# Patient Record
Sex: Female | Born: 1955
Health system: Southern US, Community
[De-identification: ages and names within clinical notes are randomized; demographics above are authoritative.]

## PROBLEM LIST (undated history)

## (undated) DIAGNOSIS — E785 Hyperlipidemia, unspecified: Secondary | ICD-10-CM

## (undated) DIAGNOSIS — M1990 Unspecified osteoarthritis, unspecified site: Secondary | ICD-10-CM

## (undated) DIAGNOSIS — J45909 Unspecified asthma, uncomplicated: Secondary | ICD-10-CM

## (undated) DIAGNOSIS — I10 Essential (primary) hypertension: Secondary | ICD-10-CM

## (undated) DIAGNOSIS — J309 Allergic rhinitis, unspecified: Secondary | ICD-10-CM

## (undated) DIAGNOSIS — K219 Gastro-esophageal reflux disease without esophagitis: Secondary | ICD-10-CM

## (undated) HISTORY — PX: MYOMECTOMY: SHX85

## (undated) HISTORY — DX: Allergic rhinitis, unspecified: J30.9

## (undated) HISTORY — DX: Unspecified asthma, uncomplicated: J45.909

## (undated) HISTORY — DX: Hyperlipidemia, unspecified: E78.5

## (undated) HISTORY — PX: TONSILLECTOMY: SUR1361

## (undated) HISTORY — PX: TEMPOROMANDIBULAR JOINT SURGERY: SHX35

## (undated) HISTORY — PX: APPENDECTOMY: SHX54

## (undated) HISTORY — DX: Unspecified osteoarthritis, unspecified site: M19.90

---

## 1998-09-15 ENCOUNTER — Other Ambulatory Visit: Admission: RE | Admit: 1998-09-15 | Discharge: 1998-09-15 | Payer: Self-pay | Admitting: Obstetrics and Gynecology

## 1998-11-30 ENCOUNTER — Encounter (INDEPENDENT_AMBULATORY_CARE_PROVIDER_SITE_OTHER): Payer: Self-pay | Admitting: Specialist

## 1998-11-30 ENCOUNTER — Other Ambulatory Visit: Admission: RE | Admit: 1998-11-30 | Discharge: 1998-11-30 | Payer: Self-pay | Admitting: Obstetrics and Gynecology

## 1999-11-17 ENCOUNTER — Other Ambulatory Visit: Admission: RE | Admit: 1999-11-17 | Discharge: 1999-11-17 | Payer: Self-pay | Admitting: *Deleted

## 2000-05-28 ENCOUNTER — Encounter: Payer: Self-pay | Admitting: *Deleted

## 2000-05-28 ENCOUNTER — Encounter: Admission: RE | Admit: 2000-05-28 | Discharge: 2000-05-28 | Payer: Self-pay | Admitting: *Deleted

## 2000-10-01 ENCOUNTER — Encounter: Admission: RE | Admit: 2000-10-01 | Discharge: 2000-10-01 | Payer: Self-pay | Admitting: *Deleted

## 2000-10-01 ENCOUNTER — Encounter: Payer: Self-pay | Admitting: *Deleted

## 2000-10-07 ENCOUNTER — Encounter: Admission: RE | Admit: 2000-10-07 | Discharge: 2000-10-07 | Payer: Self-pay | Admitting: *Deleted

## 2000-10-07 ENCOUNTER — Encounter: Payer: Self-pay | Admitting: *Deleted

## 2001-01-03 ENCOUNTER — Other Ambulatory Visit: Admission: RE | Admit: 2001-01-03 | Discharge: 2001-01-03 | Payer: Self-pay | Admitting: Obstetrics and Gynecology

## 2001-07-16 ENCOUNTER — Ambulatory Visit (HOSPITAL_COMMUNITY): Admission: RE | Admit: 2001-07-16 | Discharge: 2001-07-16 | Payer: Self-pay | Admitting: Obstetrics and Gynecology

## 2001-07-16 ENCOUNTER — Encounter (INDEPENDENT_AMBULATORY_CARE_PROVIDER_SITE_OTHER): Payer: Self-pay

## 2002-07-31 ENCOUNTER — Encounter: Admission: RE | Admit: 2002-07-31 | Discharge: 2002-07-31 | Payer: Self-pay | Admitting: Family Medicine

## 2002-07-31 ENCOUNTER — Encounter: Payer: Self-pay | Admitting: Family Medicine

## 2003-07-02 ENCOUNTER — Encounter: Admission: RE | Admit: 2003-07-02 | Discharge: 2003-07-02 | Payer: Self-pay | Admitting: Family Medicine

## 2004-02-22 ENCOUNTER — Encounter: Admission: RE | Admit: 2004-02-22 | Discharge: 2004-02-22 | Payer: Self-pay | Admitting: Family Medicine

## 2004-02-24 ENCOUNTER — Encounter: Admission: RE | Admit: 2004-02-24 | Discharge: 2004-02-24 | Payer: Self-pay | Admitting: Family Medicine

## 2004-05-16 ENCOUNTER — Encounter: Admission: RE | Admit: 2004-05-16 | Discharge: 2004-05-16 | Payer: Self-pay | Admitting: Neurology

## 2004-06-02 ENCOUNTER — Encounter: Admission: RE | Admit: 2004-06-02 | Discharge: 2004-06-02 | Payer: Self-pay | Admitting: Allergy and Immunology

## 2005-04-18 ENCOUNTER — Encounter: Admission: RE | Admit: 2005-04-18 | Discharge: 2005-04-18 | Payer: Self-pay | Admitting: Family Medicine

## 2005-05-16 ENCOUNTER — Encounter: Admission: RE | Admit: 2005-05-16 | Discharge: 2005-05-16 | Payer: Self-pay | Admitting: Family Medicine

## 2006-05-24 ENCOUNTER — Encounter: Admission: RE | Admit: 2006-05-24 | Discharge: 2006-05-24 | Payer: Self-pay | Admitting: Family Medicine

## 2007-10-06 ENCOUNTER — Encounter: Admission: RE | Admit: 2007-10-06 | Discharge: 2007-10-06 | Payer: Self-pay | Admitting: Family Medicine

## 2008-05-17 ENCOUNTER — Encounter: Admission: RE | Admit: 2008-05-17 | Discharge: 2008-05-17 | Payer: Self-pay | Admitting: Orthopaedic Surgery

## 2008-10-14 ENCOUNTER — Other Ambulatory Visit: Admission: RE | Admit: 2008-10-14 | Discharge: 2008-10-14 | Payer: Self-pay | Admitting: Family Medicine

## 2008-11-08 ENCOUNTER — Encounter: Admission: RE | Admit: 2008-11-08 | Discharge: 2008-11-08 | Payer: Self-pay | Admitting: Family Medicine

## 2008-11-08 ENCOUNTER — Ambulatory Visit: Payer: Self-pay | Admitting: Family Medicine

## 2009-06-16 ENCOUNTER — Encounter: Admission: RE | Admit: 2009-06-16 | Discharge: 2009-09-14 | Payer: Self-pay | Admitting: Family Medicine

## 2009-06-25 ENCOUNTER — Emergency Department (HOSPITAL_COMMUNITY): Admission: EM | Admit: 2009-06-25 | Discharge: 2009-06-26 | Payer: Self-pay | Admitting: Emergency Medicine

## 2010-06-10 ENCOUNTER — Encounter: Payer: Self-pay | Admitting: Family Medicine

## 2010-10-06 NOTE — Op Note (Signed)
[facility]  Patient:    Brenda Daniel, Brenda Daniel Visit Number: [id number] MRN: [id number]          Service Type: DSU Location: [facility] Attending Physician:  Morene Antu Dictated by:   Sherry A. Rosalio Macadamia, M.D. Proc. Date: 07/16/01 Admit Date:  07/16/2001                             Operative Report  PREOPERATIVE DIAGNOSIS:       Menorrhagia, submucosal fibroid.  POSTOPERATIVE DIAGNOSIS:      Menorrhagia, submucosal fibroid.  OPERATION:                    D&C hysteroscopy with resectoscope.  SURGEON:                      Sherry A. Rosalio Macadamia, M.D.  ASSISTANT:  ANESTHESIA:                   MAC anesthesia.  COMPLICATIONS:                None.  ESTIMATED BLOOD LOSS:         5 cc.  SORBITOL DIFFERENTIAL:        -170.  INDICATIONS:                  This is a 55 year old G2, P2-0-0-2 woman who has been experiencing heavier menstrual periods every month lasting seven to eight days.  The patient has excessive flow changing a super tampon and pad every one hour.  The patient has history of fibroids in the past. The patient has had a previous D&C for submucosal fibroids in July of 2000.  Ultrasound was performed showing enlarged uterus with multiple fibroids with probable 2 to 3 cm submucosal fibroid present. Because of this, the patient was brought into the operating room for [facility] hysteroscopy with resectoscope.  FINDINGS:                     A 10-week size anteflexed uterus, no adnexal mass, 2 to 3 cm posterior submucosal fibroid.  DESCRIPTION OF PROCEDURE:     The patient was brought into the operating room and given adequate IV sedation. She was placed in the dorsal lithotomy position. Her perineum was washed with Hibiclens.  Bladder was in and out catheterized.  Pelvic examination was performed.  Surgeons gown and gloves were changed.  The patient was draped in a sterile fashion.  Speculum was placed within the vagina.  Vagina was washed  with Hibiclens.  Using 1% Nesacaine, a paracervical block was performed, anterior lip of the cervix was grasped with a single-tooth tenaculum.  Cervix was sounded.  Cervix was dilated with Pratt dilators to a #31. Hysteroscope was introduced into the endometrial cavity.  There was not good flow from the pump and the equipment was reconnected. Once the equipment was working well, adequate visualization was present. Pictures were obtained using double loop right angle resector. Sheets of submucosal fibroid were removed. This was done across the entire posterior wall to remove the whole submucosal fibroid.  During the whole process, bleeders needed to be cauterized in multiple areas.  Once the fibroid was removed completely, all bleeders were cauterized.  Picture was obtained. No further abnormalities could be seen. Surgery was difficult because of the size of the fibroid but was accomplished well.  The hysteroscope and tenaculum were  removed from the vagina.  Speculum was replaced.  One of the tenaculum sites on the cervix continued to bleed despite pressure and Monsels solution, therefore, a 0 Vicryl figure-of-eight stitch was taken with adequate hemostasis present.  All instruments were removed from the vagina. The patient was taken out of the dorsal lithotomy position. She was awakened. She was removed from the operating table to a stretcher in stable condition. Dictated by:   Sherry A. Rosalio Macadamia, M.D. Attending Physician:  Morene Antu DD:  07/16/01 TD:  07/16/01 Job: [id number]

## 2011-03-30 ENCOUNTER — Other Ambulatory Visit: Payer: Self-pay | Admitting: Family Medicine

## 2011-03-30 DIAGNOSIS — Z1231 Encounter for screening mammogram for malignant neoplasm of breast: Secondary | ICD-10-CM

## 2011-04-18 ENCOUNTER — Ambulatory Visit
Admission: RE | Admit: 2011-04-18 | Discharge: 2011-04-18 | Disposition: A | Discharge status: Home / Self Care | Payer: PRIVATE HEALTH INSURANCE | Source: Ambulatory Visit | Attending: Family Medicine | Admitting: Family Medicine

## 2011-04-18 DIAGNOSIS — Z1231 Encounter for screening mammogram for malignant neoplasm of breast: Secondary | ICD-10-CM

## 2011-04-26 ENCOUNTER — Other Ambulatory Visit: Payer: Self-pay | Admitting: Dermatology

## 2013-04-14 ENCOUNTER — Other Ambulatory Visit: Payer: Self-pay | Admitting: Family Medicine

## 2013-04-14 ENCOUNTER — Other Ambulatory Visit (HOSPITAL_COMMUNITY)
Admission: RE | Admit: 2013-04-14 | Discharge: 2013-04-14 | Disposition: A | Discharge status: Home / Self Care | Payer: PRIVATE HEALTH INSURANCE | Source: Ambulatory Visit | Attending: Family Medicine | Admitting: Family Medicine

## 2013-04-14 DIAGNOSIS — Z0000 Encounter for general adult medical examination without abnormal findings: Secondary | ICD-10-CM | POA: Insufficient documentation

## 2013-06-01 ENCOUNTER — Other Ambulatory Visit: Payer: Self-pay | Admitting: Dermatology

## 2014-06-14 ENCOUNTER — Other Ambulatory Visit: Payer: Self-pay

## 2014-06-14 DIAGNOSIS — Z1231 Encounter for screening mammogram for malignant neoplasm of breast: Secondary | ICD-10-CM

## 2014-06-17 ENCOUNTER — Ambulatory Visit: Payer: PRIVATE HEALTH INSURANCE

## 2014-06-21 ENCOUNTER — Ambulatory Visit
Admission: RE | Admit: 2014-06-21 | Discharge: 2014-06-21 | Disposition: A | Discharge status: Home / Self Care | Payer: PRIVATE HEALTH INSURANCE | Source: Ambulatory Visit

## 2014-06-21 DIAGNOSIS — Z1231 Encounter for screening mammogram for malignant neoplasm of breast: Secondary | ICD-10-CM

## 2014-08-09 ENCOUNTER — Other Ambulatory Visit (HOSPITAL_COMMUNITY): Payer: Self-pay | Admitting: Orthopaedic Surgery

## 2014-08-09 DIAGNOSIS — M7989 Other specified soft tissue disorders: Secondary | ICD-10-CM

## 2014-08-09 DIAGNOSIS — M79661 Pain in right lower leg: Secondary | ICD-10-CM

## 2014-08-10 ENCOUNTER — Ambulatory Visit (HOSPITAL_COMMUNITY)
Admission: RE | Admit: 2014-08-10 | Discharge: 2014-08-10 | Disposition: A | Discharge status: Home / Self Care | Payer: PRIVATE HEALTH INSURANCE | Source: Ambulatory Visit | Attending: Cardiovascular Disease | Admitting: Cardiovascular Disease

## 2014-08-10 DIAGNOSIS — M7989 Other specified soft tissue disorders: Secondary | ICD-10-CM | POA: Diagnosis not present

## 2014-08-10 DIAGNOSIS — M79661 Pain in right lower leg: Secondary | ICD-10-CM | POA: Diagnosis not present

## 2014-08-10 NOTE — Progress Notes (Signed)
Right Venous Duplex Completed. No evidence for DVT or SVT. Brianna L Mazza,RVT

## 2014-08-11 ENCOUNTER — Telehealth (HOSPITAL_COMMUNITY): Payer: Self-pay | Admitting: *Deleted

## 2014-08-26 ENCOUNTER — Telehealth (HOSPITAL_COMMUNITY): Payer: Self-pay | Admitting: *Deleted

## 2015-02-03 ENCOUNTER — Encounter (HOSPITAL_COMMUNITY): Payer: Self-pay

## 2015-02-03 ENCOUNTER — Ambulatory Visit (HOSPITAL_COMMUNITY): Admit: 2015-02-03 | Payer: Self-pay | Admitting: Gastroenterology

## 2015-02-03 SURGERY — ESOPHAGOGASTRODUODENOSCOPY (EGD) WITH PROPOFOL
Anesthesia: Monitor Anesthesia Care

## 2015-03-15 ENCOUNTER — Other Ambulatory Visit: Payer: Self-pay | Admitting: Gastroenterology

## 2015-03-15 NOTE — Addendum Note (Signed)
Addended by: Cyncere Ruhe on: 03/15/2015 02:40 PM   Modules accepted: Orders  

## 2015-03-16 ENCOUNTER — Encounter (HOSPITAL_COMMUNITY): Payer: Self-pay

## 2015-03-16 ENCOUNTER — Encounter (HOSPITAL_COMMUNITY)
Admission: RE | Disposition: A | Discharge status: Home / Self Care | Payer: Self-pay | Source: Ambulatory Visit | Attending: Gastroenterology

## 2015-03-16 ENCOUNTER — Ambulatory Visit (HOSPITAL_COMMUNITY)
Admission: RE | Admit: 2015-03-16 | Discharge: 2015-03-16 | Disposition: A | Discharge status: Home / Self Care | Payer: PRIVATE HEALTH INSURANCE | Source: Ambulatory Visit | Attending: Gastroenterology | Admitting: Gastroenterology

## 2015-03-16 DIAGNOSIS — K2960 Other gastritis without bleeding: Secondary | ICD-10-CM | POA: Insufficient documentation

## 2015-03-16 DIAGNOSIS — R05 Cough: Secondary | ICD-10-CM | POA: Insufficient documentation

## 2015-03-16 DIAGNOSIS — R058 Other specified cough: Secondary | ICD-10-CM | POA: Diagnosis present

## 2015-03-16 DIAGNOSIS — K219 Gastro-esophageal reflux disease without esophagitis: Secondary | ICD-10-CM | POA: Insufficient documentation

## 2015-03-16 HISTORY — DX: Gastro-esophageal reflux disease without esophagitis: K21.9

## 2015-03-16 HISTORY — PX: BRAVO PH STUDY: SHX5421

## 2015-03-16 HISTORY — PX: ESOPHAGOGASTRODUODENOSCOPY: SHX5428

## 2015-03-16 HISTORY — DX: Essential (primary) hypertension: I10

## 2015-03-16 SURGERY — EGD (ESOPHAGOGASTRODUODENOSCOPY)
Anesthesia: Moderate Sedation

## 2015-03-16 MED ORDER — MIDAZOLAM HCL 5 MG/ML IJ SOLN
INTRAMUSCULAR | Status: AC
  Filled 2015-03-16: qty 2

## 2015-03-16 MED ORDER — BUTAMBEN-TETRACAINE-BENZOCAINE 2-2-14 % EX AERO
INHALATION_SPRAY | CUTANEOUS | Status: DC | PRN
  Administered 2015-03-16: 2 via TOPICAL

## 2015-03-16 MED ORDER — MIDAZOLAM HCL 10 MG/2ML IJ SOLN
INTRAMUSCULAR | Status: DC | PRN
  Administered 2015-03-16 (×3): 2 mg via INTRAVENOUS
  Administered 2015-03-16: 1 mg via INTRAVENOUS

## 2015-03-16 MED ORDER — FENTANYL CITRATE (PF) 100 MCG/2ML IJ SOLN
INTRAMUSCULAR | Status: DC | PRN
  Administered 2015-03-16 (×3): 25 ug via INTRAVENOUS

## 2015-03-16 MED ORDER — SODIUM CHLORIDE 0.9 % IV SOLN
INTRAVENOUS | Status: DC
Start: 2015-03-16 — End: 2015-03-16
  Administered 2015-03-16: 500 mL via INTRAVENOUS

## 2015-03-16 MED ORDER — DIPHENHYDRAMINE HCL 50 MG/ML IJ SOLN
INTRAMUSCULAR | Status: AC
  Filled 2015-03-16: qty 1

## 2015-03-16 MED ORDER — FENTANYL CITRATE (PF) 100 MCG/2ML IJ SOLN
INTRAMUSCULAR | Status: AC
  Filled 2015-03-16: qty 2

## 2015-03-16 NOTE — Discharge Instructions (Signed)

## 2015-03-16 NOTE — Op Note (Signed)
[facility] [address] [place], [id number]   ENDOSCOPY PROCEDURE REPORT  PATIENT: Brenda Daniel, [name]  MR#: [id number] BIRTHDATE: 1955-12-10 , 59  yrs. old GENDER: female ENDOSCOPIST: Charlott RakesVincent Jong Rickman, MD REFERRED BY: PROCEDURE DATE:  03/16/2015 PROCEDURE:  EGD w/ Bravo capsule placement ASA CLASS:     Class II INDICATIONS:  GERD. MEDICATIONS: Fentanyl 75 mcg IV and Versed 7 mg IV TOPICAL ANESTHETIC: Cetacaine Spray  DESCRIPTION OF PROCEDURE: After the risks benefits and alternatives of the procedure were thoroughly explained, informed consent was obtained.  The Pentax Gastroscope [id number] endoscope was introduced through the mouth and advanced to the second portion of the duodenum , Without limitations.  The instrument was slowly withdrawn as the mucosa was fully examined. Estimated blood loss is zero unless otherwise noted in this procedure report.    Esophagus normal. GEJ 42 cm from the incisors. Scattered erythema in distal stomach c/w antral gastritis. Focal nodular area in antrum (benign-appearing). Duodenal bulb and 2nd portion of the duodenum normal.  Endoscope removed and Bravo capsule catheter advanced to 36 cm from the incisors (6 cm above the GEJ). Bravo capsule deployed in the usual fashion and successful placement confirmed with reinsertion of the endoscope.     Retroflexed views revealed no abnormalities.     The scope was then withdrawn from the patient and the procedure completed.  COMPLICATIONS: There were no immediate complications.  ENDOSCOPIC IMPRESSION:     Antral gastritis o/w normal EGD S/P Bravo capsule placement  RECOMMENDATIONS:     F/U on Bravo results (OFF of PPIs)   eSigned:  Charlott RakesVincent Smokey Melott, MD 03/16/2015 9:53 AM    CC:  CPT CODES: ICD CODES:  The ICD and CPT codes recommended by this software are interpretations from the data that the clinical staff has captured with the software.  The verification of the  translation of this report to the ICD and CPT codes and modifiers is the sole responsibility of the health care institution and practicing physician where this report was generated.  [employer] will not be held responsible for the validity of the ICD and CPT codes included on this report.  AMA assumes no liability for data contained or not contained herein. CPT is a Publishing rights managerregistered trademark of the [employer].  PATIENT NAME:  Brenda Daniel, Seira C MR#: [id number]

## 2015-03-16 NOTE — Interval H&P Note (Signed)
History and Physical Interval Note:  03/16/2015 9:02 AM  Brenda Bernheimatherine C Huntley  has presented today for surgery, with the diagnosis of acid reflux/cough  The various methods of treatment have been discussed with the patient and family. After consideration of risks, benefits and other options for treatment, the patient has consented to  Procedure(s): ESOPHAGOGASTRODUODENOSCOPY (EGD) (N/A) BRAVO PH STUDY (N/A) as a surgical intervention .  The patient's history has been reviewed, patient examined, no change in status, stable for surgery.  I have reviewed the patient's chart and labs.  Questions were answered to the patient's satisfaction.     Fancy Dunkley C.

## 2015-03-16 NOTE — H&P (Signed)
Date of Initial H&P: 03/07/15  History reviewed, patient examined, no change in status, stable for surgery. 

## 2015-03-17 ENCOUNTER — Encounter (HOSPITAL_COMMUNITY): Payer: Self-pay | Admitting: Gastroenterology

## 2015-04-01 ENCOUNTER — Ambulatory Visit (INDEPENDENT_AMBULATORY_CARE_PROVIDER_SITE_OTHER): Payer: PRIVATE HEALTH INSURANCE | Admitting: Internal Medicine

## 2015-04-01 ENCOUNTER — Encounter: Payer: Self-pay | Admitting: *Deleted

## 2015-04-01 VITALS — BP 126/72 | HR 87 | Ht 64.0 in | Wt 193.6 lb

## 2015-04-01 DIAGNOSIS — R05 Cough: Secondary | ICD-10-CM

## 2015-04-01 DIAGNOSIS — R058 Other specified cough: Secondary | ICD-10-CM

## 2015-04-01 MED ORDER — BENZONATATE 200 MG PO CAPS: ORAL_CAPSULE | ORAL | Status: DC

## 2015-04-01 NOTE — Progress Notes (Signed)
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     04/01/2015 1st Brenda Daniel/ [name]   Chief Complaint  Patient presents with  . Advice Only    Refer Dr. Cliffton AstersWhite. C/o dry coughing spells/spasms. When this happens iit causes her to cough for several weeks.   recurrent coughing x 26 years/ does not like to take Nexium / takes maint singulair 5 mg daily and can't tell if it really  helps Trigger seem to be colds,  Brenda SchillingSummers are the best  Coughs worse with laughing/ voice use  Never occurs at hs unless in the midst of a fit. In between flares at present which can happen up to 4-6 x a year   No obvious other patterns in  day to day or daytime variability or assoc  Sob/ex intolerance or  cp or chest tightness, subjective wheeze or overt sinus or hb symptoms. No unusual exp hx or h/o childhood pna/ asthma or knowledge of premature birth.  Sleeping ok without nocturnal  or early am exacerbation  of respiratory  c/o's or need for noct saba. Also denies any obvious fluctuation of symptoms with weather or environmental changes or other aggravating or alleviating factors except as outlined above   Current Medications, Allergies, Complete Past Medical History, Past Surgical History, Family History, and Social History were reviewed in [facility] electronic medical  record.  ROS  The following are not active complaints unless bolded sore throat, dysphagia, dental problems, itching, sneezing,  nasal congestion or excess/ purulent secretions, ear ache,  fever, chills, sweats, unintended wt loss, classically pleuritic or exertional cp, hemoptysis,  orthopnea pnd or leg swelling, presyncope, palpitations, abdominal pain, anorexia, nausea, vomiting, diarrhea  or change in bowel or bladder habits, change in stools or urine, dysuria,hematuria,  rash, arthralgias, visual complaints, headache, numbness, weakness or ataxia or problems with walking or coordination,  change in mood/affect or memory.           Review of Systems     Objective:   Physical Exam    amb pleasant wf with occ vigorous throat clearing in nad  Wt Readings from Last 3 Encounters:  04/01/15 193 lb 9.6 oz (87.816 kg)    Vital signs reviewed   HEENT: nl dentition, turbinates, and oropharynx. Nl external ear canals without cough reflex   NECK :  without JVD/Nodes/TM/ nl carotid upstrokes bilaterally   LUNGS: no acc muscle use, clear to A and P bilaterally without cough on insp or exp maneuvers   CV:  RRR  no s3 or murmur or increase in P2, no edema   ABD:  soft and nontender with nl excursion in the supine position. No bruits or organomegaly, bowel sounds nl  MS:  warm without deformities, calf tenderness, cyanosis or clubbing  SKIN: warm and  dry without lesions    NEURO:  alert, approp, no deficits      Assessment:

## 2015-04-01 NOTE — Patient Instructions (Signed)
The key to effective treatment for your cough is eliminating the non-stop cycle of cough you're stuck in long enough to let your airway heal completely and then see if there is anything still making you cough once you stop the cough suppression, but this should take no more than 5 days to figure out  First take delsym two tsp every 12 hours and supplement if needed with tessilon pearls 200mg  every 4 hours  As needed   For drainage / throat tickle try take CHLORPHENIRAMINE  4 mg - take one every 4 hours as needed - available over the counter- may cause drowsiness so start with just a bedtime dose or two and see how you tolerate it before trying in daytime     Nexium 40 mg Take 30-60 min before first meal of the day and Pepcid 20 mg(ranitidine 150 or 300 )  at bedtime plus chlorpheniramine 4 mg x 2 at bedtime (both available over the counter)  until cough is completely gone for at least a week without the need for cough suppression  GERD (REFLUX)  is an extremely common cause of respiratory symptoms, many times with no significant heartburn at all.    It can be treated with medication, but also with lifestyle changes including avoidance of late meals, excessive alcohol, smoking cessation, and avoid fatty foods, chocolate, peppermint, colas, red wine, and acidic juices such as orange juice.  NO MINT OR MENTHOL PRODUCTS SO NO COUGH DROPS  USE HARD CANDY INSTEAD (jolley ranchers or Stover's or Lifesavers (all available in sugarless versions) NO OIL BASED VITAMINS - use powdered substitutes  Wt loss would really help your problem  Pulmonary follow up is as needed

## 2015-04-02 NOTE — Assessment & Plan Note (Signed)
Complicated by hbp/ hyperlipidemia  Body mass index is 33.21 kg/(m^2).  No results found for: TSH   Contributing to gerd tendency especially when starts coughing  >>> reviewed the need and the process to achieve and maintain neg calorie balance > defer f/u primary care including intermittently monitoring thyroid status

## 2015-04-02 NOTE — Assessment & Plan Note (Signed)
The most common causes of chronic cough in immunocompetent adults include the following: upper airway cough syndrome (UACS), previously referred to as postnasal drip syndrome (PNDS), which is caused by variety of rhinosinus conditions; (2) asthma; (3) GERD; (4) chronic bronchitis from cigarette smoking or other inhaled environmental irritants; (5) nonasthmatic eosinophilic bronchitis; and (6) bronchiectasis.   These conditions, singly or in combination, have accounted for up to 94% of the causes of chronic cough in prospective studies.   Other conditions have constituted no >6% of the causes in prospective studies These have included bronchogenic carcinoma, chronic interstitial pneumonia, sarcoidosis, left ventricular failure, ACEI-induced cough, and aspiration from a condition associated with pharyngeal dysfunction.    Chronic cough is often simultaneously caused by more than one condition. A single cause has been found from 38 to 82% of the time, multiple causes from 18 to 62%. Multiply caused cough has been the result of three diseases up to 42% of the time.       Based on hx and exam, this is most likely:  Classic Upper airway cough syndrome, so named because it's frequently impossible to sort out how much is  CR/sinusitis with freq throat clearing (which can be related to primary GERD)   vs  causing  secondary (" extra esophageal")  GERD from wide swings in gastric pressure that occur with throat clearing, often  promoting self use of mint and menthol lozenges that reduce the lower esophageal sphincter tone and exacerbate the problem further in a cyclical fashion.   These are the same pts (now being labeled as having "irritable larynx syndrome" by some cough centers) who not infrequently have a history of having failed to tolerate ace inhibitors,  dry powder inhalers or biphosphonates or report having atypical reflux symptoms that don't respond to standard doses of PPI , and are easily confused as  having aecopd or asthma flares by even experienced allergists/ pulmonologists.      I had an extended discussion with the patient reviewing all relevant studies completed to date and  lasting 35 min  Explained the natural history of uri and why it's necessary in patients at risk to treat GERD aggressively - at least  short term -   to reduce risk of evolving cyclical cough initially  triggered by epithelial injury and a heightened sensitivty to the effects of any upper airway irritants,  most importantly acid - related - then perpetuated by epithelial injury related to the cough itself as the upper airway collapses on itself.  That is, the more sensitive the epithelium becomes once it is damaged by the virus, the more the ensuing irritability> the more the cough, the more the secondary reflux (especially in those prone to reflux) the more the irritation of the sensitive mucosa and so on in a  Classic cyclical pattern.    She should be able to eliminate cyclical coughing without prednisone if she treats it early with appropriate treatment which should include Delsym, Tessalon, maximum acid suppression and first generation antihistamines. If these don't work a very short course of prednisone should suffice to eliminate cycle.  Pulmonary f/u can be prn

## 2015-04-19 ENCOUNTER — Other Ambulatory Visit: Payer: Self-pay

## 2015-04-19 MED ORDER — RANITIDINE HCL 300 MG PO TABS: 300.0000 mg | ORAL_TABLET | Freq: Every day | ORAL | Status: DC

## 2015-05-31 ENCOUNTER — Encounter: Payer: Self-pay | Admitting: Allergy and Immunology

## 2015-05-31 ENCOUNTER — Ambulatory Visit (INDEPENDENT_AMBULATORY_CARE_PROVIDER_SITE_OTHER): Payer: No Typology Code available for payment source | Admitting: Allergy and Immunology

## 2015-05-31 VITALS — BP 112/82 | HR 68 | Resp 16

## 2015-05-31 DIAGNOSIS — J387 Other diseases of larynx: Secondary | ICD-10-CM

## 2015-05-31 DIAGNOSIS — H1010 Acute atopic conjunctivitis, unspecified eye: Secondary | ICD-10-CM

## 2015-05-31 DIAGNOSIS — J309 Allergic rhinitis, unspecified: Secondary | ICD-10-CM

## 2015-05-31 DIAGNOSIS — J4520 Mild intermittent asthma, uncomplicated: Secondary | ICD-10-CM

## 2015-05-31 DIAGNOSIS — K219 Gastro-esophageal reflux disease without esophagitis: Secondary | ICD-10-CM

## 2015-05-31 NOTE — Patient Instructions (Addendum)
  1. Use nasal saline multiple times per day during upper respiratory tract infection  2. Treat reflux with Nexium 40 mg once a day and ranitidine 300 mg in the evening  3. Action plan for respiratory flare:   A. increase Nexium to 40 mg twice a day  B. start Qvar 80 2 inhalations twice a day  C. use ProAir HFA 2 puffs every 4-6 hours if needed  4. Continue Flonase one-2 sprays each nostril one time per day and montelukast 10 mg one tablet one time per day and antihistamine if needed  5. Return to clinic in 6 months or earlier if problem

## 2015-05-31 NOTE — Progress Notes (Signed)
[facility] [place]  Follow-up Note  Referring Provider: Laurann MontanaWhite, Cynthia, MD Primary Provider: [name],[name], MD Date of Office Visit: 05/31/2015  Subjective:   Brenda BernheimCatherine C Daniel is a 60 y.o. female who [facility] in re-evaluation of the following:  HPI Comments:   Brenda EvansCatherine returns to this clinic on 01/10/017 in reevaluation of her asthma, allergic rhinitis, and lpr. She was doing quite well up until around Thanksgiving. At that point in time she developed a cough and she restarted her Qvar until Chlor-Trimeton and some cough syrup and she was improving until she developed an upper respiratory tract infection over the course of the past several weeks. She had nasal congestion and head fullness and no anosmia and no ugly nasal discharge. She was treated with amoxicillin and some prednisone. Fortunately, her cough did resolve but unfortunately, she still continues to have nasal congestion and head fullness. Prior to this episode starting in Thanksgiving she was doing very well with very little problem revolving around her respiratory tract as long she continued to treat reflux on a consistent basis and also treated her allergic rhinitis consistently. She does not use Qvar on a consistent basis and rarely uses a short acting bronchodilator.   Current Outpatient Prescriptions on File Prior to Visit  Medication Sig Dispense Refill  . fluticasone (FLONASE) 50 MCG/ACT nasal spray Place 1 spray into both nostrils daily.    . hydrochlorothiazide (HYDRODIURIL) 25 MG tablet Take 12.5 mg by mouth daily.  1  . losartan-hydrochlorothiazide (HYZAAR) 100-25 MG tablet Take 1 tablet by mouth daily.  1  . montelukast (SINGULAIR) 10 MG tablet 1/2 tablet daily    . ranitidine (ZANTAC) 300 MG tablet Take 1 tablet (300 mg total) by mouth at bedtime. 30 tablet 5  . traZODone (DESYREL) 50 MG tablet Take 25 mg by mouth at bedtime.    .  fexofenadine (ALLEGRA) 180 MG tablet Reported on 05/31/2015     No current facility-administered medications on file prior to visit.    No orders of the defined types were placed in this encounter.    Past Medical History  Diagnosis Date  . Hypertension   . GERD (gastroesophageal reflux disease)   . Asthma     per Dr. Lucie LeatherKozlow  . DJD (degenerative joint disease)   . Hyperlipidemia   . Allergic rhinitis     per Dr. Lucie LeatherKozlow    Past Surgical History  Procedure Laterality Date  . Tonsillectomy    . Appendectomy    . Temporomandibular joint surgery Right   . Myomectomy    . Esophagogastroduodenoscopy N/A 03/16/2015    Procedure: ESOPHAGOGASTRODUODENOSCOPY (EGD);  Surgeon: Charlott RakesVincent Schooler, MD;  [facility];  Service: Endoscopy;  Laterality: N/A;  . Bravo ph study N/A 03/16/2015    Procedure: BRAVO PH STUDY;  Surgeon: Charlott RakesVincent Schooler, MD;  Location: [facility];  Service: Endoscopy;  Laterality: N/A;  . Cesarean section      Allergies  Allergen Reactions  . Advil [Ibuprofen]     dizzy  . Niacin And Related     dizzy  . Red Yeast Rice [Cholestin]     dizzy    Review of systems negative except as noted in HPI / PMHx or noted below:  Review of Systems  Constitutional: Negative.   HENT: Negative.   Eyes: Negative.   Respiratory: Negative.   Cardiovascular: Negative.   Gastrointestinal: Negative.   Genitourinary: Negative.  Musculoskeletal: Negative.   Skin: Negative.   Neurological: Negative.   Endo/Heme/Allergies: Negative.   Psychiatric/Behavioral: Negative.      Objective:   Filed Vitals:   05/31/15 1751  BP: 112/82  Pulse: 68  Resp: 16          Physical Exam  Constitutional: She is well-developed, well-nourished, and in no distress. No distress.  Nasal voice  HENT:  Head: Normocephalic.  Right Ear: Tympanic membrane, external ear and ear canal normal.  Left Ear: Tympanic membrane, external ear and ear canal normal.  Nose: Nose normal.  No mucosal edema or rhinorrhea.  Mouth/Throat: Uvula is midline, oropharynx is clear and moist and mucous membranes are normal. No oropharyngeal exudate.  Eyes: Conjunctivae are normal.  Neck: Trachea normal. No tracheal tenderness present. No tracheal deviation present. No thyromegaly present.  Cardiovascular: Normal rate, regular rhythm, S1 normal, S2 normal and normal heart sounds.   No murmur heard. Pulmonary/Chest: Breath sounds normal. No stridor. No respiratory distress. She has no wheezes. She has no rales.  Musculoskeletal: She exhibits no edema.  Lymphadenopathy:       Head (right side): No tonsillar adenopathy present.       Head (left side): No tonsillar adenopathy present.    She has no cervical adenopathy.    She has no axillary adenopathy.  Neurological: She is alert. Gait normal.  Skin: No rash noted. She is not diaphoretic. No erythema. Nails show no clubbing.  Psychiatric: Mood and affect normal.    Diagnostics:    Spirometry was performed and demonstrated an FEV1 of 2.26 at 91 % of predicted.  The patient had an Asthma Control Test with the following results:  .    Assessment and Plan:   1. Mild intermittent asthma, uncomplicated   2. LPRD (laryngopharyngeal reflux disease)   3. Allergic rhinoconjunctivitis      1. Use nasal saline multiple times per day during upper respiratory tract infection  2. Treat reflux with Nexium 40 mg once a day and ranitidine 300 mg in the evening  3. Action plan for respiratory flare:   A. increase Nexium to 40 mg twice a day  B. start Qvar 80 2 inhalations twice a day  C. use ProAir HFA 2 puffs every 4-6 hours if needed  4. Continue Flonase one-2 sprays each nostril one time per day and montelukast 10 mg one tablet one time per day and antihistamine if needed  5. Return to clinic in 6 months or earlier if problem  Certainly Brenda Daniel appears to have an upper respiratory tract infection most likely of viral etiology and  we'll wait an additional week or so before committing her to any additional antibiotics at this point in time. This infection did appear to set off her lung inflammation and she was pretty good about restarting her action plan which is noted above. We'll assume she'll do well with the plan mentioned above and we'll see her back in this clinic in a possibly 6 months or earlier if there is a problem. She appears have a very good understanding of her disease state and when to use her medications appropriately.  Laurette Schimke, MD [facility]

## 2015-06-01 DIAGNOSIS — J309 Allergic rhinitis, unspecified: Secondary | ICD-10-CM

## 2015-06-01 DIAGNOSIS — H1010 Acute atopic conjunctivitis, unspecified eye: Secondary | ICD-10-CM | POA: Insufficient documentation

## 2015-06-01 DIAGNOSIS — K219 Gastro-esophageal reflux disease without esophagitis: Secondary | ICD-10-CM | POA: Insufficient documentation

## 2015-06-01 DIAGNOSIS — J4521 Mild intermittent asthma with (acute) exacerbation: Secondary | ICD-10-CM | POA: Insufficient documentation

## 2015-07-04 ENCOUNTER — Other Ambulatory Visit: Payer: Self-pay | Admitting: Neurology

## 2015-07-04 MED ORDER — MONTELUKAST SODIUM 10 MG PO TABS: 10.0000 mg | ORAL_TABLET | Freq: Every day | ORAL | Status: DC

## 2015-08-23 ENCOUNTER — Telehealth: Payer: Self-pay

## 2015-08-23 ENCOUNTER — Other Ambulatory Visit: Payer: Self-pay

## 2015-08-23 MED ORDER — BUDESONIDE 180 MCG/ACT IN AEPB: 2.0000 | INHALATION_SPRAY | Freq: Two times a day (BID) | RESPIRATORY_TRACT | Status: DC

## 2015-08-23 NOTE — Telephone Encounter (Signed)
Please advise 

## 2015-08-23 NOTE — Telephone Encounter (Signed)
Sent in pulmicort

## 2015-08-23 NOTE — Telephone Encounter (Signed)
Patient is on qvar and would like to switch back to Pulmicort. Qvar is to expensive and she found a Pulmicort she use to use and seems to work great.   [facility]  05/31/2015 Kozlow

## 2015-08-23 NOTE — Telephone Encounter (Signed)
Please have patient obtain Pulmicort two inhalations two times per day.

## 2016-04-23 ENCOUNTER — Ambulatory Visit (INDEPENDENT_AMBULATORY_CARE_PROVIDER_SITE_OTHER): Payer: No Typology Code available for payment source

## 2016-04-23 ENCOUNTER — Ambulatory Visit (INDEPENDENT_AMBULATORY_CARE_PROVIDER_SITE_OTHER): Payer: No Typology Code available for payment source | Admitting: Orthopaedic Surgery

## 2016-04-23 ENCOUNTER — Encounter (INDEPENDENT_AMBULATORY_CARE_PROVIDER_SITE_OTHER): Payer: Self-pay | Admitting: Orthopaedic Surgery

## 2016-04-23 VITALS — BP 125/89 | HR 86 | Resp 14 | Ht 64.0 in | Wt 190.0 lb

## 2016-04-23 DIAGNOSIS — M79602 Pain in left arm: Secondary | ICD-10-CM

## 2016-04-23 DIAGNOSIS — M25552 Pain in left hip: Secondary | ICD-10-CM | POA: Diagnosis not present

## 2016-04-23 DIAGNOSIS — M79601 Pain in right arm: Secondary | ICD-10-CM

## 2016-04-23 MED ORDER — DICLOFENAC SODIUM 1 % TD GEL: 2.0000 g | Freq: Four times a day (QID) | TRANSDERMAL | 1 refills | Status: DC

## 2016-04-23 MED ORDER — CYCLOBENZAPRINE HCL 5 MG PO TABS: ORAL_TABLET | ORAL | 1 refills | Status: DC

## 2016-04-23 NOTE — Progress Notes (Signed)
Office Visit Note   Patient: Brenda BernheimCatherine C Daniel           Date of Birth: December 21, 1955           MRN: [id number] Visit Date: 04/23/2016              Requested by: [name], MD [phone] [address] , [place] [id number] PCP: [name],[name], MD   Assessment & Plan: Visit Diagnoses: No diagnosis found. Left hip pain appears to be musculoligamentous with negative x-rays. She may continue with exercises as tolerated. We could always consider an MRI scan of her pelvis.  Symptoms are both upper extremities are consistent with carpal tunnel syndrome. She did have prior EMGs and nerve conduction studies in 2005 consistent with moderate left carpal tunnel syndrome. These will be repeated for both upper extremities  Plan: We'll plan to see back after EMGs and nerve conduction studies.  Follow-Up Instructions: No Follow-up on file.   Orders:  No orders of the defined types were placed in this encounter.  No orders of the defined types were placed in this encounter.     Procedures: No procedures performed   Clinical Data: No additional findings.   Subjective: No chief complaint on file.   BIL wrist/hand/finger  Pain, chronic pain for years.Marland Kitchen.Marland Kitchen.The patient left the office before the visit was finished. Greater than Right Tingling, gripping and weight bearing hurt the most (6 months)    Chronic Left Hip pain started years ago, left hip pain has been severe 3 weeks      Review of Systems   Objective: Vital Signs: There were no vitals taken for this visit.  Physical Exam  Ortho Exam exam of the left hip reveals no pain with internal or external rotation. There was no pain with hyperflexion. There is no discomfort about the lateral aspect of her hip. She denied groin pain. Pain is presently asymptomatic but based on her description might be a chronic strain of one of the hip flexor muscles.  There is a positive Phalen's test particularly on the right side with  minimal Tinel's at both wrists. There is good grip and release. Minimal tenderness along the first dorsal extensor compartment of both wrists. There is a prior diagnosis of de Quervain's. There was also mild tenderness at the base of both thumbs. No hand or finger swelling. Neurovascular exam was intact. Good pulses without evidence of vascular compromise.  Pain is range of motion of cervical spine with no reproduction of discomfort with any motion. No pain about either elbow and specifically over the ulnar nerves. Testing for thoracic outlet was minimally positive  Specialty Comments:  No specialty comments available.  Imaging: No results found.   PMFS History: Patient Active Problem List   Diagnosis Date Noted  . Mild intermittent asthma 06/01/2015  . LPRD (laryngopharyngeal reflux disease) 06/01/2015  . Allergic rhinoconjunctivitis 06/01/2015  . Morbid obesity (HCC) 04/02/2015  . GERD (gastroesophageal reflux disease) 03/16/2015  . Upper airway cough syndrome 03/16/2015   Past Medical History:  Diagnosis Date  . Allergic rhinitis    per Dr. Lucie LeatherKozlow  . Asthma    per Dr. Lucie LeatherKozlow  . DJD (degenerative joint disease)   . GERD (gastroesophageal reflux disease)   . Hyperlipidemia   . Hypertension     Family History  Problem Relation Age of Onset  . Heart disease Father   . Allergies Sister   . Eczema Sister   . Asthma Sister   .  Hypertension      x 4 siblings    Past Surgical History:  Procedure Laterality Date  . APPENDECTOMY    . BRAVO PH STUDY N/A 03/16/2015   Procedure: BRAVO PH STUDY;  Surgeon: Charlott RakesVincent Schooler, MD;  Location: [facility];  Service: Endoscopy;  Laterality: N/A;  . CESAREAN SECTION    . ESOPHAGOGASTRODUODENOSCOPY N/A 03/16/2015   Procedure: ESOPHAGOGASTRODUODENOSCOPY (EGD);  Surgeon: Charlott RakesVincent Schooler, MD;  [facility];  Service: Endoscopy;  Laterality: N/A;  . MYOMECTOMY    . TEMPOROMANDIBULAR JOINT SURGERY Right   . TONSILLECTOMY      Social History   Occupational History  . judge    Social History Main Topics  . Smoking status: Never Smoker  . Smokeless tobacco: Not on file  . Alcohol use 0.0 oz/week     Comment: glass a wine a day  . Drug use: No  . Sexual activity: Yes

## 2016-04-25 ENCOUNTER — Other Ambulatory Visit: Payer: Self-pay | Admitting: Allergy and Immunology

## 2016-05-10 ENCOUNTER — Ambulatory Visit (INDEPENDENT_AMBULATORY_CARE_PROVIDER_SITE_OTHER): Payer: No Typology Code available for payment source | Admitting: Physical Medicine and Rehabilitation

## 2016-05-10 ENCOUNTER — Encounter (INDEPENDENT_AMBULATORY_CARE_PROVIDER_SITE_OTHER): Payer: Self-pay | Admitting: Physical Medicine and Rehabilitation

## 2016-05-10 DIAGNOSIS — R202 Paresthesia of skin: Secondary | ICD-10-CM

## 2016-05-10 NOTE — Procedures (Signed)
EMG & NCV Findings: Evaluation of the left median motor nerve showed prolonged distal onset latency (5.2 ms), reduced amplitude (3.5 mV), and decreased conduction velocity (Elbow-Wrist, 49 m/s).  The right median motor nerve showed prolonged distal onset latency (5.6 ms) and reduced amplitude (4.9 mV).  The left median (across palm) sensory nerve showed prolonged distal peak latency (Wrist, 4.3 ms), reduced amplitude (8.9 V), and prolonged distal peak latency (Palm, 2.2 ms).  The right median (across palm) sensory nerve showed prolonged distal peak latency (Wrist, 5.5 ms) and prolonged distal peak latency (Palm, 4.4 ms).  All remaining nerves (as indicated in the following tables) were within normal limits.  Left vs. Right side comparison data for the median motor nerve indicates abnormal L-R velocity difference (Elbow-Wrist, 49 m/s).  All remaining left vs. right side differences were within normal limits.    All examined muscles (as indicated in the following table) showed no evidence of electrical instability.    Impression: The above electrodiagnostic study is ABNORMAL and reveals evidence of:  1.  A moderate to severe right median nerve entrapment at the wrist (carpal tunnel syndrome) affecting sensory and motor components.   2.  A moderate to severe left median nerve entrapment at the wrist (carpal tunnel syndrome) affecting sensory and motor components.  There is no significant electrodiagnostic evidence of any other focal nerve entrapment, brachial plexopathy or cervical radiculopathy.   Recommendations: 1.  Follow-up with referring physician. 2.  Continue current management of symptoms. 3.  Suggest surgical evaluation. Careful clinical correlation with symptoms is paramount. She seems to have some symptoms in her not exactly consistent with just carpal tunnel syndrome. She does have osteoarthritis as well.   Nerve Conduction Studies Anti Sensory Summary Table   Stim Site NR Peak (ms)  Norm Peak (ms) P-T Amp (V) Norm P-T Amp Site1 Site2 Delta-P (ms) Dist (cm) Vel (m/s) Norm Vel (m/s)  Left Median Acr Palm Anti Sensory (2nd Digit)  32.8C  Wrist    *4.3 <3.6 *8.9 >10 Wrist Palm 2.1 0.0    Palm    *2.2 <2.0 19.3         Right Median Acr Palm Anti Sensory (2nd Digit)  32.3C  Wrist    *5.5 <3.6 13.9 >10 Wrist Palm 1.1 0.0    Palm    *4.4 <2.0 19.5         Left Radial Anti Sensory (Base 1st Digit)  32.7C  Wrist    2.0 <3.1 33.5  Wrist Base 1st Digit 2.0 0.0    Right Radial Anti Sensory (Base 1st Digit)  31.7C  Wrist    1.9 <3.1 24.5  Wrist Base 1st Digit 1.9 0.0    Left Ulnar Anti Sensory (5th Digit)  33C  Wrist    2.9 <3.7 31.7 >15.0 Wrist 5th Digit 2.9 14.0 48 >38  Right Ulnar Anti Sensory (5th Digit)  32C  Wrist    3.1 <3.7 20.5 >15.0 Wrist 5th Digit 3.1 14.0 45 >38   Motor Summary Table   Stim Site NR Onset (ms) Norm Onset (ms) O-P Amp (mV) Norm O-P Amp Site1 Site2 Delta-0 (ms) Dist (cm) Vel (m/s) Norm Vel (m/s)  Left Median Motor (Abd Poll Brev)  33C  Wrist    *5.2 <4.2 *3.5 >5 Elbow Wrist 3.9 19.3 *49 >50  Elbow    9.1  4.1         Right Median Motor (Abd Poll Brev)  31.6C  Wrist    *5.6 <4.2 *  4.9 >5 Elbow Wrist 2.1 20.5 98 >50  Elbow    7.7  5.5         Left Ulnar Motor (Abd Dig Min)  33.1C  Wrist    2.9 <4.2 8.0 >3 B Elbow Wrist 2.8 19.0 68 >53  B Elbow    5.7  6.5  A Elbow B Elbow 1.4 10.0 71 >53  A Elbow    7.1  6.1         Right Ulnar Motor (Abd Dig Min)  31.9C  Wrist    2.7 <4.2 7.9 >3 B Elbow Wrist 3.0 18.5 62 >53  B Elbow    5.7  7.4  A Elbow B Elbow 1.6 10.0 63 >53  A Elbow    7.3  6.9          EMG   Side Muscle Nerve Root Ins Act Fibs Psw Amp Dur Poly Recrt Int Dennie BiblePat Comment  Right Abd Poll Brev Median C8-T1 Nml Nml Nml Nml Nml 0 Nml Nml   Right 1stDorInt Ulnar C8-T1 Nml Nml Nml Nml Nml 0 Nml Nml   Right PronatorTeres Median C6-7 Nml Nml Nml Nml Nml 0 Nml Nml   Right Biceps Musculocut C5-6 Nml Nml Nml Nml Nml 0 Nml Nml   Right Deltoid  Axillary C5-6 Nml Nml Nml Nml Nml 0 Nml Nml     Nerve Conduction Studies Anti Sensory Left/Right Comparison   Stim Site L Lat (ms) R Lat (ms) L-R Lat (ms) L Amp (V) R Amp (V) L-R Amp (%) Site1 Site2 L Vel (m/s) R Vel (m/s) L-R Vel (m/s)  Median Acr Palm Anti Sensory (2nd Digit)  32.8C  Wrist *4.3 *5.5 1.2 *8.9 13.9 36.0 Wrist Palm     Palm *2.2 *4.4 2.2 19.3 19.5 1.0       Radial Anti Sensory (Base 1st Digit)  32.7C  Wrist 2.0 1.9 0.1 33.5 24.5 26.9 Wrist Base 1st Digit     Ulnar Anti Sensory (5th Digit)  33C  Wrist 2.9 3.1 0.2 31.7 20.5 35.3 Wrist 5th Digit 48 45 3   Motor Left/Right Comparison   Stim Site L Lat (ms) R Lat (ms) L-R Lat (ms) L Amp (mV) R Amp (mV) L-R Amp (%) Site1 Site2 L Vel (m/s) R Vel (m/s) L-R Vel (m/s)  Median Motor (Abd Poll Brev)  33C  Wrist *5.2 *5.6 0.4 *3.5 *4.9 28.6 Elbow Wrist *49 98 *49  Elbow 9.1 7.7 1.4 4.1 5.5 25.5       Ulnar Motor (Abd Dig Min)  33.1C  Wrist 2.9 2.7 0.2 8.0 7.9 1.2 B Elbow Wrist 68 62 6  B Elbow 5.7 5.7 0.0 6.5 7.4 12.2 A Elbow B Elbow 71 63 8  A Elbow 7.1 7.3 0.2 6.1 6.9 11.6

## 2016-05-10 NOTE — Progress Notes (Signed)
Brenda Daniel - 60 y.o. female MRN [id number]  Date of birth: 1955/06/27  Office Visit Note: Visit Date: 05/10/2016 PCP: Brenda BradfordWHITE,[name], MD Referred by: Laurann MontanaWhite, Cynthia, MD  Subjective: Chief Complaint  Patient presents with  . Right Hand - Pain  . Left Hand - Pain   HPI: [name] is a 60 year old female with chronic worsening pain, itching, tingling in both hands. Comes and goes. Symptoms are in different areas at different times. Itching bilateral palms. Off and on for decades. Getting worse over last 4-5 months. He has a prior electrodiagnostic study done several years ago reported by Dr. Cleophas DunkerWhitfield is moderate median neuropathy bilaterally. She has not had carpal tunnel release. She does get worsening with nighttime symptoms and driving and holding her phone. She also gets symptoms with opening jars and turning. She does a lot of yoga and a lot of positions where she has extension of her hands seems to worsen the symptoms as well. She does not note paresthesias in particular fingers however.    Bilateral Upper Extremity Right hand dominant  ROS Otherwise per HPI.  Assessment & Plan: Visit Diagnoses:  1. Paresthesia of skin     Plan: Findings:  Impression: The above electrodiagnostic study is ABNORMAL and reveals evidence of:  1.  A moderate to severe right median nerve entrapment at the wrist (carpal tunnel syndrome) affecting sensory and motor components.   2.  A moderate to severe left median nerve entrapment at the wrist (carpal tunnel syndrome) affecting sensory and motor components.  There is no significant electrodiagnostic evidence of any other focal nerve entrapment, brachial plexopathy or cervical radiculopathy.   Recommendations: 1.  Follow-up with referring physician. 2.  Continue current management of symptoms. 3.  Suggest surgical evaluation. Careful clinical correlation with symptoms is paramount. She seems to have some symptoms in her not exactly  consistent with just carpal tunnel syndrome. She does have osteoarthritis as well.     Meds & Orders: No orders of the defined types were placed in this encounter.   Orders Placed This Encounter  Procedures  . NCV with EMG (electromyography)    Follow-up: Return for scheduled follow up with Dr. Cleophas DunkerWhitfield.   Procedures: No procedures performed  EMG & NCV Findings: Evaluation of the left median motor nerve showed prolonged distal onset latency (5.2 ms), reduced amplitude (3.5 mV), and decreased conduction velocity (Elbow-Wrist, 49 m/Daniel).  The right median motor nerve showed prolonged distal onset latency (5.6 ms) and reduced amplitude (4.9 mV).  The left median (across palm) sensory nerve showed prolonged distal peak latency (Wrist, 4.3 ms), reduced amplitude (8.9 V), and prolonged distal peak latency (Palm, 2.2 ms).  The right median (across palm) sensory nerve showed prolonged distal peak latency (Wrist, 5.5 ms) and prolonged distal peak latency (Palm, 4.4 ms).  All remaining nerves (as indicated in the following tables) were within normal limits.  Left vs. Right side comparison data for the median motor nerve indicates abnormal L-R velocity difference (Elbow-Wrist, 49 m/Daniel).  All remaining left vs. right side differences were within normal limits.    All examined muscles (as indicated in the following table) showed no evidence of electrical instability.    Impression: The above electrodiagnostic study is ABNORMAL and reveals evidence of:  1.  A moderate to severe right median nerve entrapment at the wrist (carpal tunnel syndrome) affecting sensory and motor components.   2.  A moderate to severe left median nerve entrapment at the wrist (carpal tunnel  syndrome) affecting sensory and motor components.  There is no significant electrodiagnostic evidence of any other focal nerve entrapment, brachial plexopathy or cervical radiculopathy.   Recommendations: 1.  Follow-up with referring  physician. 2.  Continue current management of symptoms. 3.  Suggest surgical evaluation. Careful clinical correlation with symptoms is paramount. She seems to have some symptoms in her not exactly consistent with just carpal tunnel syndrome. She does have osteoarthritis as well.   Nerve Conduction Studies Anti Sensory Summary Table   Stim Site NR Peak (ms) Norm Peak (ms) P-T Amp (V) Norm P-T Amp Site1 Site2 Delta-P (ms) Dist (cm) Vel (m/Daniel) Norm Vel (m/Daniel)  Left Median Acr Palm Anti Sensory (2nd Digit)  32.8C  Wrist    *4.3 <3.6 *8.9 >10 Wrist Palm 2.1 0.0    Palm    *2.2 <2.0 19.3         Right Median Acr Palm Anti Sensory (2nd Digit)  32.3C  Wrist    *5.5 <3.6 13.9 >10 Wrist Palm 1.1 0.0    Palm    *4.4 <2.0 19.5         Left Radial Anti Sensory (Base 1st Digit)  32.7C  Wrist    2.0 <3.1 33.5  Wrist Base 1st Digit 2.0 0.0    Right Radial Anti Sensory (Base 1st Digit)  31.7C  Wrist    1.9 <3.1 24.5  Wrist Base 1st Digit 1.9 0.0    Left Ulnar Anti Sensory (5th Digit)  33C  Wrist    2.9 <3.7 31.7 >15.0 Wrist 5th Digit 2.9 14.0 48 >38  Right Ulnar Anti Sensory (5th Digit)  32C  Wrist    3.1 <3.7 20.5 >15.0 Wrist 5th Digit 3.1 14.0 45 >38   Motor Summary Table   Stim Site NR Onset (ms) Norm Onset (ms) O-P Amp (mV) Norm O-P Amp Site1 Site2 Delta-0 (ms) Dist (cm) Vel (m/Daniel) Norm Vel (m/Daniel)  Left Median Motor (Abd Poll Brev)  33C  Wrist    *5.2 <4.2 *3.5 >5 Elbow Wrist 3.9 19.3 *49 >50  Elbow    9.1  4.1         Right Median Motor (Abd Poll Brev)  31.6C  Wrist    *5.6 <4.2 *4.9 >5 Elbow Wrist 2.1 20.5 98 >50  Elbow    7.7  5.5         Left Ulnar Motor (Abd Dig Min)  33.1C  Wrist    2.9 <4.2 8.0 >3 B Elbow Wrist 2.8 19.0 68 >53  B Elbow    5.7  6.5  A Elbow B Elbow 1.4 10.0 71 >53  A Elbow    7.1  6.1         Right Ulnar Motor (Abd Dig Min)  31.9C  Wrist    2.7 <4.2 7.9 >3 B Elbow Wrist 3.0 18.5 62 >53  B Elbow    5.7  7.4  A Elbow B Elbow 1.6 10.0 63 >53  A Elbow    7.3  6.9           EMG   Side Muscle Nerve Root Ins Act Fibs Psw Amp Dur Poly Recrt Int Dennie BiblePat Comment  Right Abd Poll Brev Median C8-T1 Nml Nml Nml Nml Nml 0 Nml Nml   Right 1stDorInt Ulnar C8-T1 Nml Nml Nml Nml Nml 0 Nml Nml   Right PronatorTeres Median C6-7 Nml Nml Nml Nml Nml 0 Nml Nml   Right Biceps Musculocut C5-6 Nml Nml Nml Nml Nml  0 Nml Nml   Right Deltoid Axillary C5-6 Nml Nml Nml Nml Nml 0 Nml Nml     Nerve Conduction Studies Anti Sensory Left/Right Comparison   Stim Site L Lat (ms) R Lat (ms) L-R Lat (ms) L Amp (V) R Amp (V) L-R Amp (%) Site1 Site2 L Vel (m/Daniel) R Vel (m/Daniel) L-R Vel (m/Daniel)  Median Acr Palm Anti Sensory (2nd Digit)  32.8C  Wrist *4.3 *5.5 1.2 *8.9 13.9 36.0 Wrist Palm     Palm *2.2 *4.4 2.2 19.3 19.5 1.0       Radial Anti Sensory (Base 1st Digit)  32.7C  Wrist 2.0 1.9 0.1 33.5 24.5 26.9 Wrist Base 1st Digit     Ulnar Anti Sensory (5th Digit)  33C  Wrist 2.9 3.1 0.2 31.7 20.5 35.3 Wrist 5th Digit 48 45 3   Motor Left/Right Comparison   Stim Site L Lat (ms) R Lat (ms) L-R Lat (ms) L Amp (mV) R Amp (mV) L-R Amp (%) Site1 Site2 L Vel (m/Daniel) R Vel (m/Daniel) L-R Vel (m/Daniel)  Median Motor (Abd Poll Brev)  33C  Wrist *5.2 *5.6 0.4 *3.5 *4.9 28.6 Elbow Wrist *49 98 *49  Elbow 9.1 7.7 1.4 4.1 5.5 25.5       Ulnar Motor (Abd Dig Min)  33.1C  Wrist 2.9 2.7 0.2 8.0 7.9 1.2 B Elbow Wrist 68 62 6  B Elbow 5.7 5.7 0.0 6.5 7.4 12.2 A Elbow B Elbow 71 63 8  A Elbow 7.1 7.3 0.2 6.1 6.9 11.6             Clinical History: No specialty comments available.  She reports that she has never smoked. She has never used smokeless tobacco. No results for input(Daniel): HGBA1C, LABURIC in the last 8760 hours.  Objective:  VS:  HT:    WT:   BMI:     BP:   HR: bpm  TEMP: ( )  RESP:  Physical Exam  Ortho Exam Imaging: No results found.  Past Medical/Family/Surgical/Social History: Medications & Allergies reviewed per EMR Patient Active Problem List   Diagnosis Date Noted  . Pain in left  hip 04/23/2016  . Pain in both upper extremities 04/23/2016  . Mild intermittent asthma 06/01/2015  . LPRD (laryngopharyngeal reflux disease) 06/01/2015  . Allergic rhinoconjunctivitis 06/01/2015  . Morbid obesity (HCC) 04/02/2015  . GERD (gastroesophageal reflux disease) 03/16/2015  . Upper airway cough syndrome 03/16/2015   Past Medical History:  Diagnosis Date  . Allergic rhinitis    per Dr. Lucie Leather  . Asthma    per Dr. Lucie Leather  . DJD (degenerative joint disease)   . GERD (gastroesophageal reflux disease)   . Hyperlipidemia   . Hypertension    Family History  Problem Relation Age of Onset  . Heart disease Father   . Allergies Sister   . Eczema Sister   . Asthma Sister   . Hypertension      x 4 siblings   Past Surgical History:  Procedure Laterality Date  . APPENDECTOMY    . BRAVO PH STUDY N/A 03/16/2015   Procedure: BRAVO PH STUDY;  Surgeon: Charlott Rakes, MD;  Location: [facility];  Service: Endoscopy;  Laterality: N/A;  . CESAREAN SECTION    . ESOPHAGOGASTRODUODENOSCOPY N/A 03/16/2015   Procedure: ESOPHAGOGASTRODUODENOSCOPY (EGD);  Surgeon: Charlott Rakes, MD;  Location: [facility];  Service: Endoscopy;  Laterality: N/A;  . MYOMECTOMY    . TEMPOROMANDIBULAR JOINT SURGERY Right   . TONSILLECTOMY     Social  History   Occupational History  . judge    Social History Main Topics  . Smoking status: Never Smoker  . Smokeless tobacco: Never Used  . Alcohol use 0.0 oz/week     Comment: glass a wine a day  . Drug use: No  . Sexual activity: Yes

## 2016-05-28 ENCOUNTER — Ambulatory Visit (INDEPENDENT_AMBULATORY_CARE_PROVIDER_SITE_OTHER): Payer: No Typology Code available for payment source | Admitting: Orthopaedic Surgery

## 2016-05-28 ENCOUNTER — Encounter (INDEPENDENT_AMBULATORY_CARE_PROVIDER_SITE_OTHER): Payer: Self-pay | Admitting: Orthopaedic Surgery

## 2016-05-28 VITALS — BP 127/79 | HR 83 | Ht 64.0 in | Wt 190.0 lb

## 2016-05-28 DIAGNOSIS — G5603 Carpal tunnel syndrome, bilateral upper limbs: Secondary | ICD-10-CM | POA: Diagnosis not present

## 2016-05-28 NOTE — Progress Notes (Signed)
Office Visit Note   Patient: Brenda Brenda           Date of Birth: 1955/09/09           MRN: [id number] Visit Date: 05/28/2016              Requested by: Brenda Montana, MD [phone] Brenda Brenda [address] [place], [place] [address] PCP: Brenda Bradford, MD   Assessment & Plan: Visit Diagnoses: bilateral carpal tunnel   Plan: surgical consideration-long discussion re carpal tunnel release, rehab , limitations  Follow-Up Instructions: No Follow-up on file.   Orders:  No orders of the defined types were placed in this encounter.  No orders of the defined types were placed in this encounter.     Procedures: No procedures performed   Clinical Data: No additional findings.   Subjective: No chief complaint on file. Brenda Brenda has had progressive symptoms of carpal tunnel syndrome over many years. She has reached the point where she is frustrated with the pain, numbness and tingling in the radial 3 digits of both hands. She is able to sleep at night. However, other symptoms have reached a point where she is ready to consider surgery  Pt had ENG studies from Dr. Alvester Daniel. Here today for the results.   EMG and nerve conduction studies demonstrate moderate to severe bilateral carpal tunnel syndrome without evidence of radiculopathy or other neuropathies. Review of Systems   Objective: Vital Signs: There were no vitals taken for this visit.  Physical Exam  Ortho Exam painless range of motion of cervical spine. No pain with range of motion of either shoulder. Referred pain with any motion of cervical spine. Positive Phalen's and positive Tinel's both wrists  Good grip and release. Normal sensibility. Good motor strength. Normal opposition of thumb to little finger bilaterally. No swelling of hand or wrist.   Imaging: No results found.   PMFS History: Patient Active Problem List   Diagnosis Date Noted  . Pain in left hip 04/23/2016  . Pain in both upper extremities  04/23/2016  . Mild intermittent asthma 06/01/2015  . LPRD (laryngopharyngeal reflux disease) 06/01/2015  . Allergic rhinoconjunctivitis 06/01/2015  . Morbid obesity (HCC) 04/02/2015  . GERD (gastroesophageal reflux disease) 03/16/2015  . Upper airway cough syndrome 03/16/2015   Past Medical History:  Diagnosis Date  . Allergic rhinitis    per Dr. Lucie Brenda  . Asthma    per Dr. Lucie Brenda  . DJD (degenerative joint disease)   . GERD (gastroesophageal reflux disease)   . Hyperlipidemia   . Hypertension     Family History  Problem Relation Age of Onset  . Heart disease Father   . Allergies Sister   . Eczema Sister   . Asthma Sister   . Hypertension      x 4 siblings    Past Surgical History:  Procedure Laterality Date  . APPENDECTOMY    . BRAVO PH STUDY N/A 03/16/2015   Procedure: BRAVO PH STUDY;  Surgeon: Brenda Rakes, MD;  Location: [facility];  Service: Endoscopy;  Laterality: N/A;  . CESAREAN SECTION    . ESOPHAGOGASTRODUODENOSCOPY N/A 03/16/2015   Procedure: ESOPHAGOGASTRODUODENOSCOPY (EGD);  Surgeon: Brenda Rakes, MD;  Location: [facility];  Service: Endoscopy;  Laterality: N/A;  . MYOMECTOMY    . TEMPOROMANDIBULAR JOINT SURGERY Right   . TONSILLECTOMY     Social History   Occupational History  . judge    Social History Main Topics  . Smoking status: Never Smoker  .  Smokeless tobacco: Never Used  . Alcohol use 0.0 oz/week     Comment: glass a wine a day  . Drug use: No  . Sexual activity: Yes

## 2016-05-29 ENCOUNTER — Telehealth (INDEPENDENT_AMBULATORY_CARE_PROVIDER_SITE_OTHER): Payer: Self-pay | Admitting: Orthopaedic Surgery

## 2016-05-29 NOTE — Telephone Encounter (Signed)
LVM to schedule surgery on 05/28/16 and 05/29/16. Will try to call pt again at a later time

## 2016-06-13 ENCOUNTER — Other Ambulatory Visit: Payer: Self-pay | Admitting: Allergy and Immunology

## 2016-07-05 ENCOUNTER — Encounter: Payer: Self-pay | Admitting: Orthopaedic Surgery

## 2016-07-05 DIAGNOSIS — G5601 Carpal tunnel syndrome, right upper limb: Secondary | ICD-10-CM

## 2016-07-09 ENCOUNTER — Encounter (INDEPENDENT_AMBULATORY_CARE_PROVIDER_SITE_OTHER): Payer: Self-pay | Admitting: Orthopaedic Surgery

## 2016-07-09 ENCOUNTER — Ambulatory Visit (INDEPENDENT_AMBULATORY_CARE_PROVIDER_SITE_OTHER): Payer: No Typology Code available for payment source | Admitting: Orthopaedic Surgery

## 2016-07-09 VITALS — BP 134/91 | HR 89 | Ht 64.0 in | Wt 190.0 lb

## 2016-07-09 DIAGNOSIS — G5601 Carpal tunnel syndrome, right upper limb: Secondary | ICD-10-CM

## 2016-07-09 NOTE — Progress Notes (Signed)
   Office Visit Note   Patient: Brenda Daniel           Date of Birth: 04/28/1956           MRN: [id number] Visit Date: 07/09/2016              Requested by: [name], MD [phone] [address] [place], [place] [address] PCP: [name],[name], MD   Assessment & Plan: Visit Diagnoses: 4 days status post right carpal tunnel release without problem  Plan: Return in 1 week first stitch removal, where full wrist splint, may shower with waterproof Band-Aid  Follow-Up Instructions: No Follow-up on file.   Orders:  No orders of the defined types were placed in this encounter.  No orders of the defined types were placed in this encounter.     Procedures: No procedures performed   Clinical Data: No additional findings.   Subjective: Chief Complaint  Patient presents with  . Right Hand - Carpal Tunnel, Routine Post Op    4 days status post Right Carpal tunnel release. Pt relates "it really hasn't hurt much. Tylenol for pain    Review of Systems   Objective: Vital Signs: There were no vitals taken for this visit.  Physical Exam  Ortho Exam right hand exam notes the carpal tunnel incision to be healing without problem. It was clean without call and a Band-Aid applied. Essentially full range of motion of her fingers. No swelling. Normal capillary refill and sensibility to hand.  Specialty Comments:  No specialty comments available.  Imaging: No results found.   PMFS History: Patient Active Problem List   Diagnosis Date Noted  . Pain in left hip 04/23/2016  . Pain in both upper extremities 04/23/2016  . Mild intermittent asthma 06/01/2015  . LPRD (laryngopharyngeal reflux disease) 06/01/2015  . Allergic rhinoconjunctivitis 06/01/2015  . Morbid obesity (HCC) 04/02/2015  . GERD (gastroesophageal reflux disease) 03/16/2015  . Upper airway cough syndrome 03/16/2015   Past Medical History:  Diagnosis Date  . Allergic rhinitis    per Dr. Lucie LeatherKozlow  .  Asthma    per Dr. Lucie LeatherKozlow  . DJD (degenerative joint disease)   . GERD (gastroesophageal reflux disease)   . Hyperlipidemia   . Hypertension     Family History  Problem Relation Age of Onset  . Heart disease Father   . Allergies Sister   . Eczema Sister   . Asthma Sister   . Hypertension      x 4 siblings    Past Surgical History:  Procedure Laterality Date  . APPENDECTOMY    . BRAVO PH STUDY N/A 03/16/2015   Procedure: BRAVO PH STUDY;  Surgeon: Charlott RakesVincent Schooler, MD;  Location: [facility];  Service: Endoscopy;  Laterality: N/A;  . CESAREAN SECTION    . ESOPHAGOGASTRODUODENOSCOPY N/A 03/16/2015   Procedure: ESOPHAGOGASTRODUODENOSCOPY (EGD);  Surgeon: Charlott RakesVincent Schooler, MD;  [facility];  Service: Endoscopy;  Laterality: N/A;  . MYOMECTOMY    . TEMPOROMANDIBULAR JOINT SURGERY Right   . TONSILLECTOMY     Social History   Occupational History  . judge    Social History Main Topics  . Smoking status: Never Smoker  . Smokeless tobacco: Never Used  . Alcohol use 0.0 oz/week     Comment: glass a wine a day  . Drug use: No  . Sexual activity: Yes

## 2016-07-12 ENCOUNTER — Telehealth (INDEPENDENT_AMBULATORY_CARE_PROVIDER_SITE_OTHER): Payer: Self-pay | Admitting: Orthopaedic Surgery

## 2016-07-12 NOTE — Telephone Encounter (Signed)
Please advise 

## 2016-07-12 NOTE — Telephone Encounter (Signed)
Patient called and stated that she is running a low grade fever and is wanting to know if that is normal or is that a sign of infection.  She states that the incision does not look infected but wanted to make sure she did not need to come in.

## 2016-07-12 NOTE — Telephone Encounter (Signed)
Not having pain in wrist.  She stated the incision looks fine. Able to make fist with minimal pain.  Symptoms are that of queeziness and maybe more of a clamminess on her forehead.  She will go purchase a thermometer and chack temp and call if elevated.  Told her she may be having something else systemic and may need to see medical doctor

## 2016-07-17 ENCOUNTER — Ambulatory Visit (INDEPENDENT_AMBULATORY_CARE_PROVIDER_SITE_OTHER): Payer: No Typology Code available for payment source | Admitting: Orthopaedic Surgery

## 2016-07-17 ENCOUNTER — Encounter: Payer: Self-pay | Admitting: Allergy and Immunology

## 2016-07-17 ENCOUNTER — Ambulatory Visit (INDEPENDENT_AMBULATORY_CARE_PROVIDER_SITE_OTHER): Payer: No Typology Code available for payment source | Admitting: Allergy and Immunology

## 2016-07-17 VITALS — BP 140/92 | HR 72 | Resp 18

## 2016-07-17 DIAGNOSIS — J4520 Mild intermittent asthma, uncomplicated: Secondary | ICD-10-CM

## 2016-07-17 DIAGNOSIS — K219 Gastro-esophageal reflux disease without esophagitis: Secondary | ICD-10-CM | POA: Diagnosis not present

## 2016-07-17 DIAGNOSIS — J309 Allergic rhinitis, unspecified: Secondary | ICD-10-CM

## 2016-07-17 DIAGNOSIS — H1010 Acute atopic conjunctivitis, unspecified eye: Secondary | ICD-10-CM | POA: Diagnosis not present

## 2016-07-17 DIAGNOSIS — G5601 Carpal tunnel syndrome, right upper limb: Secondary | ICD-10-CM

## 2016-07-17 MED ORDER — FLUTICASONE PROPIONATE HFA 110 MCG/ACT IN AERO: INHALATION_SPRAY | RESPIRATORY_TRACT | 5 refills | Status: DC

## 2016-07-17 MED ORDER — RANITIDINE HCL 300 MG PO TABS: ORAL_TABLET | ORAL | 5 refills | Status: DC

## 2016-07-17 MED ORDER — MONTELUKAST SODIUM 10 MG PO TABS: ORAL_TABLET | ORAL | 5 refills | Status: DC

## 2016-07-17 NOTE — Patient Instructions (Addendum)
  1. Continue to Treat reflux with Nexium 40 mg once a day    2. Action plan for respiratory flare:   A. Nexium 40 mg twice a day and add Ranitidine 300 in evening  B. start Flovent 110 (Qvar substitute) 2 inhalations twice a day  C. use ProAir HFA 2 puffs every 4-6 hours if needed  3. Continue Flonase one-2 sprays each nostril one time per day   4. Continue Montelukast 10mg  one time per day  5. If needed:   A. antihistamine   B. Mucinex DM  6. Return to clinic in 12 months or earlier if problem

## 2016-07-17 NOTE — Progress Notes (Signed)
   Office Visit Note   Patient: Brenda Daniel           Date of Birth: 05/11/1956           MRN: [id number] Visit Date: 07/17/2016              Requested by: [name], MD [phone] [address] [place], [place] [address] PCP: [name],[name], MD   Assessment & Plan: Visit Diagnoses: 11 days status post right carpal tunnel release and doing well  Plan: Instructions on care of incision, remove the stitches and apply Steri-Strips, wear a volar wrist splint for another 10-11 days and then gradually increased activities after 3-4 weeks  Follow-Up Instructions: No Follow-up on file.   Orders:  No orders of the defined types were placed in this encounter.  No orders of the defined types were placed in this encounter.     Procedures: No procedures performed   Clinical Data: No additional findings.   Subjective: No chief complaint on file.   [name] her for status post 2 weeks Right Carpal Tunnel Repair. Pt relates no pain or edema.  Sutures removed, steri strips applied, incision is clean, dry and intact.  No related problems.  Review of Systems   Objective: Vital Signs: There were no vitals taken for this visit.  Physical Exam  Ortho Exam right hand incisions healing very nicely without evidence of infection. No induration. No hand swelling. Neurovascular exam intact. Full range of motion of fingers.  Specialty Comments:  No specialty comments available.  Imaging: No results found.   PMFS History: Patient Active Problem List   Diagnosis Date Noted  . Pain in left hip 04/23/2016  . Pain in both upper extremities 04/23/2016  . Mild intermittent asthma 06/01/2015  . LPRD (laryngopharyngeal reflux disease) 06/01/2015  . Allergic rhinoconjunctivitis 06/01/2015  . Morbid obesity (HCC) 04/02/2015  . GERD (gastroesophageal reflux disease) 03/16/2015  . Upper airway cough syndrome 03/16/2015   Past Medical History:  Diagnosis Date  . Allergic  rhinitis    per Dr. Lucie LeatherKozlow  . Asthma    per Dr. Lucie LeatherKozlow  . DJD (degenerative joint disease)   . GERD (gastroesophageal reflux disease)   . Hyperlipidemia   . Hypertension     Family History  Problem Relation Age of Onset  . Heart disease Father   . Allergies Sister   . Eczema Sister   . Asthma Sister   . Hypertension      x 4 siblings    Past Surgical History:  Procedure Laterality Date  . APPENDECTOMY    . BRAVO PH STUDY N/A 03/16/2015   Procedure: BRAVO PH STUDY;  Surgeon: Charlott RakesVincent Schooler, MD;  Location: [facility];  Service: Endoscopy;  Laterality: N/A;  . CESAREAN SECTION    . ESOPHAGOGASTRODUODENOSCOPY N/A 03/16/2015   Procedure: ESOPHAGOGASTRODUODENOSCOPY (EGD);  Surgeon: Charlott RakesVincent Schooler, MD;  [facility];  Service: Endoscopy;  Laterality: N/A;  . MYOMECTOMY    . TEMPOROMANDIBULAR JOINT SURGERY Right   . TONSILLECTOMY     Social History   Occupational History  . judge    Social History Main Topics  . Smoking status: Never Smoker  . Smokeless tobacco: Never Used  . Alcohol use 0.0 oz/week     Comment: glass a wine a day  . Drug use: No  . Sexual activity: Yes

## 2016-07-17 NOTE — Progress Notes (Signed)
Follow-up Note  Referring Provider: Laurann Montana, MD Primary Provider: Cala Bradford, MD Date of Office Visit: 07/17/2016  Subjective:   Brenda Daniel (DOB: 1955/06/07) is a 61 y.o. female [facility] in re-evaluation of the following:  HPI: Gregg returns to this clinic in reevaluation of her intermittent asthma and LPR and allergic rhinoconjunctivitis. I have not seen her in this clinic in over one year.  She has really done well with her respiratory tract issues having to activate her action plan approximately 3 times throughout the year with good response within several days. Rarely does she require a short acting bronchodilator. She has not required a systemic steroid or an antibiotic to treat her respiratory tract issue.  However, on January 10th 2018 she developed a cough and her action plan did not resolve this cough. She had no associated upper respiratory tract symptoms and no issues with reflux at that point. There was no fever associated with this event. She did require the administration of prednisone which resulted in 90% improvement but she still has a little bit of intermittent cough and maybe some chest heaviness. Most of this appears to occur when she talks. She has a spell of cough about 6 times per day that is relatively mild and is not one of her previous unrelenting coughing spells that she experienced before activating her plan. Presently she has no fever or ugly sputum production or ugly nasal discharge or chest pain or other respiratory tract for systemic or constitutional symptoms.  She did obtain the flu vaccine this year.  Allergies as of 07/17/2016      Reactions   Advil [ibuprofen]    dizzy   Niacin And Related    dizzy   Red Yeast Rice [cholestin]    dizzy      Medication List      cyclobenzaprine 5 MG tablet Commonly known as:  FLEXERIL 1 PO TID prn   diclofenac sodium 1 % Gel Commonly  known as:  VOLTAREN Apply 2 g topically 4 (four) times daily.   esomeprazole 40 MG capsule Commonly known as:  NEXIUM ONE CAPSULE DAILY FOR REFLUX   fexofenadine 180 MG tablet Commonly known as:  ALLEGRA Reported on 05/31/2015   fluticasone 50 MCG/ACT nasal spray Commonly known as:  FLONASE Place 1 spray into both nostrils daily.   hydrochlorothiazide 25 MG tablet Commonly known as:  HYDRODIURIL Take 12.5 mg by mouth daily.   losartan-hydrochlorothiazide 100-25 MG tablet Commonly known as:  HYZAAR Take 1 tablet by mouth daily.   montelukast 10 MG tablet Commonly known as:  SINGULAIR Take 1 tablet (10 mg total) by mouth at bedtime.   QVAR 80 MCG/ACT inhaler Generic drug:  beclomethasone INHALE TWO PUFFS ONCE DAILY WITH SPACER TO PREVENT COUGH OR WHEEZE...RINSE, GARGLE, SPIT AFTER USE.   ranitidine 300 MG tablet Commonly known as:  ZANTAC TAKE 1 TABLET (300 MG TOTAL) BY MOUTH AT BEDTIME.   traZODone 50 MG tablet Commonly known as:  DESYREL Take 25 mg by mouth at bedtime.   valACYclovir 500 MG tablet Commonly known as:  VALTREX 1 TABLET ONCE A DAY ORALLY 90 DAYS       Past Medical History:  Diagnosis Date  . Allergic rhinitis    per Dr. Lucie Leather  . Asthma    per Dr. Lucie Leather  . DJD (degenerative joint disease)   . GERD (gastroesophageal reflux disease)   . Hyperlipidemia   .  Hypertension     Past Surgical History:  Procedure Laterality Date  . APPENDECTOMY    . BRAVO PH STUDY N/A 03/16/2015   Procedure: BRAVO PH STUDY;  Surgeon: Charlott Rakes, MD;  Location: [facility];  Service: Endoscopy;  Laterality: N/A;  . CESAREAN SECTION    . ESOPHAGOGASTRODUODENOSCOPY N/A 03/16/2015   Procedure: ESOPHAGOGASTRODUODENOSCOPY (EGD);  Surgeon: Charlott Rakes, MD;  [facility];  Service: Endoscopy;  Laterality: N/A;  . MYOMECTOMY    . TEMPOROMANDIBULAR JOINT SURGERY Right   . TONSILLECTOMY      Review of systems negative except as noted in HPI / PMHx  or noted below:  Review of Systems  Constitutional: Negative.   HENT: Negative.   Eyes: Negative.   Respiratory: Negative.   Cardiovascular: Negative.   Gastrointestinal: Negative.   Genitourinary: Negative.   Musculoskeletal: Negative.   Skin: Negative.   Neurological: Negative.   Endo/Heme/Allergies: Negative.   Psychiatric/Behavioral: Negative.      Objective:   Vitals:   07/17/16 0938  BP: (!) 140/92  Pulse: 72  Resp: 18          Physical Exam  Constitutional: She is well-developed, well-nourished, and in no distress.  HENT:  Head: Normocephalic.  Right Ear: Tympanic membrane, external ear and ear canal normal.  Left Ear: Tympanic membrane, external ear and ear canal normal.  Nose: Nose normal. No mucosal edema or rhinorrhea.  Mouth/Throat: Uvula is midline, oropharynx is clear and moist and mucous membranes are normal. No oropharyngeal exudate.  Eyes: Conjunctivae are normal.  Neck: Trachea normal. No tracheal tenderness present. No tracheal deviation present. No thyromegaly present.  Cardiovascular: Normal rate, regular rhythm, S1 normal, S2 normal and normal heart sounds.   No murmur heard. Pulmonary/Chest: Breath sounds normal. No stridor. No respiratory distress. She has no wheezes. She has no rales.  Musculoskeletal: She exhibits no edema.  Lymphadenopathy:       Head (right side): No tonsillar adenopathy present.       Head (left side): No tonsillar adenopathy present.    She has no cervical adenopathy.  Neurological: She is alert. Gait normal.  Skin: No rash noted. She is not diaphoretic. No erythema. Nails show no clubbing.  Psychiatric: Mood and affect normal.    Diagnostics:    Spirometry was performed and demonstrated an FEV1 of 2.25 at 92 % of predicted.  Assessment and Plan:   1. Mild intermittent asthma, uncomplicated   2. LPRD (laryngopharyngeal reflux disease)   3. Allergic rhinoconjunctivitis     1. Continue to Treat reflux with  Nexium 40 mg once a day    2. Action plan for respiratory flare:   A. Nexium 40 mg twice a day and add Ranitidine 300 in evening  B. start Flovent 110 (Qvar substitute) 2 inhalations twice a day  C. use ProAir HFA 2 puffs every 4-6 hours if needed  3. Continue Flonase one-2 sprays each nostril one time per day   4. Continue Montelukast 10mg  one time per day  5. If needed:   A. antihistamine   B. Mucinex DM  6. Return to clinic in 12 months or earlier if problem  Dejae appears to have developed some type of respiratory tract inflammatory event that was most likely precipitated by a viral infection. She is much improved on her current medical therapy which also includes a short course of systemic steroids but still has some lingering cough and she may have rolled over into a secondary form of respiratory  tract irritation from all for coughing. I will assume she will do well within the next 1-2 weeks with therapy and if there is a problem as she moves forward she will contact me for further evaluation and treatment. Otherwise, I will just see her back in this clinic in 1 year.  Laurette SchimkeEric Allenmichael Mcpartlin, MD Allergy / Immunology [facility]

## 2016-07-30 ENCOUNTER — Ambulatory Visit (INDEPENDENT_AMBULATORY_CARE_PROVIDER_SITE_OTHER): Payer: No Typology Code available for payment source | Admitting: Orthopaedic Surgery

## 2016-07-30 DIAGNOSIS — G5601 Carpal tunnel syndrome, right upper limb: Secondary | ICD-10-CM

## 2016-07-30 NOTE — Progress Notes (Signed)
   Office Visit Note   Patient: Brenda BernheimCatherine C Bondar           Date of Birth: 23-Oct-1955           MRN: [id number] Visit Date: 07/30/2016              Requested by: [name], MD [phone] [address] [place], [place] [address] PCP: [name],[name], MD   Assessment & Plan: Visit Diagnoses: 1 month status post right carpal tunnel release and doing well  Plan: Range of motion exercises, return to activity as tolerated, follow-up as needed.  Follow-Up Instructions: No Follow-up on file.   Orders:  No orders of the defined types were placed in this encounter.  No orders of the defined types were placed in this encounter.     Procedures: No procedures performed   Clinical Data: No additional findings.   Subjective: No chief complaint on file.   3 1/2 weeks status post right carpal tunnel release and doing well. Discontinued brace, uncomfortable  No longer symptomatic in right upper extremity. Some weakness as expected. No longer having numbness and tingling  Review of Systems   Objective: Vital Signs: There were no vitals taken for this visit.  Physical Exam  Ortho Exam right carpal tunnel incision is healed nicely. There is some superficial healing of the skin. Some mild induration around the incision as expected. No swelling distally. Neurovascular exam intact. Slight loss of dorsiflexion right wrist compared to left which certainly should resolve over time.  Specialty Comments:  No specialty comments available.  Imaging: No results found.   PMFS History: Patient Active Problem List   Diagnosis Date Noted  . Pain in left hip 04/23/2016  . Pain in both upper extremities 04/23/2016  . Mild intermittent asthma 06/01/2015  . LPRD (laryngopharyngeal reflux disease) 06/01/2015  . Allergic rhinoconjunctivitis 06/01/2015  . Morbid obesity (HCC) 04/02/2015  . GERD (gastroesophageal reflux disease) 03/16/2015  . Upper airway cough syndrome 03/16/2015    Past Medical History:  Diagnosis Date  . Allergic rhinitis    per Dr. Lucie LeatherKozlow  . Asthma    per Dr. Lucie LeatherKozlow  . DJD (degenerative joint disease)   . GERD (gastroesophageal reflux disease)   . Hyperlipidemia   . Hypertension     Family History  Problem Relation Age of Onset  . Heart disease Father   . Allergies Sister   . Eczema Sister   . Asthma Sister   . Hypertension      x 4 siblings    Past Surgical History:  Procedure Laterality Date  . APPENDECTOMY    . BRAVO PH STUDY N/A 03/16/2015   Procedure: BRAVO PH STUDY;  Surgeon: Charlott RakesVincent Schooler, MD;  Location: [facility];  Service: Endoscopy;  Laterality: N/A;  . CESAREAN SECTION    . ESOPHAGOGASTRODUODENOSCOPY N/A 03/16/2015   Procedure: ESOPHAGOGASTRODUODENOSCOPY (EGD);  Surgeon: Charlott RakesVincent Schooler, MD;  [facility];  Service: Endoscopy;  Laterality: N/A;  . MYOMECTOMY    . TEMPOROMANDIBULAR JOINT SURGERY Right   . TONSILLECTOMY     Social History   Occupational History  . judge    Social History Main Topics  . Smoking status: Never Smoker  . Smokeless tobacco: Never Used  . Alcohol use 0.0 oz/week     Comment: glass a wine a day  . Drug use: No  . Sexual activity: Yes

## 2016-08-09 ENCOUNTER — Other Ambulatory Visit: Payer: Self-pay | Admitting: Family Medicine

## 2016-08-09 ENCOUNTER — Other Ambulatory Visit (HOSPITAL_COMMUNITY)
Admission: RE | Admit: 2016-08-09 | Discharge: 2016-08-09 | Disposition: A | Discharge status: Home / Self Care | Payer: No Typology Code available for payment source | Source: Ambulatory Visit | Attending: Family Medicine | Admitting: Family Medicine

## 2016-08-09 DIAGNOSIS — Z1151 Encounter for screening for human papillomavirus (HPV): Secondary | ICD-10-CM | POA: Diagnosis present

## 2016-08-09 DIAGNOSIS — Z124 Encounter for screening for malignant neoplasm of cervix: Secondary | ICD-10-CM | POA: Insufficient documentation

## 2016-08-10 LAB — CYTOLOGY - PAP
DIAGNOSIS: NEGATIVE
HPV (WINDOPATH): NOT DETECTED

## 2016-08-31 ENCOUNTER — Encounter: Payer: Self-pay | Admitting: Allergy

## 2016-08-31 ENCOUNTER — Ambulatory Visit (INDEPENDENT_AMBULATORY_CARE_PROVIDER_SITE_OTHER): Payer: No Typology Code available for payment source | Admitting: Allergy

## 2016-08-31 VITALS — BP 110/80 | HR 112 | Temp 98.7°F | Resp 17

## 2016-08-31 DIAGNOSIS — J309 Allergic rhinitis, unspecified: Secondary | ICD-10-CM

## 2016-08-31 DIAGNOSIS — J4521 Mild intermittent asthma with (acute) exacerbation: Secondary | ICD-10-CM | POA: Diagnosis not present

## 2016-08-31 DIAGNOSIS — H1010 Acute atopic conjunctivitis, unspecified eye: Secondary | ICD-10-CM

## 2016-08-31 DIAGNOSIS — K219 Gastro-esophageal reflux disease without esophagitis: Secondary | ICD-10-CM | POA: Diagnosis not present

## 2016-08-31 MED ORDER — IPRATROPIUM-ALBUTEROL 0.5-2.5 (3) MG/3ML IN SOLN: 3.0000 mL | RESPIRATORY_TRACT | 1 refills | Status: DC | PRN

## 2016-08-31 MED ORDER — ALBUTEROL SULFATE HFA 108 (90 BASE) MCG/ACT IN AERS: INHALATION_SPRAY | RESPIRATORY_TRACT | 1 refills | Status: DC

## 2016-08-31 MED ORDER — BENZONATATE 100 MG PO CAPS: 100.0000 mg | ORAL_CAPSULE | Freq: Three times a day (TID) | ORAL | 0 refills | Status: DC | PRN

## 2016-08-31 NOTE — Progress Notes (Signed)
Follow-up Note  RE: Brenda Daniel MRN: [id number] DOB: [birth year] Date of Office Visit: 08/31/2016   History of present illness: Brenda Daniel is a 61 y.o. female presenting today for SICK VISIT. She was last seen in the office by Dr. Lucie Leather on February 27th 2018 for asthma, reflux and allergic rhinoconjunctivitis.  She reports she was sick around the time of her last visit and she never completely got well from that. However symptoms seem to worsen this week on Wednesday night after she got home from work. She states she developed a more "spasmy" cough.  She reports when she coughs the middle of her chest hurts.    she was on Qvar which was changed over to Flovent however she also states she has Pulmicort inhaler which she has been using 2 puffs twice a day.  She has Flovent but has not started this she is waiting for her Pulmicort to run out.  She is not using albuterol She reports it never seems to help. Denies voice changes and does not feel he needs a lot of throat.  she denies having any fever or any known sick contacts.  She is taking her routine medicines of Nexium, allegra, singulair and flonase.  She is not using Mucinex in the past 2 days with worsening of her symptoms are any other over-the-counter treatments.        Review of systems: Review of Systems  Constitutional: Negative for chills, fever and malaise/fatigue.  HENT: Positive for congestion. Negative for ear discharge, ear pain, nosebleeds, sinus pain, sore throat and tinnitus.   Eyes: Negative for discharge and redness.  Respiratory: Positive for cough, shortness of breath and wheezing. Negative for sputum production.   Cardiovascular: Positive for chest pain.  Gastrointestinal: Negative for abdominal pain, heartburn, nausea and vomiting.  Musculoskeletal: Negative for joint pain and myalgias.  Skin: Negative for itching and rash.  Neurological: Negative for headaches.    All other systems negative unless  noted above in HPI  Past medical/social/surgical/family history have been reviewed and are unchanged unless specifically indicated below.  No changes  Medication List: Allergies as of 08/31/2016      Reactions   Advil [ibuprofen]    dizzy   Niacin And Related    dizzy   Red Yeast Rice [cholestin]    dizzy      Medication List       Accurate as of 08/31/16  6:06 PM. Always use your most recent med list.          cyclobenzaprine 5 MG tablet Commonly known as:  FLEXERIL 1 PO TID prn   diclofenac sodium 1 % Gel Commonly known as:  VOLTAREN Apply 2 g topically 4 (four) times daily.   esomeprazole 40 MG capsule Commonly known as:  NEXIUM ONE CAPSULE DAILY FOR REFLUX   fexofenadine 180 MG tablet Commonly known as:  ALLEGRA Reported on 05/31/2015   fluticasone 110 MCG/ACT inhaler Commonly known as:  FLOVENT HFA Inhale two puffs twice daily during flare   fluticasone 50 MCG/ACT nasal spray Commonly known as:  FLONASE Place 1 spray into both nostrils daily.   hydrochlorothiazide 25 MG tablet Commonly known as:  HYDRODIURIL Take 12.5 mg by mouth daily.   losartan-hydrochlorothiazide 100-25 MG tablet Commonly known as:  HYZAAR Take 1 tablet by mouth daily.   montelukast 10 MG tablet Commonly known as:  SINGULAIR Take one tablet once daily   QVAR 80 MCG/ACT inhaler Generic drug:  beclomethasone INHALE  TWO PUFFS ONCE DAILY WITH SPACER TO PREVENT COUGH OR WHEEZE...RINSE, GARGLE, SPIT AFTER USE.   ranitidine 300 MG tablet Commonly known as:  ZANTAC Take one tablet each evening during flare as directed   traZODone 50 MG tablet Commonly known as:  DESYREL Take 25 mg by mouth at bedtime.   valACYclovir 500 MG tablet Commonly known as:  VALTREX 1 TABLET ONCE A DAY ORALLY 90 DAYS       Known medication allergies: Allergies  Allergen Reactions  . Advil [Ibuprofen]     dizzy  . Niacin And Related     dizzy  . Red Yeast Rice [Cholestin]     dizzy      Physical examination: Blood pressure 110/80, pulse (!) 112, temperature 98.7 F (37.1 C), temperature source Oral, resp. rate 17, SpO2 96 %.  General: Alert, interactive, in no acute distress. HEENT: TMs pearly gray, turbinates mildly edematous with clear discharge, post-pharynx non erythematous. Neck: Supple without lymphadenopathy. Lungs: Mildly decreased breath sounds bilaterally without wheezing, rhonchi or rales. {no increased work of breathing.  Following DuoNeb she did have increased aeration throughout. She however does not feel any significant difference..   CV: Normal S1, S2 without murmurs. Abdomen: Nondistended, nontender. Skin: Warm and dry, without lesions or rashes. Extremities:  No clubbing, cyanosis or edema. Neuro:   Grossly intact.  Diagnositics/Labs:  Spirometry: FEV1: 1.62L  66%, FVC: 2.55L  84%, following DuoNeb she had a 13% increase in her FEV1 to 1.82 L which is significant.   Assessment and plan: Mild intermittent asthma with acute exacerbation LPRD Allergic rhinoconjunctivitis  1. Continue to Treat reflux with Nexium 40 mg once a day    2. Action plan for respiratory flare:   A. Nexium 40 mg twice a day and add Ranitidine 300 in evening  B. Use Flovent 110 (Qvar substitute) 2 inhalations twice a day  C. use ProAir HFA 2 puffs or DuoNeb 1 vial every 4 hours (while awake) for next 2 days then resume as needed every 4-6 hours.   Given she had a significant bronchodilator response with use of DuoNeb will provide with home use of DuoNeb well as a nebulizer for as needed use           D.  Take prednisone daily pack -  twice a day x 3 days then  x 1 day then  x 1 day then stop.       3. Continue Flonase one-2 sprays each nostril one time per day   4. Continue Montelukast  one time per day  5. If needed:   A. antihistamine   B. Mucinex DM  drink with plenty of water            C. Tessalon perles  3 times a day as needed for  cough  6. Return to clinic in 12 months or earlier if problem  I appreciate the opportunity to take part in Essa's care. Please do not hesitate to contact me with questions.  Sincerely,   Margo Aye, MD Allergy/Immunology [facility]

## 2016-08-31 NOTE — Patient Instructions (Addendum)
   1. Continue to Treat reflux with Nexium 40 mg once a day    2. Action plan for respiratory flare:   A. Nexium 40 mg twice a day and add Ranitidine 300 in evening  B. Use Flovent 110 (Qvar substitute) 2 inhalations twice a day  C. use ProAir HFA 2 puffs or DuoNeb 1 vial every 4 hours (while awake) for next 2 days then resume as needed every 4-6 hours           D.  Take prednisone daily pack -  twice a day x 3 days then  x 1 day then  x 1 day then stop.     Will provide with nebulizer.    3. Continue Flonase one-2 sprays each nostril one time per day   4. Continue Montelukast  one time per day  5. If needed:   A. antihistamine   B. Mucinex DM  drink with plenty of water           C. Tessalon perles  3 times a day as needed for cough  6. Return to clinic in 12 months or earlier if problem

## 2016-09-06 ENCOUNTER — Telehealth: Payer: Self-pay | Admitting: *Deleted

## 2016-09-06 MED ORDER — PREDNISONE 10 MG PO TABS: 20.0000 mg | ORAL_TABLET | Freq: Every day | ORAL | 0 refills | Status: DC

## 2016-09-06 MED ORDER — AMOXICILLIN 875 MG PO TABS: 875.0000 mg | ORAL_TABLET | Freq: Two times a day (BID) | ORAL | 0 refills | Status: DC

## 2016-09-06 NOTE — Telephone Encounter (Signed)
Spoke to patient advised as written per Dr Delorse Lek sent in rx for patient. She verbalized understanding

## 2016-09-06 NOTE — Telephone Encounter (Signed)
Patient called states she is not doing better from last visit on 08/31/16 states she got better Saturday and got worse Sunday. States she has cough denies chest tightness and trouble breathing. States she completed prednisone and is taking Mucinex which thinks it has tried to lossen it up. Dr Delorse Lek please advise

## 2016-09-06 NOTE — Telephone Encounter (Signed)
I would at this time treat for respiratory tract infection with amoxicillin 875 mg twice a day 10 days. Also extend her steroid course out to 20 mg 5 days Continue Mucinex 1200 mg twice a day with plenty of water to continue to loosen mucus She is to continue her regular regimen as recommended at last visit

## 2017-01-14 ENCOUNTER — Ambulatory Visit (INDEPENDENT_AMBULATORY_CARE_PROVIDER_SITE_OTHER): Payer: No Typology Code available for payment source | Admitting: Orthopaedic Surgery

## 2017-01-14 ENCOUNTER — Encounter (INDEPENDENT_AMBULATORY_CARE_PROVIDER_SITE_OTHER): Payer: Self-pay | Admitting: Orthopaedic Surgery

## 2017-01-14 VITALS — BP 152/97 | HR 78 | Resp 14 | Ht 64.0 in | Wt 190.0 lb

## 2017-01-14 DIAGNOSIS — G5602 Carpal tunnel syndrome, left upper limb: Secondary | ICD-10-CM

## 2017-01-14 NOTE — Progress Notes (Signed)
Office Visit Note   Patient: Brenda Daniel           Date of Birth: [birth year]           MRN: [id number] Visit Date: 01/14/2017              Requested by: Laurann Montana, MD [phone] [name] [place], [place] [address] PCP: Laurann Montana, MD   Assessment & Plan: Visit Diagnoses:  1. Carpal tunnel syndrome, left upper limb   About 6-7 months status post right carpal tunnel release with an excellent result. Prior nerve conduction studies and EMGs demonstrated moderate to severe bilateral carpal tunnel syndrome. Presently symptomatic on the left and wishes to discuss surgery  Plan: Will plan left carpal tunnel release  Follow-Up Instructions: Return will schedule left carpal tunnel surgery.   Orders:  No orders of the defined types were placed in this encounter.  No orders of the defined types were placed in this encounter.     Procedures: No procedures performed   Clinical Data: No additional findings.   Subjective: Chief Complaint  Patient presents with  . Right Hand - Pain    Ms. [name] is a 61 y o that presents with Right hand pain, palm has a "knot" at the Long finger x 1 month.  . Left Wrist - Pain, Numbness    Pt wants to discuss possible Left carpal tunnel release surgery. NUmbess, constant tingling, weakness   [name] is doing quite well in terms of her right carpal tunnel release. She no longer is having any symptoms of tingling or numbness. She's become more symptomatic on the left and feels that she is compromised in her activities.  HPI  Review of Systems  Constitutional: Negative for chills, fatigue and fever.  HENT: Positive for ear pain.   Eyes: Negative for itching.  Respiratory: Negative for chest tightness and shortness of breath.   Cardiovascular: Negative for chest pain, palpitations and leg swelling.  Gastrointestinal: Negative for blood in stool, constipation and diarrhea.  Musculoskeletal: Negative for back pain, joint  swelling, neck pain and neck stiffness.  Neurological: Positive for dizziness and numbness. Negative for weakness and headaches.  Hematological: Does not bruise/bleed easily.  Psychiatric/Behavioral: Negative for sleep disturbance. The patient is not nervous/anxious.      Objective: Vital Signs: BP (!) 152/97   Pulse 78   Resp 14   Ht 5\' 4"  (1.626 m)   Wt 190 lb (86.2 kg)   BMI 32.61 kg/m   Physical Exam  Ortho Exam right wrist incision is healed very nicely without any problems. The incision is not painful. It wasn't firm. Full range of motion of fingers. Has developed some early Dupuytren's along the ring flexor tendon but without any contracture and no pain. Negative Tinel's. Right wrist with positive Phalen's and Tinel's. Full range of motion of fingers. No Dupuytren's. Painless range of motion cervical spine. Neurovascular exam intact  Specialty Comments:  No specialty comments available.  Imaging: No results found.   PMFS History: Patient Active Problem List   Diagnosis Date Noted  . Pain in left hip 04/23/2016  . Pain in both upper extremities 04/23/2016  . Mild intermittent asthma 06/01/2015  . LPRD (laryngopharyngeal reflux disease) 06/01/2015  . Allergic rhinoconjunctivitis 06/01/2015  . Morbid obesity (HCC) 04/02/2015  . GERD (gastroesophageal reflux disease) 03/16/2015  . Upper airway cough syndrome 03/16/2015   Past Medical History:  Diagnosis Date  . Allergic rhinitis    per Dr.  Kozlow  . Asthma    per Dr. Lucie Leather  . DJD (degenerative joint disease)   . GERD (gastroesophageal reflux disease)   . Hyperlipidemia   . Hypertension     Family History  Problem Relation Age of Onset  . Heart disease Father   . Allergies Sister   . Eczema Sister   . Asthma Sister   . Hypertension Unknown        x 4 siblings    Past Surgical History:  Procedure Laterality Date  . APPENDECTOMY    . BRAVO PH STUDY N/A 03/16/2015   Procedure: BRAVO PH STUDY;  Surgeon:  Charlott Rakes, MD;  Location: [facility];  Service: Endoscopy;  Laterality: N/A;  . CESAREAN SECTION    . ESOPHAGOGASTRODUODENOSCOPY N/A 03/16/2015   Procedure: ESOPHAGOGASTRODUODENOSCOPY (EGD);  Surgeon: Charlott Rakes, MD;  Location: [facility];  Service: Endoscopy;  Laterality: N/A;  . MYOMECTOMY    . TEMPOROMANDIBULAR JOINT SURGERY Right   . TONSILLECTOMY     Social History   Occupational History  . judge    Social History Main Topics  . Smoking status: Never Smoker  . Smokeless tobacco: Never Used  . Alcohol use 0.0 oz/week     Comment: glass a wine a day  . Drug use: No  . Sexual activity: Yes

## 2017-01-15 ENCOUNTER — Telehealth (INDEPENDENT_AMBULATORY_CARE_PROVIDER_SITE_OTHER): Payer: Self-pay | Admitting: Orthopaedic Surgery

## 2017-01-15 NOTE — Telephone Encounter (Signed)
LVM for pt to please call to schedule surgery. Will call pt again at a later time.

## 2017-01-22 ENCOUNTER — Encounter (INDEPENDENT_AMBULATORY_CARE_PROVIDER_SITE_OTHER): Payer: Self-pay | Admitting: Orthopaedic Surgery

## 2017-02-14 DIAGNOSIS — G5602 Carpal tunnel syndrome, left upper limb: Secondary | ICD-10-CM

## 2017-02-18 ENCOUNTER — Ambulatory Visit (INDEPENDENT_AMBULATORY_CARE_PROVIDER_SITE_OTHER): Payer: No Typology Code available for payment source | Admitting: Orthopaedic Surgery

## 2017-02-18 DIAGNOSIS — G5602 Carpal tunnel syndrome, left upper limb: Secondary | ICD-10-CM

## 2017-02-18 NOTE — Progress Notes (Signed)
Office Visit Note   Patient: Brenda Daniel           Date of Birth: September 10, 1955           MRN: [id number] Visit Date: 02/18/2017              Requested by: Laurann Montana, MD [phone] [name] [place], [place] [address] PCP: Laurann Montana, MD   Assessment & Plan: Visit Diagnoses:  1. Carpal tunnel syndrome, left upper limb     Plan: 4 Days status post left carpal tunnel release and doing well. Dressing changed. waterproof Band-Aid applied.nstructions on activity. Office 1 week for stitch removal  Follow-Up Instructions: Return in about 1 week (around 02/25/2017).   Orders:  No orders of the defined types were placed in this encounter.  No orders of the defined types were placed in this encounter.     Procedures: No procedures performed   Clinical Data: No additional findings.   Subjective: No chief complaint on file. no fever or chills over the weekend. Very minimal discomfort since Friday. Not experiencing any pain or tingling HPI  Review of Systems   Objective: Vital Signs: There were no vitals taken for this visit.  Physical Exam  Ortho Examleft carpal tunnel incision healing without problems. Neurovascular exam intact. Full range of motion of digits.  Specialty Comments:  No specialty comments available.  Imaging: No results found.   PMFS History: Patient Active Problem List   Diagnosis Date Noted  . Carpal tunnel syndrome, left upper limb 02/18/2017  . Pain in left hip 04/23/2016  . Pain in both upper extremities 04/23/2016  . Mild intermittent asthma 06/01/2015  . LPRD (laryngopharyngeal reflux disease) 06/01/2015  . Allergic rhinoconjunctivitis 06/01/2015  . Morbid obesity (HCC) 04/02/2015  . GERD (gastroesophageal reflux disease) 03/16/2015  . Upper airway cough syndrome 03/16/2015   Past Medical History:  Diagnosis Date  . Allergic rhinitis    per Dr. Lucie Leather  . Asthma    per Dr. Lucie Leather  . DJD (degenerative joint  disease)   . GERD (gastroesophageal reflux disease)   . Hyperlipidemia   . Hypertension     Family History  Problem Relation Age of Onset  . Heart disease Father   . Allergies Sister   . Eczema Sister   . Asthma Sister   . Hypertension Unknown        x 4 siblings    Past Surgical History:  Procedure Laterality Date  . APPENDECTOMY    . BRAVO PH STUDY N/A 03/16/2015   Procedure: BRAVO PH STUDY;  Surgeon: Charlott Rakes, MD;  Location: [facility];  Service: Endoscopy;  Laterality: N/A;  . CESAREAN SECTION    . ESOPHAGOGASTRODUODENOSCOPY N/A 03/16/2015   Procedure: ESOPHAGOGASTRODUODENOSCOPY (EGD);  Surgeon: Charlott Rakes, MD;  Location: [facility];  Service: Endoscopy;  Laterality: N/A;  . MYOMECTOMY    . TEMPOROMANDIBULAR JOINT SURGERY Right   . TONSILLECTOMY     Social History   Occupational History  . judge    Social History Main Topics  . Smoking status: Never Smoker  . Smokeless tobacco: Never Used  . Alcohol use 0.0 oz/week     Comment: glass a wine a day  . Drug use: No  . Sexual activity: Yes     Valeria Batman, MD   Note - This record has been created using [employer].  Chart creation errors have been sought, but may not always  have been located. Such creation errors  do not reflect on  the standard of medical care.

## 2017-02-25 ENCOUNTER — Other Ambulatory Visit (INDEPENDENT_AMBULATORY_CARE_PROVIDER_SITE_OTHER): Payer: Self-pay

## 2017-02-25 ENCOUNTER — Encounter (INDEPENDENT_AMBULATORY_CARE_PROVIDER_SITE_OTHER): Payer: Self-pay | Admitting: Orthopaedic Surgery

## 2017-02-25 ENCOUNTER — Ambulatory Visit (INDEPENDENT_AMBULATORY_CARE_PROVIDER_SITE_OTHER): Payer: No Typology Code available for payment source | Admitting: Orthopaedic Surgery

## 2017-02-25 VITALS — BP 132/80 | HR 86 | Resp 14 | Ht 63.0 in | Wt 190.0 lb

## 2017-02-25 DIAGNOSIS — M25551 Pain in right hip: Secondary | ICD-10-CM

## 2017-02-25 DIAGNOSIS — G5602 Carpal tunnel syndrome, left upper limb: Secondary | ICD-10-CM

## 2017-02-25 NOTE — Progress Notes (Signed)
Office Visit Note   Patient: Brenda Brenda           Date of Birth: 1956/05/06           MRN: [id number] Visit Date: 02/25/2017              Requested by: Laurann Montana, MD [phone] Brenda Brenda [address] [place], [place] [address] PCP: Laurann Montana, MD   Assessment & Plan: Visit Diagnoses:  1. Carpal tunnel syndrome, left upper limb   11 days status post left carpal tunnel release doing well  Plan: Since stitches removed and Steri-Strips applied plan to see back in apparent basis doing well without much pain neurovascular exam intact  Follow-Up Instructions: Return if symptoms worsen or fail to improve.   Orders:  No orders of the defined types were placed in this encounter.  No orders of the defined types were placed in this encounter.     Procedures: No procedures performed   Clinical Data: No additional findings.   Subjective: Chief Complaint  Patient presents with  . Left Wrist - Carpal Tunnel, Routine Post Op    Brenda Brenda is a 61 y o S/P 11 days Left carpal tunnel release. Pt relates she incision is a little itchy but she hasnt taken pain meds, only ibuprofen    HPI  Review of Systems  Constitutional: Negative for chills, fatigue and fever.  Eyes: Negative for itching.  Respiratory: Negative for chest tightness and shortness of breath.   Cardiovascular: Negative for chest pain, palpitations and leg swelling.  Gastrointestinal: Negative for blood in stool, constipation and diarrhea.  Endocrine: Negative for polyuria.  Genitourinary: Negative for dysuria.  Musculoskeletal: Negative for back pain, joint swelling, neck pain and neck stiffness.  Allergic/Immunologic: Negative for immunocompromised state.  Neurological: Negative for dizziness and numbness.  Hematological: Does not bruise/bleed easily.  Psychiatric/Behavioral: The patient is not nervous/anxious.      Objective: Vital Signs: BP 132/80   Pulse 86   Resp 14   Ht  (1.6 m)   Wt  190 lb (86.2 kg)   BMI 33.66 kg/m   Physical Exam  Ortho Exam awake alert and oriented 3 comfortable sitting. Stitches removed from left carpal tunnel incision. There is some overlap on the distal aspect of the wound. These were Steri-Stripped and with benzoin. Neurovascular exam intact. Full range of motion of fingers  Specialty Comments:  No specialty comments available.  Imaging: No results found.   PMFS History: Patient Active Problem List   Diagnosis Date Noted  . Carpal tunnel syndrome, left upper limb 02/18/2017  . Pain in left hip 04/23/2016  . Pain in both upper extremities 04/23/2016  . Mild intermittent asthma 06/01/2015  . LPRD (laryngopharyngeal reflux disease) 06/01/2015  . Allergic rhinoconjunctivitis 06/01/2015  . Morbid obesity (HCC) 04/02/2015  . GERD (gastroesophageal reflux disease) 03/16/2015  . Upper airway cough syndrome 03/16/2015   Past Medical History:  Diagnosis Date  . Allergic rhinitis    per Dr. Lucie Leather  . Asthma    per Dr. Lucie Leather  . DJD (degenerative joint disease)   . GERD (gastroesophageal reflux disease)   . Hyperlipidemia   . Hypertension     Family History  Problem Relation Age of Onset  . Heart disease Father   . Allergies Sister   . Eczema Sister   . Asthma Sister   . Hypertension Unknown        x 4 siblings    Past Surgical History:  Procedure Laterality Date  . APPENDECTOMY    . BRAVO PH STUDY N/A 03/16/2015   Procedure: BRAVO PH STUDY;  Surgeon: Charlott Rakes, MD;  Location: [facility];  Service: Endoscopy;  Laterality: N/A;  . CESAREAN SECTION    . ESOPHAGOGASTRODUODENOSCOPY N/A 03/16/2015   Procedure: ESOPHAGOGASTRODUODENOSCOPY (EGD);  Surgeon: Charlott Rakes, MD;  Location: [facility];  Service: Endoscopy;  Laterality: N/A;  . MYOMECTOMY    . TEMPOROMANDIBULAR JOINT SURGERY Right   . TONSILLECTOMY     Social History   Occupational History  . judge    Social History Main Topics  . Smoking status:  Never Smoker  . Smokeless tobacco: Never Used  . Alcohol use 0.0 oz/week     Comment: glass a wine a day  . Drug use: No  . Sexual activity: Yes

## 2017-06-03 ENCOUNTER — Encounter: Payer: Self-pay | Admitting: Family Medicine

## 2017-06-03 ENCOUNTER — Ambulatory Visit (INDEPENDENT_AMBULATORY_CARE_PROVIDER_SITE_OTHER): Payer: No Typology Code available for payment source | Admitting: Family Medicine

## 2017-06-03 VITALS — BP 115/80 | HR 82 | Temp 98.0°F | Resp 16

## 2017-06-03 DIAGNOSIS — J4521 Mild intermittent asthma with (acute) exacerbation: Secondary | ICD-10-CM

## 2017-06-03 DIAGNOSIS — K219 Gastro-esophageal reflux disease without esophagitis: Secondary | ICD-10-CM

## 2017-06-03 DIAGNOSIS — J309 Allergic rhinitis, unspecified: Secondary | ICD-10-CM | POA: Diagnosis not present

## 2017-06-03 DIAGNOSIS — H1010 Acute atopic conjunctivitis, unspecified eye: Secondary | ICD-10-CM | POA: Diagnosis not present

## 2017-06-03 MED ORDER — FLUTICASONE PROPIONATE HFA 110 MCG/ACT IN AERO: INHALATION_SPRAY | RESPIRATORY_TRACT | 5 refills | Status: DC

## 2017-06-03 MED ORDER — ALBUTEROL SULFATE (2.5 MG/3ML) 0.083% IN NEBU: 2.5000 mg | INHALATION_SOLUTION | Freq: Four times a day (QID) | RESPIRATORY_TRACT | 3 refills | Status: DC | PRN

## 2017-06-03 NOTE — Progress Notes (Signed)
[address] [place] [address] Dept: [phone]  FAMILY NURSE PRACTITIONER FOLLOW UP NOTE  Patient ID: Brenda Daniel, female    DOB: 1955-11-20  Age: 62 y.o. MRN: [id number] Date of Office Visit: 06/03/2017  Assessment  Chief Complaint: Cough (patient stated that eating,talking and cold air makes it worse. been going on since october)  HPI Brenda Daniel is a 62 year old female who presents to the clinic for a sick visit.  She was last seen in this clinic on 4/13//2018 by Dr. Delorse Lek for evaluation of asthma exacerbation, allergic rhinoconjunctivitis, and LPR.  At that time, she reported a cough that hurt in the middle of her chest that felt like a spasm and required prednisone, montelukast, esomeprazole, zantac,  and Flovent for resolution.  Today's visit, she reports a dry hacking cough that began in October and worsened over the winter holidays.  She reports the cough as worse in the daytime, during cold weather, and while talking.  She reports occasional wheezing and shortness of breath with activity as well as the dry cough.  In October, she began taking Allegra 180, montelukast 10 mg, Delsym, Nexium, ranitidine, and Tessalon Perles with no significant relief.  She has not used any asthma controller inhalers, however, she has used the albuterol inhaler about once a week with no improvement noted. She reports that she gets this cough about 1-2 times a year and the only thing that has worked previously is a prednisone taper.   Drug Allergies:  Allergies  Allergen Reactions  . Advil [Ibuprofen]     dizzy  . Niacin And Related     dizzy  . Red Yeast Rice [Cholestin]     dizzy    Physical Exam: BP 115/80 (BP Location: Left Arm, Cuff Size: Normal)   Pulse 82   Temp 98 F (36.7 C) (Oral)   Resp 16   SpO2 97%    Physical Exam  Constitutional: She is oriented to person, place, and time. She appears well-developed and well-nourished.  HENT:  Right Ear:  External ear normal.  Left Ear: External ear normal.  Ears normal.  Eyes normal.  Pharynx slightly erythematous with no exudate noted.  Nares normal.  Eyes: Conjunctivae are normal.  Neck: Normal range of motion. Neck supple.  Cardiovascular: Normal rate, regular rhythm and normal heart sounds.  S1-S2 normal.  Regular heart rate and rhythm.  No murmur noted.  Pulmonary/Chest: Effort normal and breath sounds normal.  Lungs clear to auscultation  Musculoskeletal: Normal range of motion.  Neurological: She is alert and oriented to person, place, and time.  Skin: Skin is warm and dry.  Psychiatric: She has a normal mood and affect. Her behavior is normal.    Diagnostics: FVC 2.69, FEV1 2.17.  Predicted FVC 3.33.  Predicted FEV1 2.57.  Spirometry is within the normal range.  Assessment and Plan: 1. Mild intermittent asthma with acute exacerbation   2. LPRD (laryngopharyngeal reflux disease)   3. Allergic rhinoconjunctivitis     Meds ordered this encounter  Medications  . fluticasone (FLOVENT HFA) 110 MCG/ACT inhaler    Sig: Inhale two puffs twice daily during flare    Dispense:  1 Inhaler    Refill:  5  . albuterol (PROVENTIL) (2.5 MG/3ML) 0.083% nebulizer solution    Sig: Take 3 mLs (2.5 mg total) by nebulization every 6 (six) hours as needed for wheezing or shortness of breath.    Dispense:  90 mL    Refill:  3  Patient Instructions    1. Continue to Treat reflux with Nexium 40 mg once a day    2. Action plan for respiratory flare:   A. Nexium 40 mg twice a day and add Ranitidine 300 in evening  B. Begin Flovent 110 -2 inhalations twice a day with a spacer  C. use ProAir HFA 2 puffs or albuterol 1 vial every 4 hours as needed            D.  If no improvement with the use of Flovent: Take prednisone daily pack - 20mg  twice a day x 3 days then 20mg  x 1 day then 10mg  x 1 day then stop.      3. Continue Flonase one-2 sprays each nostril one time per day   4. Continue  Montelukast 10mg  one time per day  5. If needed:   A. Antihistamine   B. Drink with plenty of water           C. Tessalon perles 100mg  3 times a day as needed for cough  6. Return to clinic in 6 months or earlier if her symptoms persist, progress, or if she becomes febrile.    Return in about 6 months (around 12/01/2017), or if symptoms worsen or fail to improve.   Santina EvansCatherine agrees to start and use an asthma controller medicine, Flovent, for the next 2 weeks. If the Flovent, in combination with her other medications for airway inflammation reduction, is not providing relief then she may take the prednisone taper that was provided at today's visit. We have discussed, and she has verbalized understanding, the risks, benefits, and side effects pf oral prednisone.   Thank you for the opportunity to care for this patient.  Please do not hesitate to contact me with questions.  Thermon LeylandAnne Jawanna Dykman, FNP [facility]   I have provided oversight concerning [facility]'s evaluation and treatment of this patient's health issues addressed during today's encounter.  I agree with the assessment and therapeutic plan as outlined in the note.   Signed,   R Jorene Guestarter Bobbitt, MD

## 2017-06-03 NOTE — Patient Instructions (Addendum)
   1. Continue to Treat reflux with Nexium 40 mg once a day    2. Action plan for respiratory flare:   A. Nexium 40 mg twice a day and add Ranitidine 300 in evening  B. Begin Flovent 110 -2 inhalations twice a day with a spacer  C. use ProAir HFA 2 puffs or albuterol 1 vial every 4 hours as needed            D.  If no improvement with the use of Flovent: Take prednisone daily pack - 20mg  twice a day x 3 days then 20mg  x 1 day then 10mg  x 1 day then stop.      3. Continue Flonase one-2 sprays each nostril one time per day   4. Continue Montelukast 10mg  one time per day  5. If needed:   A. Antihistamine   B. Drink with plenty of water           C. Tessalon perles 100mg  3 times a day as needed for cough  6. Return to clinic in 6 months or earlier if problem

## 2017-06-20 ENCOUNTER — Other Ambulatory Visit: Payer: Self-pay | Admitting: Allergy and Immunology

## 2017-06-24 ENCOUNTER — Encounter (INDEPENDENT_AMBULATORY_CARE_PROVIDER_SITE_OTHER): Payer: Self-pay | Admitting: Orthopaedic Surgery

## 2017-06-24 ENCOUNTER — Ambulatory Visit (INDEPENDENT_AMBULATORY_CARE_PROVIDER_SITE_OTHER): Payer: No Typology Code available for payment source | Admitting: Orthopaedic Surgery

## 2017-06-24 VITALS — BP 136/88 | HR 86 | Resp 16 | Ht 63.0 in | Wt 200.0 lb

## 2017-06-24 DIAGNOSIS — M25551 Pain in right hip: Secondary | ICD-10-CM

## 2017-06-24 DIAGNOSIS — M25552 Pain in left hip: Secondary | ICD-10-CM

## 2017-06-24 MED ORDER — CYCLOBENZAPRINE HCL 5 MG PO TABS: ORAL_TABLET | ORAL | 0 refills | Status: DC

## 2017-06-24 NOTE — Progress Notes (Signed)
Office Visit Note   Patient: Brenda Daniel           Date of Birth: [birth year]           MRN: [id number] Visit Date: 06/24/2017              Requested by: Laurann MontanaWhite, Cynthia, MD [phone] [place], [place] [id number] PCP: Laurann MontanaWhite, Cynthia, MD   Assessment & Plan: Visit Diagnoses:  1. Pain of right hip joint   2. Pain in left hip     Plan: Right hamstring strain and continue with physical therapy, Flexeril and Voltaren gel. No evidence of etiology from lumbar spine or hip joint.  Follow-Up Instructions: Return if symptoms worsen or fail to improve.   Orders:  No orders of the defined types were placed in this encounter.  Meds ordered this encounter  Medications  . cyclobenzaprine (FLEXERIL) 5 MG tablet    Sig: 1 PO TID prn    Dispense:  30 tablet    Refill:  0      Procedures: No procedures performed   Clinical Data: No additional findings.   Subjective: Chief Complaint  Patient presents with  . Right Hip - Pain    [name] is a 62 y  o here today for recurrent Right hamstring pain. Started in Sept. Became better and 3 weeks the pain is back. She cannot walk up/down stairs.  No specific history of injury or trauma. No numbness or tingling or back pain. No pain on the left. No groin discomfort. Working with physical therapy  HPI  Review of Systems  Constitutional: Negative for chills, fatigue and fever.  Eyes: Negative for itching.  Respiratory: Positive for cough. Negative for chest tightness and shortness of breath.   Cardiovascular: Negative for chest pain, palpitations and leg swelling.  Gastrointestinal: Negative for blood in stool, constipation and diarrhea.  Endocrine: Negative for polyuria.  Genitourinary: Negative for dysuria.  Musculoskeletal: Positive for back pain and neck stiffness. Negative for joint swelling and neck pain.  Allergic/Immunologic: Negative for immunocompromised state.  Neurological: Negative for dizziness and  numbness.  Hematological: Does not bruise/bleed easily.  Psychiatric/Behavioral: Positive for sleep disturbance. The patient is not nervous/anxious.      Objective: Vital Signs: BP 136/88   Pulse 86   Resp 16   Ht 5\' 3"  (1.6 m)   Wt 200 lb (90.7 kg)   BMI 35.43 kg/m   Physical Exam  Ortho Exam awake alert and oriented 3 comfortable sitting. No pain with range of motion of either hip i.e. groin pain. Straight leg raise negative. Minimal discomfort over the ischial tuberosity on the right. Hamstring tightness proximally. No masses.. Skin intact. Neurovascular exam intact. Does have some pain in the right proximal hamstring with lunges and leg extensions  Specialty Comments:  No specialty comments available.  Imaging: No results found.   PMFS History: Patient Active Problem List   Diagnosis Date Noted  . Carpal tunnel syndrome, left upper limb 02/18/2017  . Pain in left hip 04/23/2016  . Pain in both upper extremities 04/23/2016  . Mild intermittent asthma with acute exacerbation 06/01/2015  . LPRD (laryngopharyngeal reflux disease) 06/01/2015  . Allergic rhinoconjunctivitis 06/01/2015  . Morbid obesity (HCC) 04/02/2015  . GERD (gastroesophageal reflux disease) 03/16/2015  . Upper airway cough syndrome 03/16/2015   Past Medical History:  Diagnosis Date  . Allergic rhinitis    per Dr. Lucie LeatherKozlow  . Asthma    per Dr. Lucie LeatherKozlow  .  DJD (degenerative joint disease)   . GERD (gastroesophageal reflux disease)   . Hyperlipidemia   . Hypertension     Family History  Problem Relation Age of Onset  . Heart disease Father   . Allergies Sister   . Eczema Sister   . Asthma Sister   . Hypertension Unknown        x 4 siblings    Past Surgical History:  Procedure Laterality Date  . APPENDECTOMY    . BRAVO PH STUDY N/A 03/16/2015   Procedure: BRAVO PH STUDY;  Surgeon: Charlott Rakes, MD;  Location: [facility];  Service: Endoscopy;  Laterality: N/A;  . CESAREAN SECTION    .  ESOPHAGOGASTRODUODENOSCOPY N/A 03/16/2015   Procedure: ESOPHAGOGASTRODUODENOSCOPY (EGD);  Surgeon: Charlott Rakes, MD;  Location: [facility];  Service: Endoscopy;  Laterality: N/A;  . MYOMECTOMY    . TEMPOROMANDIBULAR JOINT SURGERY Right   . TONSILLECTOMY     Social History   Occupational History  . Occupation: judge  Tobacco Use  . Smoking status: Never Smoker  . Smokeless tobacco: Never Used  Substance and Sexual Activity  . Alcohol use: Yes    Alcohol/week: 0.0 oz    Comment: glass a wine a day  . Drug use: No  . Sexual activity: Yes

## 2017-08-14 ENCOUNTER — Other Ambulatory Visit: Payer: Self-pay | Admitting: Allergy and Immunology

## 2017-11-30 ENCOUNTER — Emergency Department (HOSPITAL_COMMUNITY): Payer: No Typology Code available for payment source

## 2017-11-30 ENCOUNTER — Encounter (HOSPITAL_COMMUNITY): Payer: Self-pay

## 2017-11-30 ENCOUNTER — Other Ambulatory Visit: Payer: Self-pay

## 2017-11-30 ENCOUNTER — Emergency Department (HOSPITAL_COMMUNITY)
Admission: EM | Admit: 2017-11-30 | Discharge: 2017-11-30 | Disposition: A | Discharge status: Home / Self Care | Payer: No Typology Code available for payment source | Attending: Emergency Medicine | Admitting: Emergency Medicine

## 2017-11-30 DIAGNOSIS — R079 Chest pain, unspecified: Secondary | ICD-10-CM | POA: Diagnosis present

## 2017-11-30 DIAGNOSIS — Z79899 Other long term (current) drug therapy: Secondary | ICD-10-CM | POA: Insufficient documentation

## 2017-11-30 DIAGNOSIS — R0789 Other chest pain: Secondary | ICD-10-CM | POA: Diagnosis not present

## 2017-11-30 DIAGNOSIS — J45909 Unspecified asthma, uncomplicated: Secondary | ICD-10-CM | POA: Diagnosis not present

## 2017-11-30 DIAGNOSIS — R05 Cough: Secondary | ICD-10-CM | POA: Diagnosis not present

## 2017-11-30 DIAGNOSIS — R070 Pain in throat: Secondary | ICD-10-CM | POA: Diagnosis not present

## 2017-11-30 DIAGNOSIS — J3489 Other specified disorders of nose and nasal sinuses: Secondary | ICD-10-CM | POA: Diagnosis not present

## 2017-11-30 DIAGNOSIS — R0981 Nasal congestion: Secondary | ICD-10-CM | POA: Insufficient documentation

## 2017-11-30 DIAGNOSIS — I10 Essential (primary) hypertension: Secondary | ICD-10-CM | POA: Diagnosis not present

## 2017-11-30 DIAGNOSIS — R053 Chronic cough: Secondary | ICD-10-CM

## 2017-11-30 LAB — CBC
HEMATOCRIT: 43 % (ref 36.0–46.0)
Hemoglobin: 14 g/dL (ref 12.0–15.0)
MCH: 30.2 pg (ref 26.0–34.0)
MCHC: 32.6 g/dL (ref 30.0–36.0)
MCV: 92.7 fL (ref 78.0–100.0)
Platelets: 265 10*3/uL (ref 150–400)
RBC: 4.64 MIL/uL (ref 3.87–5.11)
RDW: 13.1 % (ref 11.5–15.5)
WBC: 5.6 10*3/uL (ref 4.0–10.5)

## 2017-11-30 LAB — I-STAT TROPONIN, ED
Troponin i, poc: 0 ng/mL (ref 0.00–0.08)
Troponin i, poc: 0 ng/mL (ref 0.00–0.08)

## 2017-11-30 LAB — BASIC METABOLIC PANEL
ANION GAP: 10 (ref 5–15)
BUN: 13 mg/dL (ref 6–20)
CHLORIDE: 102 mmol/L (ref 98–111)
CO2: 28 mmol/L (ref 22–32)
Calcium: 9.5 mg/dL (ref 8.9–10.3)
Creatinine, Ser: 0.87 mg/dL (ref 0.44–1.00)
GFR calc non Af Amer: >60 mL/min (ref >60)
Glucose, Bld: 121 mg/dL — ABNORMAL HIGH (ref 70–99)
Potassium: 3.5 mmol/L (ref 3.5–5.1)
SODIUM: 140 mmol/L (ref 135–145)

## 2017-11-30 LAB — D-DIMER, QUANTITATIVE (NOT AT ARMC): D DIMER QUANT: 0.51 ug{FEU}/mL — AB (ref 0.00–0.50)

## 2017-11-30 MED ORDER — PREDNISONE 10 MG PO TABS: 40.0000 mg | ORAL_TABLET | Freq: Every day | ORAL | 0 refills | Status: AC

## 2017-11-30 MED ORDER — IPRATROPIUM-ALBUTEROL 0.5-2.5 (3) MG/3ML IN SOLN
3.0000 mL | Freq: Once | RESPIRATORY_TRACT | Status: AC
Start: 2017-11-30 — End: 2017-11-30
  Administered 2017-11-30: 3 mL via RESPIRATORY_TRACT
  Filled 2017-11-30: qty 3

## 2017-11-30 MED ORDER — PREDNISONE 20 MG PO TABS
60.0000 mg | ORAL_TABLET | Freq: Once | ORAL | Status: AC
  Administered 2017-11-30: 60 mg via ORAL
  Filled 2017-11-30: qty 3

## 2017-11-30 MED ORDER — BENZONATATE 100 MG PO CAPS: 100.0000 mg | ORAL_CAPSULE | Freq: Three times a day (TID) | ORAL | 0 refills | Status: DC | PRN

## 2017-11-30 MED ORDER — BENZONATATE 100 MG PO CAPS
200.0000 mg | ORAL_CAPSULE | Freq: Once | ORAL | Status: AC
  Administered 2017-11-30: 200 mg via ORAL
  Filled 2017-11-30: qty 2

## 2017-11-30 NOTE — Discharge Instructions (Signed)
You were seen in the ER for chest tightness.    Your work-up, lab work, heart enzymes, chest x-ray were normal.  There are some nonspecific changes to your EKG.  Your test to rule out a blood clot was negative.    Your symptoms improved after nebulizing treatment, cough medicine and prednisone.  I suspect the tightness may be from an asthma flare or bronchospasms.  Take prednisone and Tessalon Perles over the next 5 days.  Continue inhalers and nebulizing treatments for asthma.    Follow-up with your allergist in the next 1 week if symptoms are not improving or resolved.  Return to the ER for worsening symptoms, pain in your chest with exertion or activity, shortness of breath, fevers, chills, lightheadedness, leg swelling or calf pain.

## 2017-11-30 NOTE — ED Triage Notes (Signed)
Pt presents for evaluation of chest pain x 5 days. Reports sore throat and dry cough x 5 days as well. States was checked for strep by PCP and was negative.

## 2017-11-30 NOTE — ED Notes (Signed)
The pt has nopain she is wanting to go home  nsr on monitor

## 2017-11-30 NOTE — ED Notes (Signed)
Pt sleeping. 

## 2017-11-30 NOTE — ED Provider Notes (Signed)
[facility] EMERGENCY DEPARTMENT Provider Note   CSN: [id number] Arrival date & time: 11/30/17  1151     History   Chief Complaint Chief Complaint  Patient presents with  . Chest Pain    HPI Brenda Daniel is a 62 y.o. female with history of GERD, hypertension, hyperlipidemia, asthma, allergic rhinitis is here for evaluation of chest tightness, central, intermittent, lasting a few seconds, nonradiating, nonpleuritic.  Chest tightness began 7 days ago.  Associated with sore throat, nasal congestion, rhinorrhea, dry and persistent cough x 5 days.  She has been without her asthma medications until 5 days ago when her cough began.  Has been using Singulair, Flovent since with minimal relief of the chest discomfort and cough.  States her typical asthma symptoms include persistent cough and minimal wheezing.  She does states in the past she has had chest discomfort with asthma flares and cough, previous chart notes from allergist confirms this. She was around her son last week at the beach who also had a cough.  She went to see PCP today who did a strep test that was negative.  States that her PCP got an EKG and due to her risk factors was recommended to come to the ER for evaluation of chest discomfort.  No history of tobacco use.  Known cardiac history and family, states that her father died at age 19 from a heart attack, her brothers have had heart attacks below the age of 72.  Recent 6-hour car trip to the [employer] last week.  Chart review shows that she is seen by allergist for recurrent chronic cough versus GERD.  No h/o DVT/PE, estrogen use, leg swelling or calf pain, malignancy.   HPI  Past Medical History:  Diagnosis Date  . Allergic rhinitis    per Dr. Lucie Leather  . Asthma    per Dr. Lucie Leather  . DJD (degenerative joint disease)   . GERD (gastroesophageal reflux disease)   . Hyperlipidemia   . Hypertension     Patient Active Problem List   Diagnosis Date Noted    . Carpal tunnel syndrome, left upper limb 02/18/2017  . Pain in left hip 04/23/2016  . Pain in both upper extremities 04/23/2016  . Mild intermittent asthma with acute exacerbation 06/01/2015  . LPRD (laryngopharyngeal reflux disease) 06/01/2015  . Allergic rhinoconjunctivitis 06/01/2015  . Morbid obesity (HCC) 04/02/2015  . GERD (gastroesophageal reflux disease) 03/16/2015  . Upper airway cough syndrome 03/16/2015    Past Surgical History:  Procedure Laterality Date  . APPENDECTOMY    . BRAVO PH STUDY N/A 03/16/2015   Procedure: BRAVO PH STUDY;  Surgeon: Charlott Rakes, MD;  Location: [facility] ENDOSCOPY;  Service: Endoscopy;  Laterality: N/A;  . CESAREAN SECTION    . ESOPHAGOGASTRODUODENOSCOPY N/A 03/16/2015   Procedure: ESOPHAGOGASTRODUODENOSCOPY (EGD);  Surgeon: Charlott Rakes, MD;  Location: [facility];  Service: Endoscopy;  Laterality: N/A;  . MYOMECTOMY    . TEMPOROMANDIBULAR JOINT SURGERY Right   . TONSILLECTOMY       OB History   None      Home Medications    Prior to Admission medications   Medication Sig Start Date End Date Taking? Authorizing Provider  Cholecalciferol (VITAMIN D PO) Take 1 tablet by mouth See admin instructions. Take one tablet by mouth daily on Monday thru Friday (skip Saturday and Sunday)   Yes [provider]  clobetasol cream (TEMOVATE) 0.05 % Apply 1 application topically at bedtime as needed (itching on left nipple).  Yes [provider]  diclofenac sodium (VOLTAREN) 1 % GEL Apply 2 g topically 4 (four) times daily. Patient taking differently: Apply 1 application topically 2 (two) times daily as needed (arthritis pain).  04/23/16  Yes Valeria Batman, MD  esomeprazole (NEXIUM) 40 MG capsule Take 40 mg by mouth daily as needed (acid reflux/cough).   Yes [provider]  fluticasone (FLONASE) 50 MCG/ACT nasal spray Place 1 spray into both nostrils daily.   Yes [provider]  fluticasone (FLOVENT HFA)  110 MCG/ACT inhaler Inhale two puffs twice daily during flare Patient taking differently: Inhale 2 puffs into the lungs 2 (two) times daily as needed (cough).  06/03/17  Yes Ambs, Norvel Richards, FNP  ipratropium-albuterol (DUONEB) 0.5-2.5 (3) MG/3ML SOLN Take 3 mLs by nebulization every 4 (four) hours as needed. Patient taking differently: Take 3 mLs by nebulization every 4 (four) hours as needed (shortness of breath/ wheezing).  08/31/16  Yes Padgett, Pilar Grammes, MD  loratadine (CLARITIN) 10 MG tablet Take 10 mg by mouth at bedtime as needed (seasonal allergies).   Yes [provider]  losartan (COZAAR) 100 MG tablet Take 100 mg by mouth daily. 05/21/17  Yes [provider]  montelukast (SINGULAIR) 10 MG tablet Take one tablet once daily Patient taking differently: Take 10 mg by mouth at bedtime as needed (cough).  07/17/16  Yes Kozlow, Alvira Philips, MD  POTASSIUM PO Take 1 tablet by mouth at bedtime.   Yes [provider]  ranitidine (ZANTAC) 300 MG tablet TAKE 1 TABLET EVERY EVENING DURING FLARE AS DIRECTED Patient taking differently: TAKE 1 TABLET EVERY EVENING DURING FLARE AS DIRECTED (AS NEEDED FOR COUGH) 08/14/17  Yes Kozlow, Alvira Philips, MD  traZODone (DESYREL) 50 MG tablet Take 25 mg by mouth at bedtime.   Yes [provider]  triamterene-hydrochlorothiazide (MAXZIDE) 75-50 MG tablet Take 1 tablet by mouth daily.   Yes [provider]  valACYclovir (VALTREX) 500 MG tablet Take 500 mg by mouth See admin instructions. Take one tablet (500 mg) by mouth daily at bedtime, take four tablets (2000 mg) once as one dose as needed at onset of fever blister   Yes [provider]  albuterol (PROAIR HFA) 108 (90 Base) MCG/ACT inhaler 2 puffs every 4-6 hours as needed for cough or wheezing. Patient not taking: Reported on 11/30/2017 08/31/16   Marcelyn Bruins, MD  albuterol (PROVENTIL) (2.5 MG/3ML) 0.083% nebulizer solution Take 3 mLs (2.5 mg total) by  nebulization every 6 (six) hours as needed for wheezing or shortness of breath. Patient not taking: Reported on 11/30/2017 06/03/17   Hetty Blend, FNP  benzonatate (TESSALON PERLES) 100 MG capsule Take 1 capsule (100 mg total) by mouth 3 (three) times daily as needed for cough. FOR DISRUPTIVE DAY TIME COUGH 11/30/17   Liberty Handy, PA-C  cyclobenzaprine (FLEXERIL) 5 MG tablet 1 PO TID prn Patient not taking: Reported on 11/30/2017 06/24/17   Valeria Batman, MD  predniSONE (DELTASONE) 10 MG tablet Take 4 tablets (40 mg total) by mouth daily for 5 days. 11/30/17 12/05/17  Liberty Handy, PA-C    Family History Family History  Problem Relation Age of Onset  . Heart disease Father   . Allergies Sister   . Eczema Sister   . Asthma Sister   . Hypertension Unknown        x 4 siblings    Social History Social History   Tobacco Use  . Smoking status: Never Smoker  .  Smokeless tobacco: Never Used  Substance Use Topics  . Alcohol use: Yes    Alcohol/week: 0.0 oz    Comment: glass a wine a day  . Drug use: No     Allergies   Advil [ibuprofen]; Niacin and related; and Red yeast rice [cholestin]   Review of Systems Review of Systems  HENT: Positive for postnasal drip, rhinorrhea and sore throat.   Respiratory: Positive for cough and chest tightness.   All other systems reviewed and are negative.    Physical Exam Updated Vital Signs BP 115/74   Pulse 90   Temp 97.8 F (36.6 C) (Oral)   Resp 17   SpO2 98%   Physical Exam  Constitutional: She appears well-developed and well-nourished.  NAD. Non toxic.   HENT:  Head: Normocephalic and atraumatic.  Nose: Nose normal.  Mouth/Throat: Posterior oropharyngeal erythema present.  Moist mucous membranes. No tonsillary hypertrophy or exudates. Uvula midline. No trismus. No SL edema or tenderness. No mucosal edema.   Eyes: Conjunctivae, EOM and lids are normal.  Neck: Trachea normal and normal range of motion.  Trachea  midline. No cervical adenopathy  Cardiovascular: Normal rate, regular rhythm, S1 normal, S2 normal and normal heart sounds.  Pulses:      Radial pulses are 2+ on the right side, and 2+ on the left side.       Dorsalis pedis pulses are 2+ on the right side, and 2+ on the left side.  "Soreness" to sternum and sternal borders. No pain with deep inspiration. No pain with active ROM of upper extremities or sitting up on bed. RRR. No LE edema or calf tenderness.   Pulmonary/Chest: Effort normal and breath sounds normal. No respiratory distress.  No wheezing  Abdominal: Soft. Bowel sounds are normal. There is no tenderness.  No epigastric tenderness. NTND  Neurological: She is alert. GCS eye subscore is 4. GCS verbal subscore is 5. GCS motor subscore is 6.  Skin: Skin is warm and dry. Capillary refill takes less than 2 seconds.  No rash to chest wall  Psychiatric: She has a normal mood and affect. Her speech is normal and behavior is normal. Judgment and thought content normal. Cognition and memory are normal.     ED Treatments / Results  Labs (all labs ordered are listed, but only abnormal results are displayed) Labs Reviewed  BASIC METABOLIC PANEL - Abnormal; Notable for the following components:      Result Value   Glucose, Bld 121 (*)    All other components within normal limits  CBC  D-DIMER, QUANTITATIVE (NOT AT [facility])  I-STAT TROPONIN, ED  I-STAT TROPONIN, ED    EKG EKG Interpretation  Date/Time:  Saturday November 30 2017 11:55:31 EDT Ventricular Rate:  80 PR Interval:  158 QRS Duration: 84 QT Interval:  504 QTC Calculation: 581 R Axis:   127 Text Interpretation:  Normal sinus rhythm Low voltage QRS Left posterior fascicular block Nonspecific ST and T wave abnormality Prolonged QT Abnormal ECG No old tracing to compare Confirmed by Shaune Pollack [phone]) on 11/30/2017 2:13:49 PM   Radiology Dg Chest 2 View  Result Date: 11/30/2017 CLINICAL DATA:  Chest pain, sore throat and  cough. EXAM: CHEST - 2 VIEW COMPARISON:  06/02/2004 FINDINGS: Stable tortuosity of the thoracic aorta. The heart size is normal. There is no evidence of pulmonary edema, consolidation, pneumothorax, nodule or pleural fluid. The bony thorax is unremarkable. IMPRESSION: No active cardiopulmonary disease. Electronically Signed   By: Irish Lack  M.D.   On: 11/30/2017 12:19    Procedures Procedures (including critical care time)  Medications Ordered in ED Medications  ipratropium-albuterol (DUONEB) 0.5-2.5 (3) MG/3ML nebulizer solution 3 mL (3 mLs Nebulization Given 11/30/17 1427)  predniSONE (DELTASONE) tablet 60 mg (60 mg Oral Given 11/30/17 1427)  benzonatate (TESSALON) capsule 200 mg (200 mg Oral Given 11/30/17 1427)     Initial Impression / Assessment and Plan / ED Course  I have reviewed the triage vital signs and the nursing notes.  Pertinent labs & imaging results that were available during my care of the patient were reviewed by me and considered in my medical decision making (see chart for details).  Clinical Course as of Dec 01 1598  Sat Nov 30, 2017  1527 Normal sinus rhythm Low voltage QRS Left posterior fascicular block Nonspecific ST and T wave abnormality Prolonged QT Abnormal ECG No old tracing to compare Confirmed by Shaune PollackIsaacs, Cameron [phone]([id number]) on 11/30/2017 2:13:49 PM  ED EKG within 10 minutes [CG]    Clinical Course User Index [CG] Liberty HandyGibbons, Caragh Gasper J, PA-C   62 year old female here with chest tightness.  Symptoms intermittent, nonpleuritic, nonexertional.  In setting of rhinorrhea, sore throat, cough consistent with previous asthma flares.  Pertinent risk factors include hypertension, hyperlipidemia, elevated BMI, positive family history.  Recent prolonged travel however no pleuritic component to the pain, tachycardia, tachypnea, hypoxia, lower extremity edema or calf tenderness.  Cardiopulmonary exam remarkable for mild sternal border tenderness.  EKG shows nonspecific  ST abnormalities, no previous tracings to compare.  Chest x-ray, troponin  X 2 WNL. CBC and BMP unremarkable.  Patient was given DuoNeb, prednisone, Tessalon Perles.  She does feel like her chest tightness has improved.  Wells score is low however given recent prolonged travel, d-dimer will be added.   1556: Pt will be handed off to oncoming EDPA who will f/u on d-dimer. Pt HEART score is 4 given age, pmh and family history however her symptomatology is atypical sounding.  Feel patient is adequate for dc if remaining labs reassuring. Tx for bronchitis vs asthma flare. Recommendation to follow up with allergist and cardiologist in regards to today's hospital visit in the next 30 days if chest tightness persists. ED return preacutions given.  Final Clinical Impressions(s) / ED Diagnoses   Final diagnoses:  Chest tightness  Persistent cough    ED Discharge Orders        Ordered    predniSONE (DELTASONE) 10 MG tablet  Daily     11/30/17 1600    benzonatate (TESSALON PERLES) 100 MG capsule  3 times daily PRN     11/30/17 1600       Liberty HandyGibbons, Keeara Frees J, PA-C 11/30/17 1600    Shaune PollackIsaacs, Cameron, MD 12/01/17 1213

## 2017-11-30 NOTE — ED Provider Notes (Signed)
Received sign out from Brenda Daniel at the beginning of shift.  Pt here with chest tightness.  Work up fairly unremarkable.  Mildly elevated D-dimer of 0.51 however it is within normal limit after age-adjusted.  Pt received treatment for bronchitis vs. Asthma flare.  Return precaution given.  She is afebrile, VSS  BP 115/74   Pulse 90   Temp 97.8 F (36.6 C) (Oral)   Resp 17   SpO2 98%   Results for orders placed or performed during the hospital encounter of 11/30/17  Basic metabolic panel  Result Value Ref Range   Sodium 140 135 - 145 mmol/L   Potassium 3.5 3.5 - 5.1 mmol/L   Chloride 102 98 - 111 mmol/L   CO2 28 22 - 32 mmol/L   Glucose, Bld 121 (H) 70 - 99 mg/dL   BUN 13 6 - 20 mg/dL   Creatinine, Ser 1.610.87 0.44 - 1.00 mg/dL   Calcium 9.5 8.9 - 09.610.3 mg/dL   GFR calc non Af Amer >60 >60 mL/min   GFR calc Af Amer >60 >60 mL/min   Anion gap 10 5 - 15  CBC  Result Value Ref Range   WBC 5.6 4.0 - 10.5 K/uL   RBC 4.64 3.87 - 5.11 MIL/uL   Hemoglobin 14.0 12.0 - 15.0 g/dL   HCT 04.543.0 40.936.0 - 81.146.0 %   MCV 92.7 78.0 - 100.0 fL   MCH 30.2 26.0 - 34.0 pg   MCHC 32.6 30.0 - 36.0 g/dL   RDW 91.413.1 78.211.5 - 95.615.5 %   Platelets 265 150 - 400 K/uL  D-dimer, quantitative (not at [facility])  Result Value Ref Range   D-Dimer, Quant 0.51 (H) 0.00 - 0.50 ug/mL-FEU  I-stat troponin, ED  Result Value Ref Range   Troponin i, poc 0.00 0.00 - 0.08 ng/mL   Comment 3          I-Stat Troponin, ED (not at [facility])  Result Value Ref Range   Troponin i, poc 0.00 0.00 - 0.08 ng/mL   Comment 3           Dg Chest 2 View  Result Date: 11/30/2017 CLINICAL DATA:  Chest pain, sore throat and cough. EXAM: CHEST - 2 VIEW COMPARISON:  06/02/2004 FINDINGS: Stable tortuosity of the thoracic aorta. The heart size is normal. There is no evidence of pulmonary edema, consolidation, pneumothorax, nodule or pleural fluid. The bony thorax is unremarkable. IMPRESSION: No active cardiopulmonary disease. Electronically Signed   By:  Irish LackGlenn  Yamagata M.D.   On: 11/30/2017 12:19      Fayrene Helperran, Ellery Tash, PA-C 11/30/17 1627    Loren RacerYelverton, David, MD 12/01/17 [phone]

## 2018-01-07 ENCOUNTER — Ambulatory Visit (INDEPENDENT_AMBULATORY_CARE_PROVIDER_SITE_OTHER): Payer: No Typology Code available for payment source | Admitting: Allergy and Immunology

## 2018-01-07 ENCOUNTER — Encounter: Payer: Self-pay | Admitting: Allergy and Immunology

## 2018-01-07 VITALS — BP 118/74 | HR 88 | Resp 18

## 2018-01-07 DIAGNOSIS — H1010 Acute atopic conjunctivitis, unspecified eye: Secondary | ICD-10-CM

## 2018-01-07 DIAGNOSIS — K219 Gastro-esophageal reflux disease without esophagitis: Secondary | ICD-10-CM

## 2018-01-07 DIAGNOSIS — Z8249 Family history of ischemic heart disease and other diseases of the circulatory system: Secondary | ICD-10-CM | POA: Diagnosis not present

## 2018-01-07 DIAGNOSIS — J309 Allergic rhinitis, unspecified: Secondary | ICD-10-CM | POA: Diagnosis not present

## 2018-01-07 DIAGNOSIS — J4540 Moderate persistent asthma, uncomplicated: Secondary | ICD-10-CM | POA: Diagnosis not present

## 2018-01-07 NOTE — Patient Instructions (Addendum)
   1. Continue to Treat reflux:   A. Nexium 40mg  2 times a day  B. Ranitidine 300 mg 1 time per day   2. Continue to treat inflammation:   A. Flonase 1-2 sprays each nostril one time per day  B. Montelukast 10mg  one time per day  C. Start Symbicort 160 - 2 inhalations 2 times a day with spacer  D. Flovent 110 - 2 inhalations 2 times per day with spacer  3. If needed:   A. Antihistamine   B. Proair HFA - 2 inhalations every 4-6 hours  C. Mucinex DM - 2 tablets two times per day  4. Return to clinic in 3 weeks or earlier if problem  5.  Obtain a exercise treadmill test
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
## 2018-01-08 ENCOUNTER — Encounter: Payer: Self-pay | Admitting: Allergy and Immunology

## 2018-01-08 MED ORDER — BENZONATATE 100 MG PO CAPS: 100.0000 mg | ORAL_CAPSULE | Freq: Three times a day (TID) | ORAL | 0 refills | Status: DC | PRN

## 2018-01-08 MED ORDER — BUDESONIDE-FORMOTEROL FUMARATE 160-4.5 MCG/ACT IN AERO: 2.0000 | INHALATION_SPRAY | Freq: Two times a day (BID) | RESPIRATORY_TRACT | 5 refills | Status: DC

## 2018-01-08 MED ORDER — FLUTICASONE PROPIONATE HFA 110 MCG/ACT IN AERO: 2.0000 | INHALATION_SPRAY | Freq: Two times a day (BID) | RESPIRATORY_TRACT | 5 refills | Status: DC | PRN

## 2018-01-08 MED ORDER — BENZONATATE 100 MG PO CAPS: 100.0000 mg | ORAL_CAPSULE | Freq: Three times a day (TID) | ORAL | 5 refills | Status: DC | PRN

## 2018-01-08 NOTE — Addendum Note (Signed)
Addended by: Mliss FritzBLACK, Burnadette Baskett I on: 01/08/2018 07:35 AM   Modules accepted: Orders

## 2018-01-16 ENCOUNTER — Telehealth: Payer: Self-pay | Admitting: Allergy and Immunology

## 2018-01-16 NOTE — Telephone Encounter (Signed)
She was started on Symbicort 160 on 01-07-18 by Dr. Lucie LeatherKozlow. Please advise and thank you.

## 2018-01-16 NOTE — Telephone Encounter (Signed)
Pt called and said that dr Lucie Leatherkozlow put her on Symbicort and she think it making her tongue burn. [phone].

## 2018-01-16 NOTE — Telephone Encounter (Signed)
[facility] two puffs BID. We have a sample at clinic.  Malachi BondsJoel Luceil Herrin, MD [facility]

## 2018-01-17 MED ORDER — MOMETASONE FURO-FORMOTEROL FUM 200-5 MCG/ACT IN AERO: 2.0000 | INHALATION_SPRAY | Freq: Two times a day (BID) | RESPIRATORY_TRACT | 5 refills | Status: DC

## 2018-01-17 NOTE — Telephone Encounter (Signed)
I left a detailed message for patient advising on medication change as well as that the new rx has been sent to the pharmacy

## 2018-01-17 NOTE — Addendum Note (Signed)
Addended by: Mliss FritzBLACK, Ac Colan I on: 01/17/2018 01:06 PM   Modules accepted: Orders

## 2018-01-21 ENCOUNTER — Telehealth: Payer: Self-pay | Admitting: *Deleted

## 2018-01-21 NOTE — Telephone Encounter (Signed)
Received Pa for [employer] 200 insurance prefers Advair, Engineer, materials and Symbicort (patient did not tolerate symbicort) Dr Dellis Anes please advise

## 2018-01-24 MED ORDER — FLUTICASONE FUROATE-VILANTEROL 200-25 MCG/INH IN AEPB: 1.0000 | INHALATION_SPRAY | Freq: Every day | RESPIRATORY_TRACT | 5 refills | Status: DC

## 2018-01-24 NOTE — Telephone Encounter (Signed)
Patient informed and advised. Prescription has been sent.

## 2018-01-24 NOTE — Addendum Note (Signed)
Addended by: Mliss Fritz I on: 01/24/2018 01:37 PM   Modules accepted: Orders

## 2018-01-24 NOTE — Telephone Encounter (Signed)
Let us change to Breo 200/25 mcg 1 puff once daily.  She can pick up a sample at the office if she needs to.  Malachi Bonds, MD [facility]

## 2018-02-18 ENCOUNTER — Ambulatory Visit: Payer: No Typology Code available for payment source | Admitting: Allergy and Immunology

## 2018-02-28 ENCOUNTER — Telehealth: Payer: Self-pay | Admitting: *Deleted

## 2018-02-28 NOTE — Telephone Encounter (Signed)
REFERRAL SENT TO SCHEDULING AND NOTES ON FILE FROM DR. CYNTHIA WHITE EAGLE AT [facility] [phone]. °

## 2018-03-05 ENCOUNTER — Telehealth: Payer: Self-pay

## 2018-03-05 NOTE — Telephone Encounter (Signed)
FAXED NOTES TO [facility] 

## 2018-03-18 ENCOUNTER — Encounter: Payer: Self-pay | Admitting: Allergy and Immunology

## 2018-03-18 ENCOUNTER — Ambulatory Visit (INDEPENDENT_AMBULATORY_CARE_PROVIDER_SITE_OTHER): Payer: No Typology Code available for payment source | Admitting: Allergy and Immunology

## 2018-03-18 VITALS — BP 126/74 | HR 80 | Resp 16 | Ht 62.0 in | Wt 183.4 lb

## 2018-03-18 DIAGNOSIS — J4550 Severe persistent asthma, uncomplicated: Secondary | ICD-10-CM

## 2018-03-18 DIAGNOSIS — J3089 Other allergic rhinitis: Secondary | ICD-10-CM | POA: Diagnosis not present

## 2018-03-18 DIAGNOSIS — Z8249 Family history of ischemic heart disease and other diseases of the circulatory system: Secondary | ICD-10-CM | POA: Diagnosis not present

## 2018-03-18 DIAGNOSIS — K219 Gastro-esophageal reflux disease without esophagitis: Secondary | ICD-10-CM | POA: Diagnosis not present

## 2018-03-18 NOTE — Patient Instructions (Addendum)
   1. Continue to Treat reflux:   A. Ranitidine 300 mg or famotidine 40 mg 1 time per day   B.  Can add Nexium 40 mg twice a day if needed  2. Continue to treat inflammation:   A. Flonase 1-2 sprays each nostril one time per day  B. Montelukast 10mg  one time per day  C. Flovent 110 - 2 inhalations 1-2 times per day with spacer  3. If needed:   A. Antihistamine   B. Proair HFA - 2 inhalations every 4-6 hours  C. Mucinex DM - 2 tablets two times per day  4. Return to clinic in 6 months or earlier if problem

## 2018-03-18 NOTE — Progress Notes (Signed)
Follow-up Note  Referring Provider: Laurann Montana, MD Primary Provider: Laurann Montana, MD Date of Office Visit: 03/18/2018  Subjective:   Brenda Daniel (DOB: 10/21/1955) is a 61 y.o. female who [facility] 03[facility]2018 in re-evaluation of the following:  HPI: Brenda Daniel returns to this clinic in reevaluation of her asthma and allergic rhinitis and reflux and strong family history of heart disease.  I have not seen her in this clinic since 07 January 2018 at which point in time we assigned the plan of therapy directed against respiratory tract inflammation and reflux induced respiratory disease to address active respiratory tract symptoms.  She did great with that therapy and basically resolved her cough and all respiratory tract symptoms.  She has slowly been tapering off her medications.  She did discontinue Symbicort because it gave rise to irritation in her mouth and tongue.  She has slowly tapered off Nexium and has been off this agent for about 3 to 4 weeks.  She does continue to use Flovent every day.  In fact, she did attempt to stop her Flovent but developed some issues with shortness of breath which resolved upon reinitiation of this medication.  She continues on ranitidine and Flonase and montelukast.  Because of her strong family history of heart disease she is scheduled to see a cardiologist.  She did have the flu vaccine completed this year.  Allergies as of 03/18/2018      Reactions   Advil [ibuprofen] Other (See Comments)   dizzy   Niacin And Related Other (See Comments)   dizzy   Red Yeast Rice [cholestin] Other (See Comments)   dizzy      Medication List      albuterol 108 (90 Base) MCG/ACT inhaler Commonly known as:  PROVENTIL HFA;VENTOLIN HFA 2 puffs every 4-6 hours as needed for cough or wheezing.   albuterol (2.5 MG/3ML) 0.083% nebulizer solution Commonly known as:  PROVENTIL Take 3 mLs (2.5 mg total) by nebulization  every 6 (six) hours as needed for wheezing or shortness of breath.   benzonatate 100 MG capsule Commonly known as:  TESSALON Take 1 capsule (100 mg total) by mouth 3 (three) times daily as needed for cough (for disruptive daytime cough.).   clobetasol cream 0.05 % Commonly known as:  TEMOVATE Apply 1 application topically at bedtime as needed (itching on left nipple).   cyclobenzaprine 5 MG tablet Commonly known as:  FLEXERIL 1 PO TID prn   diclofenac sodium 1 % Gel Commonly known as:  VOLTAREN Apply 2 g topically 4 (four) times daily.   esomeprazole 40 MG capsule Commonly known as:  NEXIUM Take 40 mg by mouth daily as needed (acid reflux/cough).   fluticasone 110 MCG/ACT inhaler Commonly known as:  FLOVENT HFA Inhale 2 puffs into the lungs 2 (two) times daily as needed (cough).   fluticasone 50 MCG/ACT nasal spray Commonly known as:  FLONASE Place 1 spray into both nostrils daily.   fluticasone furoate-vilanterol 200-25 MCG/INH Aepb Commonly known as:  BREO ELLIPTA Inhale 1 puff into the lungs daily.   ipratropium-albuterol 0.5-2.5 (3) MG/3ML Soln Commonly known as:  DUONEB Take 3 mLs by nebulization every 4 (four) hours as needed.   loratadine 10 MG tablet Commonly known as:  CLARITIN Take 10 mg by mouth at bedtime as needed (seasonal allergies).   losartan 100 MG tablet Commonly known as:  COZAAR Take 100 mg by mouth daily.   mometasone-formoterol 200-5 MCG/ACT  Aero Commonly known as:  DULERA Inhale 2 puffs into the lungs 2 (two) times daily.   montelukast 10 MG tablet Commonly known as:  SINGULAIR Take one tablet once daily   POTASSIUM PO Take 1 tablet by mouth at bedtime.   ranitidine 300 MG tablet Commonly known as:  ZANTAC TAKE 1 TABLET EVERY EVENING DURING FLARE AS DIRECTED   traZODone 50 MG tablet Commonly known as:  DESYREL Take 25 mg by mouth at bedtime.   triamterene-hydrochlorothiazide 75-50 MG tablet Commonly known as:  MAXZIDE Take 1  tablet by mouth daily.   valACYclovir 500 MG tablet Commonly known as:  VALTREX Take 500 mg by mouth See admin instructions. Take one tablet (500 mg) by mouth daily at bedtime, take four tablets (2000 mg) once as one dose as needed at onset of fever blister   VITAMIN D PO Take 1 tablet by mouth See admin instructions. Take one tablet by mouth daily on Monday thru Friday (skip Saturday and Sunday)       Past Medical History:  Diagnosis Date  . Allergic rhinitis    per Dr. Lucie Leather  . Asthma    per Dr. Lucie Leather  . DJD (degenerative joint disease)   . GERD (gastroesophageal reflux disease)   . Hyperlipidemia   . Hypertension     Past Surgical History:  Procedure Laterality Date  . APPENDECTOMY    . BRAVO PH STUDY N/A 03/16/2015   Procedure: BRAVO PH STUDY;  Surgeon: Charlott Rakes, MD;  Location: [facility];  Service: Endoscopy;  Laterality: N/A;  . CESAREAN SECTION    . ESOPHAGOGASTRODUODENOSCOPY N/A 03/16/2015   Procedure: ESOPHAGOGASTRODUODENOSCOPY (EGD);  Surgeon: Charlott Rakes, MD;  Location: [facility];  Service: Endoscopy;  Laterality: N/A;  . MYOMECTOMY    . TEMPOROMANDIBULAR JOINT SURGERY Right   . TONSILLECTOMY      Review of systems negative except as noted in HPI / PMHx or noted below:  Review of Systems  Constitutional: Negative.   HENT: Negative.   Eyes: Negative.   Respiratory: Negative.   Cardiovascular: Negative.   Gastrointestinal: Negative.   Genitourinary: Negative.   Musculoskeletal: Negative.   Skin: Negative.   Neurological: Negative.   Endo/Heme/Allergies: Negative.   Psychiatric/Behavioral: Negative.      Objective:   Vitals:   03/18/18 1752  BP: 126/74  Pulse: 80  Resp: 16  SpO2: 97%   Height: 5\' 2"  (157.5 cm)  Weight: 183 lb 6.4 oz (83.2 kg)   Physical Exam  HENT:  Head: Normocephalic.  Right Ear: Tympanic membrane, external ear and ear canal normal.  Left Ear: Tympanic membrane, external ear and ear canal normal.    Nose: Nose normal. No mucosal edema or rhinorrhea.  Mouth/Throat: Uvula is midline, oropharynx is clear and moist and mucous membranes are normal. No oropharyngeal exudate.  Eyes: Conjunctivae are normal.  Neck: Trachea normal. No tracheal tenderness present. No tracheal deviation present. No thyromegaly present.  Cardiovascular: Normal rate, regular rhythm, S1 normal, S2 normal and normal heart sounds.  No murmur heard. Pulmonary/Chest: Breath sounds normal. No stridor. No respiratory distress. She has no wheezes. She has no rales.  Musculoskeletal: She exhibits no edema.  Lymphadenopathy:       Head (right side): No tonsillar adenopathy present.       Head (left side): No tonsillar adenopathy present.    She has no cervical adenopathy.  Neurological: She is alert.  Skin: No rash noted. She is not diaphoretic. No erythema. Nails show no clubbing.  Diagnostics:    Spirometry was performed and demonstrated an FEV1 of 2.20 at 86 % of predicted.  The patient had an Asthma Control Test with the following results: ACT Total Score: 24.    Assessment and Plan:   1. Asthma, severe persistent, well-controlled   2. Perennial allergic rhinitis   3. LPRD (laryngopharyngeal reflux disease)   4. Family history of heart disease     1. Continue to Treat reflux:   A. Ranitidine 300 mg or famotidine 40 mg 1 time per day   B.  Can add Nexium 40 mg twice a day if needed  2. Continue to treat inflammation:   A. Flonase 1-2 sprays each nostril one time per day  B. Montelukast 10mg  one time per day  C. Flovent 110 - 2 inhalations 1-2 times per day with spacer  3. If needed:   A. Antihistamine   B. Proair HFA - 2 inhalations every 4-6 hours  C. Mucinex DM - 2 tablets two times per day  4. Return to clinic in 6 months or earlier if problem  Tomesha appears to be doing relatively well as she has slowly tapered down her medications and we will continue to have her use anti-inflammatory  agents for her respiratory tract as noted above and therapy directed against reflux.  She will be visiting with a cardiologist concerning her strong family history of heart disease.  I will see her back in this clinic in 6 months or earlier if there is a problem.  Laurette Schimke, MD Allergy / Immunology [facility]

## 2018-03-19 ENCOUNTER — Encounter: Payer: Self-pay | Admitting: Allergy and Immunology

## 2018-03-25 ENCOUNTER — Encounter: Payer: Self-pay | Admitting: Internal Medicine

## 2018-03-25 ENCOUNTER — Ambulatory Visit (INDEPENDENT_AMBULATORY_CARE_PROVIDER_SITE_OTHER): Payer: No Typology Code available for payment source | Admitting: Internal Medicine

## 2018-03-25 VITALS — BP 120/72 | HR 81 | Ht 62.0 in | Wt 186.4 lb

## 2018-03-25 DIAGNOSIS — K219 Gastro-esophageal reflux disease without esophagitis: Secondary | ICD-10-CM

## 2018-03-25 DIAGNOSIS — J45909 Unspecified asthma, uncomplicated: Secondary | ICD-10-CM | POA: Diagnosis not present

## 2018-03-25 DIAGNOSIS — R0789 Other chest pain: Secondary | ICD-10-CM

## 2018-03-25 DIAGNOSIS — R7303 Prediabetes: Secondary | ICD-10-CM

## 2018-03-25 DIAGNOSIS — I10 Essential (primary) hypertension: Secondary | ICD-10-CM | POA: Diagnosis not present

## 2018-03-25 DIAGNOSIS — E785 Hyperlipidemia, unspecified: Secondary | ICD-10-CM

## 2018-03-25 MED ORDER — METOPROLOL TARTRATE 100 MG PO TABS: ORAL_TABLET | ORAL | 0 refills | Status: DC

## 2018-03-25 NOTE — Patient Instructions (Signed)
Medication Instructions:  Your physician recommends that you continue on your current medications as directed. Please refer to the Current Medication list given to you today.  A one time dose of Metoprolol Tartrate 100mg  tablet has been sent to your pharmacy. Please take this medication 2 hours prior to your Cardiac CT  If you need a refill on your cardiac medications before your next appointment, please call your pharmacy.   Lab work: Your physician recommends that you return for lab work  A couple of days prior to your CT. Please bring the lab slip given to you at your office visit with you that day   If you have labs (blood work) drawn today and your tests are completely normal, you will receive your results only by: Marland Kitchen MyChart Message (if you have MyChart) OR . A paper copy in the mail If you have any lab test that is abnormal or we need to change your treatment, we will call you to review the results.  Testing/Procedures: Your physician has requested that you have cardiac CT. Cardiac computed tomography (CT) is a painless test that uses an x-ray machine to take clear, detailed pictures of your heart. For further information please visit [email or web address] Please follow instruction sheet as given.     Follow-Up: At [facility], you and your health needs are our priority.  As part of our continuing mission to provide you with exceptional heart care, we have created designated Provider Care Teams.  These Care Teams include your primary Cardiologist (physician) and Advanced Practice Providers (APPs -  Physician Assistants and Nurse Practitioners) who all work together to provide you with the care you need, when you need it. Your physician recommends that you schedule a follow-up appointment pending test results   Any Other Special Instructions Will Be Listed Below (If Applicable).  Please arrive at the [facility] main entrance of [facility] at TBD AM (30-45 minutes prior  to test start time)  [facility] [address] [place], [place] [address] [phone]  Proceed to the [facility] Radiology Department (First Floor).  Please follow these instructions carefully (unless otherwise directed):  Hold all erectile dysfunction medications at least 48 hours prior to test.  On the Night Before the Test: . Be sure to Drink plenty of water. . Do not consume any caffeinated/decaffeinated beverages or chocolate 12 hours prior to your test. . Do not take any antihistamines 12 hours prior to your test. . HOLD your Maxzide the morning of the test  On the Day of the Test: . Drink plenty of water. Do not drink any water within one hour of the test. . Do not eat any food 4 hours prior to the test. . You may take your regular medications prior to the test.  . Take metoprolol (Lopressor) two hours prior to test.    After the Test: . Drink plenty of water. . After receiving IV contrast, you may experience a mild flushed feeling. This is normal. . On occasion, you may experience a mild rash up to 24 hours after the test. This is not dangerous. If this occurs, you can take Benadryl 25 mg and increase your fluid intake. . If you experience trouble breathing, this can be serious. If it is severe call 911 IMMEDIATELY. If it is mild, please call our office. . If you take any of these medications: Glipizide/Metformin, Avandament, Glucavance, please do not take 48 hours after completing test.

## 2018-03-25 NOTE — Consult Note (Signed)
Cardiology Office Note:    Date:  03/26/2018   ID:  Brenda Daniel, DOB February 17, 1956, MRN [id number]  PCP:  Laurann Montana, MD  Cardiologist:  No primary care provider on file.  Electrophysiologist:  None   Referring MD: Laurann Montana, MD   Chest tightness.  History of Present Illness:    Brenda Daniel is a 62 y.o. female with a hx of hypertension, hyperlipidemia, prediabetes, GERD, who presents today for evaluation of chest tightness.  She follows with asthma and allergy center at [facility] seeing Dr. Laurette Schimke.  She tells me she has been diagnosed with cough variant asthma as well as exercise-induced asthma and also has been treated for GERD.  She will occasionally have a sensation of chest tightness that lasts seconds to minutes.  There are no clear-cut alleviating or exacerbating features.  She exercises by doing yoga and breathing activities, and does not feel that she gets chest discomfort during yoga but does tell me that practice is not particularly aerobically strenuous.  She had an asthma exacerbation in July 2019.  After her medications were adjusted she feels that things have actually been better.  She denies significant exertional chest pain.  She has not attempted to use her rescue inhaler at times of chest tightness and will do so after our visit.  She has occasional sense of palpitations primarily during deep breathing exercises during her yoga class.  She endorses a sense of pounding, which often causes her to not perform the breathing exercise.  She denies dyspnea at rest, PND, orthopnea, or leg swelling. Denies syncope or presyncope. Denies dizziness or lightheadedness.  Endorses occasional snoring and has not be evaluated for sleep apnea.  Epworth Sleepiness Scale score is 4.   Past Medical History:  Diagnosis Date  . Allergic rhinitis    per Dr. Lucie Leather  . Asthma    per Dr. Lucie Leather  . DJD (degenerative joint disease)   . GERD (gastroesophageal reflux  disease)   . Hyperlipidemia   . Hypertension     Past Surgical History:  Procedure Laterality Date  . APPENDECTOMY    . BRAVO PH STUDY N/A 03/16/2015   Procedure: BRAVO PH STUDY;  Surgeon: Charlott Rakes, MD;  Location: [facility];  Service: Endoscopy;  Laterality: N/A;  . CESAREAN SECTION    . ESOPHAGOGASTRODUODENOSCOPY N/A 03/16/2015   Procedure: ESOPHAGOGASTRODUODENOSCOPY (EGD);  Surgeon: Charlott Rakes, MD;  Location: [facility];  Service: Endoscopy;  Laterality: N/A;  . MYOMECTOMY    . TEMPOROMANDIBULAR JOINT SURGERY Right   . TONSILLECTOMY      Current Medications: Current Meds  Medication Sig  . albuterol (PROAIR HFA) 108 (90 Base) MCG/ACT inhaler 2 puffs every 4-6 hours as needed for cough or wheezing.  Marland Kitchen albuterol (PROVENTIL) (2.5 MG/3ML) 0.083% nebulizer solution Take 3 mLs (2.5 mg total) by nebulization every 6 (six) hours as needed for wheezing or shortness of breath.  . benzonatate (TESSALON PERLES) 100 MG capsule Take 1 capsule (100 mg total) by mouth 3 (three) times daily as needed for cough (for disruptive daytime cough.).  . Cholecalciferol (VITAMIN D PO) Take 1 tablet by mouth See admin instructions. Take one tablet by mouth daily on Monday thru Friday (skip Saturday and Sunday)  . clobetasol cream (TEMOVATE) 0.05 % Apply 1 application topically at bedtime as needed (itching on left nipple).  Marland Kitchen diclofenac sodium (VOLTAREN) 1 % GEL Apply 2 g topically 4 (four) times daily. (Patient taking differently: Apply 1 application topically 2 (two)  times daily as needed (arthritis pain). )  . fluticasone (FLONASE) 50 MCG/ACT nasal spray Place 1 spray into both nostrils daily.  . fluticasone (FLOVENT HFA) 110 MCG/ACT inhaler Inhale 2 puffs into the lungs 2 (two) times daily as needed (cough).  Marland Kitchen ipratropium-albuterol (DUONEB) 0.5-2.5 (3) MG/3ML SOLN Take 3 mLs by nebulization every 4 (four) hours as needed.  . loratadine (CLARITIN) 10 MG tablet Take 10 mg by mouth at  bedtime as needed (seasonal allergies).  . losartan (COZAAR) 100 MG tablet Take 100 mg by mouth daily.  . montelukast (SINGULAIR) 10 MG tablet Take one tablet once daily (Patient taking differently: Take 10 mg by mouth at bedtime as needed (cough). )  . POTASSIUM PO Take 1 tablet by mouth at bedtime.  . ranitidine (ZANTAC) 300 MG tablet TAKE 1 TABLET EVERY EVENING DURING FLARE AS DIRECTED (Patient taking differently: TAKE 1 TABLET EVERY EVENING DURING FLARE AS DIRECTED (AS NEEDED FOR COUGH))  . traZODone (DESYREL) 50 MG tablet Take 25 mg by mouth at bedtime.  . triamterene-hydrochlorothiazide (MAXZIDE) 75-50 MG tablet Take 1 tablet by mouth daily.  . valACYclovir (VALTREX) 500 MG tablet Take 500 mg by mouth See admin instructions. Take one tablet (500 mg) by mouth daily at bedtime, take four tablets (2000 mg) once as one dose as needed at onset of fever blister     Allergies:   Advil [ibuprofen]; Niacin and related; and Red yeast rice [cholestin]   Social History   Socioeconomic History  . Marital status: Married    Spouse name: Not on file  . Number of children: 2  . Years of education: Not on file  . Highest education level: Not on file  Occupational History  . Occupation: judge  Social Needs  . Financial resource strain: Not on file  . Food insecurity:    Worry: Not on file    Inability: Not on file  . Transportation needs:    Medical: Not on file    Non-medical: Not on file  Tobacco Use  . Smoking status: Never Smoker  . Smokeless tobacco: Never Used  Substance and Sexual Activity  . Alcohol use: Yes    Alcohol/week: 0.0 standard drinks    Comment: glass a wine a day  . Drug use: No  . Sexual activity: Yes  Lifestyle  . Physical activity:    Days per week: Not on file    Minutes per session: Not on file  . Stress: Not on file  Relationships  . Social connections:    Talks on phone: Not on file    Gets together: Not on file    Attends religious service: Not on file      Active member of club or organization: Not on file    Attends meetings of clubs or organizations: Not on file    Relationship status: Not on file  Other Topics Concern  . Not on file  Social History Narrative  . Not on file     Family History: The patient's family history includes Allergies in her sister; Asthma in her sister; Eczema in her sister; Heart attack in her brother, father, and paternal grandfather; Heart disease in her father; Hypertension in her brother, sister, sister, and unknown relative; Stroke in her father.  ROS:   Please see the history of present illness.    All other systems reviewed and are negative.  EKGs/Labs/Other Studies Reviewed:    The following studies were reviewed today:  EKG:  EKG is ordered  today.  The ekg ordered today demonstrates normal sinus rhythm with a nonspecific T wave abnormality.  Recent Labs: 11/30/2017: BUN 13; Creatinine, Ser 0.87; Hemoglobin 14.0; Platelets 265; Potassium 3.5; Sodium 140  Recent Lipid Panel No results found for: CHOL, TRIG, HDL, CHOLHDL, VLDL, LDLCALC, LDLDIRECT  Physical Exam:    VS:  BP 120/72   Pulse 81   Ht 5\' 2"  (1.575 m)   Wt 186 lb 6.4 oz (84.6 kg)   BMI 34.09 kg/m     Wt Readings from Last 3 Encounters:  03/25/18 186 lb 6.4 oz (84.6 kg)  03/18/18 183 lb 6.4 oz (83.2 kg)  06/24/17 200 lb (90.7 kg)     Constitutional: No acute distress Eyes: pupils equally round and reactive to light, sclera non-icteric, normal conjunctiva and lids ENMT: normal dentition, moist mucous membranes Cardiovascular: regular rhythm, normal rate, no murmurs. S1 and S2 normal. Radial pulses normal bilaterally. No jugular venous distention.  Respiratory: clear to auscultation bilaterally GI : normal bowel sounds, soft and nontender. No distention.   MSK: extremities warm, well perfused. No edema.  NEURO: grossly nonfocal exam, moves all extremities. PSYCH: alert and oriented x 3, normal mood and affect.   ASSESSMENT:     1. Chest tightness   2. Asthma   3. Essential hypertension   4. Hyperlipidemia, unspecified hyperlipidemia type   5. Prediabetes   6. Gastroesophageal reflux disease, esophagitis presence not specified    PLAN:    1. Chest tightness   2. Asthma   Description of her symptoms seems most consistent with asthma symptoms, however she does have a family history of coronary artery disease, with her brother having an MI at age 11 (he was a smoker), father having an MI at age 17 and strokes in his 43s, and paternal grandfather having an MI at age 30.  I have offered to the patient to monitor her symptoms and attempt to use her albuterol inhaler.  However, we participated in shared decision making and have determined that an assessment of coronary artery calcium and plaque burden with functional testing with FFR would be a reasonable first test.  We will pursue CT coronary angiography with FFR to determine if her chest tightness is related to flow-limiting coronary lesions.  If her exam reveals nonobstructive coronary disease, her symptoms are most likely secondary to asthma.  We did discuss stress testing, however if she is unable to perform peak exercise it would be challenging to give her regadenoson and her asthma.   3. Essential hypertension   4. Hyperlipidemia, unspecified hyperlipidemia type   5. Prediabetes   6. Gastroesophageal reflux disease, esophagitis presence not specified    I will see her back as needed pending results of her testing.  Medication Adjustments/Labs and Tests Ordered: Current medicines are reviewed at length with the patient today.  Concerns regarding medicines are outlined above.  Orders Placed This Encounter  Procedures  . CT CORONARY MORPH W/CTA COR W/SCORE W/CA W/CM &/OR WO/CM  . CT CORONARY FRACTIONAL FLOW RESERVE DATA PREP  . CT CORONARY FRACTIONAL FLOW RESERVE FLUID ANALYSIS  . Basic metabolic panel  . EKG 12-Lead   Meds ordered this encounter   Medications  . metoprolol tartrate (LOPRESSOR) 100 MG tablet    Sig: Take 1 tablet (100mg ) 2 hours prior to your Cardiac CT    Dispense:  1 tablet    Refill:  0    Patient Instructions  Medication Instructions:  Your physician recommends that you continue on  your current medications as directed. Please refer to the Current Medication list given to you today.  If you need a refill on your cardiac medications before your next appointment, please call your pharmacy.   Lab work: Your physician recommends that you return for lab work  A couple of days prior to your CT. Please bring the lab slip given to you at your office visit with you that day   If you have labs (blood work) drawn today and your tests are completely normal, you will receive your results only by: Marland Kitchen MyChart Message (if you have MyChart) OR . A paper copy in the mail If you have any lab test that is abnormal or we need to change your treatment, we will call you to review the results.  Testing/Procedures: Your physician has requested that you have cardiac CT. Cardiac computed tomography (CT) is a painless test that uses an x-ray machine to take clear, detailed pictures of your heart. For further information please visit [email or web address] Please follow instruction sheet as given.     Follow-Up: At [facility], you and your health needs are our priority.  As part of our continuing mission to provide you with exceptional heart care, we have created designated Provider Care Teams.  These Care Teams include your primary Cardiologist (physician) and Advanced Practice Providers (APPs -  Physician Assistants and Nurse Practitioners) who all work together to provide you with the care you need, when you need it. Your physician recommends that you schedule a follow-up appointment pending test results   Any Other Special Instructions Will Be Listed Below (If Applicable).  Please arrive at the [facility] main entrance of  [facility] at TBD AM (30-45 minutes prior to test start time)  [facility] [address] [place], [place] [address] [phone]  Proceed to the [facility] Radiology Department (First Floor).  Please follow these instructions carefully (unless otherwise directed):  Hold all erectile dysfunction medications at least 48 hours prior to test.  On the Night Before the Test: . Be sure to Drink plenty of water. . Do not consume any caffeinated/decaffeinated beverages or chocolate 12 hours prior to your test. . Do not take any antihistamines 12 hours prior to your test. . HOLD your Maxzide the morning of the test  On the Day of the Test: . Drink plenty of water. Do not drink any water within one hour of the test. . Do not eat any food 4 hours prior to the test. . You may take your regular medications prior to the test.      After the Test: . Drink plenty of water. . After receiving IV contrast, you may experience a mild flushed feeling. This is normal. . On occasion, you may experience a mild rash up to 24 hours after the test. This is not dangerous. If this occurs, you can take Benadryl 25 mg and increase your fluid intake. . If you experience trouble breathing, this can be serious. If it is severe call 911 IMMEDIATELY. If it is mild, please call our office. . If you take any of these medications: Glipizide/Metformin, Avandament, Glucavance, please do not take 48 hours after completing test.       Signed, Parke Poisson, MD  03/26/2018 11:43 AM    [facility]

## 2018-04-07 ENCOUNTER — Telehealth: Payer: Self-pay

## 2018-04-07 NOTE — Telephone Encounter (Signed)
Per Dr.Acharya disregard the prior message, it will be ok for the pt to take Metoprolol 100mg  2 hours prior to her Cor CT

## 2018-04-07 NOTE — Telephone Encounter (Signed)
Per Dr.Acharya the pt should not take Metoprolol prior to taking her upcoming Cor CTA. called pt pharmacy to cancel Rx, pt picked up the script on 11/14. Tried to call all tel numbers listed for pt.  No voicemail available on all 3 lines.

## 2018-04-22 ENCOUNTER — Ambulatory Visit: Payer: No Typology Code available for payment source | Admitting: Allergy and Immunology

## 2018-04-24 ENCOUNTER — Telehealth: Payer: Self-pay

## 2018-04-24 NOTE — Telephone Encounter (Signed)
Called pt after receiving a message from the scheduler that the pt has a question about her medication prior to her scheduled Cor CTA. Adv pt that she should HOLD her Maxzide the morning of the test. Reminded pt to take her 1 time dose of Metprolol 100mg  2 hours prior to the test. Reminded pt that she will need to come to our office a couple of days prior to her test for labwork.  Pt sts that she has no further questions at this time and verbalized understanding to the instructions given.

## 2018-04-28 LAB — BASIC METABOLIC PANEL
BUN/Creatinine Ratio: 13 (ref 12–28)
BUN: 12 mg/dL (ref 8–27)
CO2: 24 mmol/L (ref 20–29)
Calcium: 10 mg/dL (ref 8.7–10.3)
Chloride: 96 mmol/L (ref 96–106)
Creatinine, Ser: 0.93 mg/dL (ref 0.57–1.00)
GFR calc Af Amer: 77 mL/min/{1.73_m2} (ref >59)
GFR calc non Af Amer: 67 mL/min/{1.73_m2} (ref >59)
GLUCOSE: 89 mg/dL (ref 65–99)
POTASSIUM: 4 mmol/L (ref 3.5–5.2)
SODIUM: 138 mmol/L (ref 134–144)

## 2018-05-06 ENCOUNTER — Telehealth (HOSPITAL_COMMUNITY): Payer: Self-pay | Admitting: Emergency Medicine

## 2018-05-06 ENCOUNTER — Encounter (HOSPITAL_COMMUNITY): Payer: Self-pay | Admitting: Emergency Medicine

## 2018-05-06 NOTE — Telephone Encounter (Signed)
Reaching out to patient to offer assistance, answer question regarding upcoming cardiac imaging study; patient not available but spoke to husband Bill who was instructed on where to park/check in, informed about metoprolol administration 2 hr prior to scan, no caffeine, NPO 4 hr prior to scan, and water 1 hr prior to scan; Bill verbalized understanding; offered name and call back number should the patient have further questions  Rockwell AlexandriaSara Vang Kraeger RN Navigator Cardiac Imaging  [phone]

## 2018-05-08 ENCOUNTER — Ambulatory Visit (HOSPITAL_COMMUNITY): Admission: RE | Admit: 2018-05-08 | Payer: No Typology Code available for payment source | Source: Ambulatory Visit

## 2018-05-08 ENCOUNTER — Ambulatory Visit (HOSPITAL_COMMUNITY)
Admission: RE | Admit: 2018-05-08 | Discharge: 2018-05-08 | Disposition: A | Discharge status: Home / Self Care | Payer: No Typology Code available for payment source | Source: Ambulatory Visit | Attending: Internal Medicine | Admitting: Internal Medicine

## 2018-05-08 DIAGNOSIS — R0789 Other chest pain: Secondary | ICD-10-CM | POA: Diagnosis not present

## 2018-05-08 MED ORDER — NITROGLYCERIN 0.4 MG SL SUBL
0.8000 mg | SUBLINGUAL_TABLET | Freq: Once | SUBLINGUAL | Status: AC
  Administered 2018-05-08: 0.8 mg via SUBLINGUAL
  Filled 2018-05-08: qty 25

## 2018-05-08 MED ORDER — IOPAMIDOL (ISOVUE-370) INJECTION 76%
80.0000 mL | Freq: Once | INTRAVENOUS | Status: AC | PRN
  Administered 2018-05-08: 80 mL via INTRAVENOUS

## 2018-05-08 MED ORDER — NITROGLYCERIN 0.4 MG SL SUBL
SUBLINGUAL_TABLET | SUBLINGUAL | Status: AC
  Administered 2018-05-08: 0.8 mg via SUBLINGUAL
  Filled 2018-05-08: qty 2

## 2018-05-12 ENCOUNTER — Telehealth: Payer: Self-pay

## 2018-05-12 DIAGNOSIS — K769 Liver disease, unspecified: Secondary | ICD-10-CM

## 2018-05-12 NOTE — Telephone Encounter (Signed)
Called to give pt Cor CT results and Dr.Acharya's recommendation. Unable to lmom pt voicemail is full. Results message sent via mychart. Pt is to contact the office if any questions. No significant CAD.  There is a recommendation for liver ultrasound in the radiologist's report due to low density liver lesions. We have placed the order for a liver ultrasound to [facility]. Please call at your convenience to schedule [phone]. Please let us know if you have any questions.

## 2018-05-12 NOTE — Telephone Encounter (Signed)
-----   Message from Parke PoissonGayatri A Acharya, MD sent at 05/12/2018  8:58 AM EST ----- No significant CAD.  There is a recommendation for liver ultrasound in the radiologist's report due to low density liver lesions. Please arrange for a liver U/s (or right upper quadrant ultrasound).

## 2018-05-19 ENCOUNTER — Telehealth: Payer: Self-pay

## 2018-05-19 DIAGNOSIS — K769 Liver disease, unspecified: Secondary | ICD-10-CM

## 2018-05-19 NOTE — Telephone Encounter (Signed)
-----   Message from Parke PoissonGayatri A Acharya, MD sent at 05/19/2018 11:29 AM EST ----- Can you fix this order for me? Just a regular ultrasound of the right upper quadrant.  Thanks. GA ----- Message ----- From: Malka SoLee, Bryana Sent: 05/16/2018  11:08 AM EST To: Parke PoissonGayatri A Acharya, MD  Good Morning Dr. Jacques NavyAcharya, I am sending you this message in regards to the patient referenced above. There is an Epic order in her chart for a Liver Doppler for a hepatic lesion, and per our US supervisor, this is not the exam we will need to order. A doppler only measure the blood flow within that area and she believes the best exam to order will be an us abd ruq. Please let me know if you have nay questions or concerns about this.  Thank you, B.L.

## 2018-05-19 NOTE — Telephone Encounter (Signed)
Per message received from [facility] order update for US limited RUQ dx hepatic lesion, prior order cancelled.   2nd attempt to reach pt with Cor CT results and Dr.Acharya's recommendation, unable to lmom, pt voicemail is full. Letter mailed to pt to contact the office.

## 2018-05-23 ENCOUNTER — Ambulatory Visit
Admission: RE | Admit: 2018-05-23 | Discharge: 2018-05-23 | Disposition: A | Discharge status: Home / Self Care | Payer: No Typology Code available for payment source | Source: Ambulatory Visit | Attending: Internal Medicine | Admitting: Internal Medicine

## 2018-05-23 ENCOUNTER — Ambulatory Visit
Admission: RE | Admit: 2018-05-23 | Discharge: 2018-05-23 | Disposition: A | Discharge status: Home / Self Care | Payer: No Typology Code available for payment source | Source: Ambulatory Visit | Attending: Family Medicine | Admitting: Family Medicine

## 2018-05-23 ENCOUNTER — Encounter: Payer: Self-pay | Admitting: Family Medicine

## 2018-05-23 ENCOUNTER — Ambulatory Visit (INDEPENDENT_AMBULATORY_CARE_PROVIDER_SITE_OTHER): Payer: No Typology Code available for payment source | Admitting: Family Medicine

## 2018-05-23 VITALS — BP 128/74 | HR 121 | Temp 101.3°F | Resp 18

## 2018-05-23 DIAGNOSIS — J3089 Other allergic rhinitis: Secondary | ICD-10-CM | POA: Diagnosis not present

## 2018-05-23 DIAGNOSIS — R059 Cough, unspecified: Secondary | ICD-10-CM

## 2018-05-23 DIAGNOSIS — K219 Gastro-esophageal reflux disease without esophagitis: Secondary | ICD-10-CM

## 2018-05-23 DIAGNOSIS — K769 Liver disease, unspecified: Secondary | ICD-10-CM

## 2018-05-23 DIAGNOSIS — J4541 Moderate persistent asthma with (acute) exacerbation: Secondary | ICD-10-CM | POA: Diagnosis not present

## 2018-05-23 DIAGNOSIS — R05 Cough: Secondary | ICD-10-CM

## 2018-05-23 MED ORDER — LEVALBUTEROL HCL 1.25 MG/3ML IN NEBU: 1.2500 mg | INHALATION_SOLUTION | RESPIRATORY_TRACT | 0 refills | Status: DC | PRN

## 2018-05-23 NOTE — Patient Instructions (Addendum)
Cough Chest xray to help [place] determine the cause of your cough. I will call you as soon as results become available Increase fluids as tolerated Mucinex [phone] mg twice a day Prednisone 10 mg tablets. Take 2 tablets twice a day for 3 days, then take 2 tablets once a day for 1 day, then take 1 tablet once on the 5th day, then stop.  Asthma Continue Flovent 2 puffs twice a day with a spacer to prevent cough and wheeze. If your symptoms worsen increase Flovent to 3 puffs three times a day with a spacer until cough and wheeze free. At that time, decrease Flovent back to 2 puffs twice a day Stop albuterol via nebulizer as this increased your heart rate. Begin Xopenex via nebulizer every 4-6 hours as needed for cough or wheeze  Allergic rhinitis Continue Flonase 1-2 sprays in each nostril once a day as needed for stuffy nose Claritin 10 mg once a day as needed for a runny nose  Reflux Continue esomeprazole 40 mg once a day to decrease reflux Continue lifestyle changes  Call the clinic if this treatment plan is not working well for you  Follow up at your scheduled appointment with Dr. Lucie Leather on June 10, 2018

## 2018-05-23 NOTE — Progress Notes (Addendum)
[address] [place] [address] Dept: [phone]  FOLLOW UP NOTE  Patient ID: Brenda Daniel, female    DOB: Sep 18, 1955  Age: 63 y.o. MRN: [id number] Date of Office Visit: 05/23/2018  Assessment  Chief Complaint: No chief complaint on file.  HPI Brenda Daniel is a 63 year old female who presents to the clinic for a sick visit. She reports that she began to experience a non productive cough accompanied by chills on Tuesday which was closely followed by nasal congestion. She reports shortness of breath and the cough are worsened with activity. She is currently using Flovent 110-2 puffs twice a day with a spacer, taking montelukast 10 mg once a day, and infrequently using albuterol. She reports using albuterol via nebulizer yesterday which did not improve her symptoms and also gave her heart palpitations. Allergic rhinitis is reported as moderately well controlled with Claritin once a day and Flonase as needed. She reports reflux has been well controlled and she egan taking esomeprazole 9 days ago. She has tried [employer], Nyquil, and Tylenol products with little relief from her current symptoms. She denies sick contacts and reports chills that began on Tuesday. Her current medications are listed in the chart.    Drug Allergies:  Allergies  Allergen Reactions  . Advil [Ibuprofen] Other (See Comments)    dizzy  . Niacin And Related Other (See Comments)    dizzy  . Red Yeast Rice [Cholestin] Other (See Comments)    dizzy    Physical Exam: BP 128/74 (BP Location: Right Arm, Patient Position: Sitting, Cuff Size: Normal)   Pulse (!) 121   Temp (!) 101.3 F (38.5 C) (Oral)   Resp 18   SpO2 93%    Physical Exam Vitals signs reviewed.  Constitutional:      Appearance: Normal appearance.  HENT:     Head: Normocephalic and atraumatic.     Right Ear: Tympanic membrane normal.     Left Ear: Tympanic membrane normal.     Nose:     Comments: Bilateral nares  slightly erythematous with no nasal drainage noted. Pharynx normal. Ears normal. Eyes normal.    Mouth/Throat:     Pharynx: Oropharynx is clear.  Eyes:     Conjunctiva/sclera: Conjunctivae normal.  Neck:     Musculoskeletal: Normal range of motion and neck supple.  Cardiovascular:     Rate and Rhythm: Normal rate and regular rhythm.     Heart sounds: Normal heart sounds. No murmur.     Comments: No murmur noted Pulmonary:     Effort: Pulmonary effort is normal.     Comments: No wheezing noted. Rhonchi that moves with cough noted.  Musculoskeletal: Normal range of motion.  Skin:    General: Skin is warm and dry.  Neurological:     Mental Status: She is alert and oriented to person, place, and time.  Psychiatric:        Mood and Affect: Mood normal.        Behavior: Behavior normal.        Thought Content: Thought content normal.        Judgment: Judgment normal.     Diagnostics: Deferred due to fever and cough  Assessment and Plan: 1. Cough   2. Moderate persistent asthma with acute exacerbation   3. LPRD (laryngopharyngeal reflux disease)   4. Perennial allergic rhinitis     Meds ordered this encounter  Medications  . levalbuterol (XOPENEX) 1.25 MG/3ML nebulizer solution  Sig: Take 1.25 mg by nebulization every 4 (four) hours as needed for up to 30 doses for wheezing or shortness of breath (cough, wheeze, or shortness of breath).    Dispense:  3 mL    Refill:  0    Patient Instructions  Cough Chest xray to help us determine the cause of your cough. I will call you as soon as results become available Increase fluids as tolerated Mucinex [phone] mg twice a day Prednisone 10 mg tablets. Take 2 tablets twice a day for 3 days, then take 2 tablets once a day for 1 day, then take 1 tablet once on the 5th day, then stop.  Asthma Continue Flovent 2 puffs twice a day with a spacer to prevent cough and wheeze. If your symptoms worsen increase Flovent to 3 puffs three  times a day with a spacer until cough and wheeze free. At that time, decrease Flovent back to 2 puffs twice a day Stop albuterol via nebulizer as this increased your heart rate. Begin Xopenex via nebulizer every 4-6 hours as needed for cough or wheeze  Allergic rhinitis Continue Flonase 1-2 sprays in each nostril once a day as needed for stuffy nose Claritin 10 mg once a day as needed for a runny nose  Reflux Continue esomeprazole 40 mg once a day to decrease reflux Continue lifestyle changes  Call the clinic if this treatment plan is not working well for you  Follow up at your scheduled appointment with Dr. Lucie LeatherKozlow on June 10, 2018   Return in about 18 days (around 06/10/2018), or if symptoms worsen or fail to improve.    Thank you for the opportunity to care for this patient.  Please do not hesitate to contact me with questions.  Thermon LeylandAnne Alessa Mazur, FNP [facility]

## 2018-05-23 NOTE — Progress Notes (Signed)
Can you please call this patient and let her know her chest xray is clear and to continue the plan that is outlined on the AVS. Thank you. Have her call if her symptoms worsen or she does not improve. Thank you

## 2018-05-26 ENCOUNTER — Telehealth: Payer: Self-pay | Admitting: Internal Medicine

## 2018-05-26 NOTE — Telephone Encounter (Signed)
Follow up:   Patient returning call to nurse concerning some result. Patient states that the nurse has been trying to reach her. Please call patient at [phone]

## 2018-05-26 NOTE — Telephone Encounter (Signed)
Spoke with pt. Pt sts that she would like her pcp Dr.White to coordinate the ref to GI. Adv pt that I will fwd copies of her recent testing to Dr.White.  Called Dr.Whites left the message above with the operator, was told that the message would be fwd to her nurse.

## 2018-05-26 NOTE — Telephone Encounter (Signed)
Returned Brenda Daniel call. Several attempts have been made to contact the Brenda Daniel with her Cor CTA results and recommendations. A letter was mailed for the Brenda Daniel to contact the office which prompted todays phone call.  Brenda Daniel sts that she was able to review her Cor CTA results through my chart. Brenda Daniel questions regarding CTA results answered. She was contacted by [facility] and the ultrasound of her liver was performed on 05/23/18.  From a cardiac standpoint she is doing well and denies any reoccurrence of chest pain. No f/u is scheduled, she would like to know if it is needed. Adv Brenda Daniel that Dr.Acharya is rounding in the hospital this week, I will fwd her an update and call back once she has the opportunity to review. Brenda Daniel agreeable with plan and voiced appreciation for the call.

## 2018-05-26 NOTE — Telephone Encounter (Signed)
Results from ultrasound recommend further imaging. I believe this is best accomplished through GI consultation. Please arrange GI consult or have the patient arrange through PCP. Per the interpretation - findings suggests cysts, but MRI is recommended.

## 2018-05-27 ENCOUNTER — Other Ambulatory Visit: Payer: Self-pay | Admitting: Family Medicine

## 2018-05-27 ENCOUNTER — Telehealth: Payer: Self-pay

## 2018-05-27 DIAGNOSIS — K769 Liver disease, unspecified: Secondary | ICD-10-CM

## 2018-05-27 MED ORDER — AZITHROMYCIN 250 MG PO TABS: ORAL_TABLET | ORAL | 0 refills | Status: DC

## 2018-05-27 MED ORDER — PREDNISONE 10 MG PO TABS: ORAL_TABLET | ORAL | 0 refills | Status: DC

## 2018-05-27 NOTE — Telephone Encounter (Signed)
Called and spoke with patient and informed her of the medication that is being sent in and the instructions. Patient verbalized understand. Patient did inform me that she already has [employer] so I did not send that in. She is taking Mucinex and has been drinking a lot of water. She will call if she is not better after this regimen.

## 2018-05-27 NOTE — Telephone Encounter (Signed)
Patient is calling because she got better and now she is back worse since being seen on 05/23/2018 with Thurston Hole. Patient is back coughing a lot like spasams, fever is gone.   [facility]

## 2018-05-27 NOTE — Telephone Encounter (Signed)
Called and spoke with patient, she stated that she started to feel better Sunday and felt better yesterday until last night she started to feel bad again and this morning she feels worse and was cough continuously for roughly 30 minutes. She stated that she took her last dose of prednisone this morning. She stated that she is still coughing up yellowish-brown mucus. Patient is wondering what else she can do. Please advise.

## 2018-05-27 NOTE — Telephone Encounter (Signed)
Can you please order the following things for her: Prednisolone 20 mg twice a day for 3 days, two tablets once a day for 1 day, and 1 tablet on the 5th day, then stop. Tessalon Perles 100 mg three times a day as needed for cough Azithromycin 500 mg on the first day, then 250 mg once a day for the next 4 days. Please ask her to be aggressive with nasal saline rinses and use of Flonase once a day. Increase fluids as she can tolerate and take Mucinex [phone] mg twice a day. Have her call back if her condition worsens or does not improve. Thank you so much

## 2018-06-10 ENCOUNTER — Ambulatory Visit (INDEPENDENT_AMBULATORY_CARE_PROVIDER_SITE_OTHER): Payer: No Typology Code available for payment source | Admitting: Allergy and Immunology

## 2018-06-10 ENCOUNTER — Encounter: Payer: Self-pay | Admitting: Allergy and Immunology

## 2018-06-10 VITALS — BP 142/80 | HR 88 | Resp 16 | Ht 63.0 in | Wt 182.0 lb

## 2018-06-10 DIAGNOSIS — K219 Gastro-esophageal reflux disease without esophagitis: Secondary | ICD-10-CM

## 2018-06-10 DIAGNOSIS — J4540 Moderate persistent asthma, uncomplicated: Secondary | ICD-10-CM | POA: Diagnosis not present

## 2018-06-10 DIAGNOSIS — J3089 Other allergic rhinitis: Secondary | ICD-10-CM

## 2018-06-10 MED ORDER — FLUTICASONE PROPIONATE 50 MCG/ACT NA SUSP: 1.0000 | Freq: Every day | NASAL | 5 refills | Status: DC

## 2018-06-10 MED ORDER — ESOMEPRAZOLE MAGNESIUM 40 MG PO CPDR: 40.0000 mg | DELAYED_RELEASE_CAPSULE | Freq: Every day | ORAL | 1 refills | Status: DC | PRN

## 2018-06-10 MED ORDER — MONTELUKAST SODIUM 10 MG PO TABS: 10.0000 mg | ORAL_TABLET | Freq: Every evening | ORAL | 1 refills | Status: DC | PRN

## 2018-06-10 NOTE — Progress Notes (Signed)
Follow-up Note  Referring Provider: Laurann Montana, MD Primary Provider: Laurann Montana, MD Date of Office Visit: 06/10/2018  Subjective:   Brenda Daniel (DOB: Jan 12, 1956) is a 63 y.o. female [facility] in re-evaluation of the following:  HPI: Brenda Daniel returns to this clinic in reevaluation of her asthma and allergic rhinitis and reflux induced respiratory disease.  I have not seen her in this clinic since 18 March 2018 at which point in time she was doing very well.  She continued to do well with all of her airway issues while using Flovent mostly 1 time per day and occasional nasal steroid and montelukast on a consistent basis.  Rarely did she use a short acting bronchodilator and she did not require systemic steroid or antibiotic for any type of airway issue.  As well, her reflux was under very good control with just an H2 receptor blocker.  Unfortunately, New Year's Eve she started to develop a cough and then quickly evolved into a high fever and chills and aches for which she was seen in this clinic by our nurse practitioner and given a course of prednisone and then subsequently azithromycin and another course of prednisone.  Her prolonged event went on about 2 weeks and she missed a fair amount of work secondary to respiratory tract symptoms and just feeling awful in general.  Fortunately, over the course of the past week she is much better and she has gone back to work and a lot of her coughing has resolved.  She did add in Nexium during that timeframe given her previous history of significant reflux induced respiratory disease and she increased her dose of Flovent as well during that episode.  Brenda Daniel also has a supply of Symbicort and Breo 200 that was given to her over the course of the past year and she wonders if she can substitute this for her Flovent as each 1 of these inhalers has been quite expensive to purchase.  She  did obtain the flu vaccine.  Allergies as of 06/10/2018      Reactions   Advil [ibuprofen] Other (See Comments)   dizzy   Niacin And Related Other (See Comments)   dizzy   Red Yeast Rice [cholestin] Other (See Comments)   dizzy      Medication List      albuterol 108 (90 Base) MCG/ACT inhaler Commonly known as:  PROAIR HFA 2 puffs every 4-6 hours as needed for cough or wheezing.   albuterol (2.5 MG/3ML) 0.083% nebulizer solution Commonly known as:  PROVENTIL Take 3 mLs (2.5 mg total) by nebulization every 6 (six) hours as needed for wheezing or shortness of breath.   clobetasol cream 0.05 % Commonly known as:  TEMOVATE Apply 1 application topically at bedtime as needed (itching on left nipple).   cyclobenzaprine 10 MG tablet Commonly known as:  FLEXERIL   diclofenac sodium 1 % Gel Commonly known as:  VOLTAREN Apply 2 g topically 4 (four) times daily.   esomeprazole 40 MG capsule Commonly known as:  NEXIUM Take 40 mg by mouth daily as needed (acid reflux/cough).   fluticasone 110 MCG/ACT inhaler Commonly known as:  FLOVENT HFA Inhale 2 puffs into the lungs 2 (two) times daily as needed (cough).   fluticasone 50 MCG/ACT nasal spray Commonly known as:  FLONASE Place 1 spray into both nostrils daily.   ipratropium-albuterol 0.5-2.5 (3) MG/3ML Soln Commonly known as:  DUONEB Take 3 mLs  by nebulization every 4 (four) hours as needed.   levalbuterol 1.25 MG/3ML nebulizer solution Commonly known as:  XOPENEX Take 1.25 mg by nebulization every 4 (four) hours as needed for up to 30 doses for wheezing or shortness of breath (cough, wheeze, or shortness of breath).   loratadine 10 MG tablet Commonly known as:  CLARITIN Take 10 mg by mouth at bedtime as needed (seasonal allergies).   losartan 100 MG tablet Commonly known as:  COZAAR Take 100 mg by mouth daily.   metoprolol tartrate 100 MG tablet Commonly known as:  LOPRESSOR Take 1 tablet (100mg ) 2 hours prior to  your Cardiac CT   montelukast 10 MG tablet Commonly known as:  SINGULAIR Take one tablet once daily   POTASSIUM PO Take 1 tablet by mouth at bedtime.   predniSONE 10 MG tablet Commonly known as:  DELTASONE Take 20 mg twice a day for 3 days, 20 mg once a day for 1 day, and 10 mg on the 5th day, then stop.   ranitidine 300 MG tablet Commonly known as:  ZANTAC TAKE 1 TABLET EVERY EVENING DURING FLARE AS DIRECTED   traZODone 50 MG tablet Commonly known as:  DESYREL Take 25 mg by mouth at bedtime.   triamterene-hydrochlorothiazide 75-50 MG tablet Commonly known as:  MAXZIDE Take 1 tablet by mouth daily.   valACYclovir 500 MG tablet Commonly known as:  VALTREX Take 500 mg by mouth See admin instructions. Take one tablet (500 mg) by mouth daily at bedtime, take four tablets (2000 mg) once as one dose as needed at onset of fever blister   VITAMIN D PO Take 1 tablet by mouth See admin instructions. Take one tablet by mouth daily on Monday thru Friday (skip Saturday and Sunday)       Past Medical History:  Diagnosis Date  . Allergic rhinitis    per Dr. Lucie Leather  . Asthma    per Dr. Lucie Leather  . DJD (degenerative joint disease)   . GERD (gastroesophageal reflux disease)   . Hyperlipidemia   . Hypertension     Past Surgical History:  Procedure Laterality Date  . APPENDECTOMY    . BRAVO PH STUDY N/A 03/16/2015   Procedure: BRAVO PH STUDY;  Surgeon: Charlott Rakes, MD;  Location: [facility];  Service: Endoscopy;  Laterality: N/A;  . CESAREAN SECTION    . ESOPHAGOGASTRODUODENOSCOPY N/A 03/16/2015   Procedure: ESOPHAGOGASTRODUODENOSCOPY (EGD);  Surgeon: Charlott Rakes, MD;  Location: [facility];  Service: Endoscopy;  Laterality: N/A;  . MYOMECTOMY    . TEMPOROMANDIBULAR JOINT SURGERY Right   . TONSILLECTOMY      Review of systems negative except as noted in HPI / PMHx or noted below:  Review of Systems  Constitutional: Negative.   HENT: Negative.   Eyes: Negative.    Respiratory: Negative.   Cardiovascular: Negative.   Gastrointestinal: Negative.   Genitourinary: Negative.   Musculoskeletal: Negative.   Skin: Negative.   Neurological: Negative.   Endo/Heme/Allergies: Negative.   Psychiatric/Behavioral: Negative.      Objective:   Vitals:   06/10/18 1720  BP: (!) 142/80  Pulse: 88  Resp: 16  SpO2: 94%   Height: 5\' 3"  (160 cm)  Weight: 182 lb (82.6 kg)   Physical Exam Constitutional:      Appearance: She is not diaphoretic.  HENT:     Head: Normocephalic.     Right Ear: Tympanic membrane, ear canal and external ear normal.     Left Ear: Tympanic membrane, ear  canal and external ear normal.     Nose: Nose normal. No mucosal edema or rhinorrhea.     Mouth/Throat:     Pharynx: Uvula midline. No oropharyngeal exudate.  Eyes:     Conjunctiva/sclera: Conjunctivae normal.  Neck:     Thyroid: No thyromegaly.     Trachea: Trachea normal. No tracheal tenderness or tracheal deviation.  Cardiovascular:     Rate and Rhythm: Normal rate and regular rhythm.     Heart sounds: Normal heart sounds, S1 normal and S2 normal. No murmur.  Pulmonary:     Effort: No respiratory distress.     Breath sounds: Normal breath sounds. No stridor. No wheezing or rales.  Lymphadenopathy:     Head:     Right side of head: No tonsillar adenopathy.     Left side of head: No tonsillar adenopathy.     Cervical: No cervical adenopathy.  Skin:    Findings: No erythema or rash.     Nails: There is no clubbing.   Neurological:     Mental Status: She is alert.     Diagnostics:    Spirometry was performed and demonstrated an FEV1 of 1.89 at 78 % of predicted.  The patient had an Asthma Control Test with the following results: ACT Total Score: 12.    Assessment and Plan:   1. Asthma, moderate persistent, well-controlled   2. Perennial allergic rhinitis   3. LPRD (laryngopharyngeal reflux disease)     1. Continue to Treat reflux:   A. Ranitidine 300  mg  1 time per day   2. Continue to treat inflammation:   A. Flonase 1-2 sprays each nostril one time per day  B. Montelukast 10mg  one time per day  C. Flovent 110 - 2 inhalations 1-2 times per day with spacer  3. If needed:   A. Antihistamine   B. Proair HFA - 2 inhalations or nebulized Xopenex neb every 4-6 hours  C. Mucinex DM - 2 tablets two times per day  4. "Action Plan" for flare up:   A. Increase Flovent 110 to 3 inhalations 3 times per day  B. Add Nexium 40 mg twice a day if needed  5. Can substitute supply of Symbicort 160 for daily use of Flovent but do not use more than 2 times per day  6. Can substitute supply of Breo 200 for daily use of Flovent but do not use more than once a day  7. Return to clinic in 6 months or earlier if problem  Brenda EvansCatherine will continue to treat inflammation of her airway with the therapy noted above and continue to treat reflux as noted above and I will assume that she will continue to do well with this plan.  Concerning her supply of Symbicort and Breo she can substitute those for Flovent until her supply runs out but certainly she should not use them anymore frequent then noted above.  Assuming she does well I will see her back in this clinic in 6 months or earlier if there is a problem.  Laurette SchimkeEric Kai Railsback, MD Allergy / Immunology [facility]

## 2018-06-10 NOTE — Patient Instructions (Addendum)
   1. Continue to Treat reflux:   A. Ranitidine 300 mg  1 time per day   2. Continue to treat inflammation:   A. Flonase 1-2 sprays each nostril one time per day  B. Montelukast 10mg  one time per day  C. Flovent 110 - 2 inhalations 1-2 times per day with spacer  3. If needed:   A. Antihistamine   B. Proair HFA - 2 inhalations or nebulized Xopenex neb every 4-6 hours  C. Mucinex DM - 2 tablets two times per day  4. "Action Plan" for flare up:   A. Increase Flovent 110 to 3 inhalations 3 times per day  B. Add Nexium 40 mg twice a day if needed  5. Can substitute supply of Symbicort 160 for daily use of Flovent but do not use more than 2 times per day  6. Can substitute supply of Breo 200 for daily use of Flovent but do not use more than once a day  7. Return to clinic in 6 months or earlier if problem

## 2018-06-11 ENCOUNTER — Encounter: Payer: Self-pay | Admitting: Allergy and Immunology

## 2018-06-12 ENCOUNTER — Ambulatory Visit
Admission: RE | Admit: 2018-06-12 | Discharge: 2018-06-12 | Disposition: A | Discharge status: Home / Self Care | Payer: No Typology Code available for payment source | Source: Ambulatory Visit | Attending: Family Medicine | Admitting: Family Medicine

## 2018-06-12 DIAGNOSIS — K769 Liver disease, unspecified: Secondary | ICD-10-CM

## 2018-06-12 MED ORDER — GADOBENATE DIMEGLUMINE 529 MG/ML IV SOLN
17.0000 mL | Freq: Once | INTRAVENOUS | Status: AC | PRN
  Administered 2018-06-12: 17 mL via INTRAVENOUS

## 2018-06-16 ENCOUNTER — Encounter: Payer: Self-pay | Admitting: Allergy and Immunology

## 2018-07-10 ENCOUNTER — Other Ambulatory Visit: Payer: Self-pay | Admitting: Otolaryngology

## 2018-07-10 DIAGNOSIS — H93A3 Pulsatile tinnitus, bilateral: Secondary | ICD-10-CM

## 2018-07-24 ENCOUNTER — Ambulatory Visit
Admission: RE | Admit: 2018-07-24 | Discharge: 2018-07-24 | Disposition: A | Discharge status: Home / Self Care | Payer: No Typology Code available for payment source | Source: Ambulatory Visit | Attending: Otolaryngology | Admitting: Otolaryngology

## 2018-07-24 DIAGNOSIS — H93A3 Pulsatile tinnitus, bilateral: Secondary | ICD-10-CM

## 2018-07-24 MED ORDER — GADOBENATE DIMEGLUMINE 529 MG/ML IV SOLN
17.0000 mL | Freq: Once | INTRAVENOUS | Status: AC | PRN
  Administered 2018-07-24: 17 mL via INTRAVENOUS

## 2018-08-08 ENCOUNTER — Other Ambulatory Visit (HOSPITAL_COMMUNITY): Payer: Self-pay | Admitting: Interventional Radiology

## 2018-08-08 DIAGNOSIS — H9313 Tinnitus, bilateral: Secondary | ICD-10-CM

## 2018-09-05 ENCOUNTER — Encounter: Payer: Self-pay | Admitting: Allergy and Immunology

## 2018-09-16 ENCOUNTER — Ambulatory Visit (HOSPITAL_COMMUNITY): Admission: RE | Admit: 2018-09-16 | Payer: No Typology Code available for payment source | Source: Ambulatory Visit

## 2018-10-23 ENCOUNTER — Ambulatory Visit (HOSPITAL_COMMUNITY): Admission: RE | Admit: 2018-10-23 | Payer: No Typology Code available for payment source | Source: Ambulatory Visit

## 2018-11-12 ENCOUNTER — Telehealth (HOSPITAL_COMMUNITY): Payer: Self-pay

## 2018-11-12 NOTE — Telephone Encounter (Signed)
Called to schedule consult with Dr. Estanislado Pandy. Pt still does not feel comfortable coming into the hospital. She would like to wait a month and then call back. AW

## 2018-11-24 ENCOUNTER — Telehealth: Payer: Self-pay

## 2018-11-24 ENCOUNTER — Other Ambulatory Visit: Payer: Self-pay | Admitting: *Deleted

## 2018-11-24 DIAGNOSIS — J4521 Mild intermittent asthma with (acute) exacerbation: Secondary | ICD-10-CM

## 2018-11-24 MED ORDER — ALBUTEROL SULFATE (2.5 MG/3ML) 0.083% IN NEBU: 2.5000 mg | INHALATION_SOLUTION | Freq: Four times a day (QID) | RESPIRATORY_TRACT | 0 refills | Status: DC | PRN

## 2018-11-24 MED ORDER — ALBUTEROL SULFATE HFA 108 (90 BASE) MCG/ACT IN AERS: 2.0000 | INHALATION_SPRAY | RESPIRATORY_TRACT | 0 refills | Status: DC | PRN

## 2018-11-24 MED ORDER — FAMOTIDINE 40 MG PO TABS: ORAL_TABLET | ORAL | 1 refills | Status: DC

## 2018-11-24 MED ORDER — RANITIDINE HCL 300 MG PO TABS: ORAL_TABLET | ORAL | 0 refills | Status: DC

## 2018-11-24 NOTE — Telephone Encounter (Signed)
Famotidine 40 mg daily to replace ranitidine

## 2018-11-24 NOTE — Telephone Encounter (Signed)
Pharmacy requested that we send in a new prescription to replace ranitidine 300mg . Would you like me to send in famotidine 40mg ?

## 2018-11-24 NOTE — Telephone Encounter (Signed)
Famotidine sent to requested pharmacy

## 2018-11-26 ENCOUNTER — Other Ambulatory Visit: Payer: Self-pay | Admitting: *Deleted

## 2018-12-04 ENCOUNTER — Other Ambulatory Visit: Payer: Self-pay | Admitting: Allergy and Immunology

## 2018-12-04 NOTE — Telephone Encounter (Signed)
Courtesy refill  

## 2018-12-21 ENCOUNTER — Other Ambulatory Visit: Payer: Self-pay | Admitting: Allergy and Immunology

## 2018-12-21 DIAGNOSIS — J4521 Mild intermittent asthma with (acute) exacerbation: Secondary | ICD-10-CM

## 2018-12-23 ENCOUNTER — Encounter: Payer: Self-pay | Admitting: Allergy and Immunology

## 2018-12-23 ENCOUNTER — Other Ambulatory Visit: Payer: Self-pay

## 2018-12-23 ENCOUNTER — Ambulatory Visit (INDEPENDENT_AMBULATORY_CARE_PROVIDER_SITE_OTHER): Payer: No Typology Code available for payment source | Admitting: Allergy and Immunology

## 2018-12-23 ENCOUNTER — Ambulatory Visit: Payer: No Typology Code available for payment source | Admitting: Allergy and Immunology

## 2018-12-23 VITALS — BP 134/88 | HR 79 | Temp 97.9°F | Resp 18

## 2018-12-23 DIAGNOSIS — J4540 Moderate persistent asthma, uncomplicated: Secondary | ICD-10-CM | POA: Diagnosis not present

## 2018-12-23 DIAGNOSIS — J3089 Other allergic rhinitis: Secondary | ICD-10-CM | POA: Diagnosis not present

## 2018-12-23 DIAGNOSIS — K219 Gastro-esophageal reflux disease without esophagitis: Secondary | ICD-10-CM

## 2018-12-23 MED ORDER — FLOVENT HFA 110 MCG/ACT IN AERO: 2.0000 | INHALATION_SPRAY | Freq: Two times a day (BID) | RESPIRATORY_TRACT | 5 refills | Status: DC | PRN

## 2018-12-23 MED ORDER — MONTELUKAST SODIUM 10 MG PO TABS: 10.0000 mg | ORAL_TABLET | Freq: Every evening | ORAL | 1 refills | Status: DC | PRN

## 2018-12-23 NOTE — Patient Instructions (Addendum)
   1. Continue to Treat reflux:   A. Famotidine 40 mg 1 time per day  2. Continue to treat inflammation:   A. Flonase 1-2 sprays each nostril one time per day  B. Montelukast 10mg  one time per day  C. Flovent 110 - 2 inhalations 1-2 times per day with spacer  3. If needed:   A. Antihistamine   B. Proair HFA - 2 inhalations or Xopenex neb every 4-6 hours  C. Mucinex DM - 2 tablets two times per day  4. "Action Plan" for flare up:   A. Increase Flovent 110 to 3 inhalations 3 times per day  B. Add Nexium 40 mg twice a day if needed  5. Obtain fall flu vaccine (and COVID vaccine)  6. Consider a exercise stress test.   7 . Return to clinic in 6 months or earlier if problem

## 2018-12-23 NOTE — Progress Notes (Signed)
[employer] [facility] [place]   Follow-up Note  Referring Provider: Laurann MontanaWhite, Cynthia, MD Primary Provider: Laurann MontanaWhite, Cynthia, MD Date of Office Visit: 12/23/2018  Subjective:   Brenda Daniel (DOB: [birth year]) is a 63 y.o. female who [facility] in re-evaluation of the following:  HPI: Brenda Daniel returns to this clinic in evaluation of asthma and allergic rhinitis and LPR.  I last saw her in this clinic on 10 June 2018.  She has done very well with her airway and has not required a systemic steroid or antibiotic or use of a short acting bronchodilator while consistently using relatively low dose anti-inflammatory medications in the form of Flovent and montelukast.  Her nose has not really been causing her much of an issue.  She believes that her reflux is under pretty good control while using her H2 receptor blocker.  She occasionally has some issues with intermittent raspy voice.  Unfortunately, during the first week of July 2020, she apparently developed shortness of breath and a heavy chest pressure during the weekend that did not appear to be exertional in nature.  This was not accompanied by cough or wheezing or other respiratory tract symptoms and she did not have any associated nasal symptoms or reflux symptoms or fever.  As well, there was not really a history of chest pain or palpitations or nausea with this issue.  Her primary care doctor gave her prednisone and within several days this issue resolved.  It should be noted that she had a coronary artery CT scan morphology study performed in January 2020 for a similar type episode which did not identify any significant degree of plaque within her coronary arteries.  Allergies as of 12/23/2018      Reactions   Advil [ibuprofen] Other (See Comments)   dizzy   Niacin And Related Other (See Comments)   dizzy   Red Yeast Rice [cholestin] Other (See Comments)   dizzy      Medication List      albuterol (2.5 MG/3ML) 0.083% nebulizer solution Commonly known as: PROVENTIL Take 3 mLs (2.5 mg total) by nebulization every 6 (six) hours as needed for wheezing or shortness of breath.   albuterol 108 (90 Base) MCG/ACT inhaler Commonly known as: VENTOLIN HFA INHALE 2 PUFFS INTO THE LUNGS EVERY 4 (FOUR) HOURS AS NEEDED FOR WHEEZING OR SHORTNESS OF BREATH.   azithromycin 250 MG tablet Commonly known as: ZITHROMAX Take 500 mg on the first day, then 250 mg once a day for the next 4 days.   benzonatate 100 MG capsule Commonly known as: Tessalon Perles Take 1 capsule (100 mg total) by mouth 3 (three) times daily as needed for cough (for disruptive daytime cough.).   clobetasol cream 0.05 % Commonly known as: TEMOVATE Apply 1 application topically at bedtime as needed (itching on left nipple).   cyclobenzaprine 10 MG tablet Commonly known as: FLEXERIL   diclofenac sodium 1 % Gel Commonly known as: Voltaren Apply 2 g topically 4 (four) times daily.   esomeprazole 40 MG capsule Commonly known as: NEXIUM TAKE 1 CAPSULE (40 MG TOTAL) BY MOUTH DAILY AS NEEDED (ACID REFLUX/COUGH).   famotidine 40 MG tablet Commonly known as: PEPCID TAKE 1 TABLET BY MOUTH ONCE DAILY   fluticasone 110 MCG/ACT inhaler Commonly known as: Flovent HFA Inhale 2 puffs into the lungs 2 (two) times daily as needed (cough).   fluticasone 50 MCG/ACT nasal spray Commonly known as:  FLONASE Place 1 spray into both nostrils daily.   ipratropium-albuterol 0.5-2.5 (3) MG/3ML Soln Commonly known as: DUONEB Take 3 mLs by nebulization every 4 (four) hours as needed.   levalbuterol 1.25 MG/3ML nebulizer solution Commonly known as: XOPENEX Take 1.25 mg by nebulization every 4 (four) hours as needed for up to 30 doses for wheezing or shortness of breath (cough, wheeze, or shortness of breath).   loratadine 10 MG tablet Commonly known as: CLARITIN Take 10 mg by mouth at bedtime  as needed (seasonal allergies).   losartan 100 MG tablet Commonly known as: COZAAR Take 100 mg by mouth daily.   metoprolol tartrate 100 MG tablet Commonly known as: LOPRESSOR Take 1 tablet (100mg ) 2 hours prior to your Cardiac CT   montelukast 10 MG tablet Commonly known as: Singulair Take 1 tablet (10 mg total) by mouth at bedtime as needed (cough).   POTASSIUM PO Take 1 tablet by mouth at bedtime.   ranitidine 300 MG tablet Commonly known as: ZANTAC Take one tablet daily   traZODone 50 MG tablet Commonly known as: DESYREL Take 25 mg by mouth at bedtime.   triamterene-hydrochlorothiazide 75-50 MG tablet Commonly known as: MAXZIDE Take 1 tablet by mouth daily.   valACYclovir 500 MG tablet Commonly known as: VALTREX Take 500 mg by mouth See admin instructions. Take one tablet (500 mg) by mouth daily at bedtime, take four tablets (2000 mg) once as one dose as needed at onset of fever blister   VITAMIN D PO Take 1 tablet by mouth See admin instructions. Take one tablet by mouth daily on Monday thru Friday (skip Saturday and Sunday)       Past Medical History:  Diagnosis Date  . Allergic rhinitis    per Dr. Neldon Mc  . Asthma    per Dr. Neldon Mc  . DJD (degenerative joint disease)   . GERD (gastroesophageal reflux disease)   . Hyperlipidemia   . Hypertension     Past Surgical History:  Procedure Laterality Date  . APPENDECTOMY    . BRAVO [place] STUDY N/A 03/16/2015   Procedure: BRAVO [place];  Surgeon: Wilford Corner, MD;  Location: [facility];  Service: Endoscopy;  Laterality: N/A;  . CESAREAN SECTION    . ESOPHAGOGASTRODUODENOSCOPY N/A 03/16/2015   Procedure: ESOPHAGOGASTRODUODENOSCOPY (EGD);  Surgeon: Wilford Corner, MD;  Location: [facility];  Service: Endoscopy;  Laterality: N/A;  . MYOMECTOMY    . TEMPOROMANDIBULAR JOINT SURGERY Right   . TONSILLECTOMY      Review of systems negative except as noted in HPI / PMHx or noted below:  Review of Systems   Constitutional: Negative.   HENT: Negative.   Eyes: Negative.   Respiratory: Negative.   Cardiovascular: Negative.   Gastrointestinal: Negative.   Genitourinary: Negative.   Musculoskeletal: Negative.   Skin: Negative.   Neurological: Negative.   Endo/Heme/Allergies: Negative.   Psychiatric/Behavioral: Negative.      Objective:   Vitals:   12/23/18 1625  BP: 134/88  Pulse: 79  Resp: 18  Temp: 97.9 F (36.6 C)  SpO2: 96%          Physical Exam Constitutional:      Appearance: She is not diaphoretic.  HENT:     Head: Normocephalic.     Right Ear: Tympanic membrane, ear canal and external ear normal.     Left Ear: Tympanic membrane, ear canal and external ear normal.     Nose: Nose normal. No mucosal edema or rhinorrhea.     Mouth/Throat:  Pharynx: Uvula midline. No oropharyngeal exudate.  Eyes:     Conjunctiva/sclera: Conjunctivae normal.  Neck:     Thyroid: No thyromegaly.     Trachea: Trachea normal. No tracheal tenderness or tracheal deviation.  Cardiovascular:     Rate and Rhythm: Normal rate and regular rhythm.     Heart sounds: Normal heart sounds, S1 normal and S2 normal. No murmur.  Pulmonary:     Effort: No respiratory distress.     Breath sounds: Normal breath sounds. No stridor. No wheezing or rales.  Lymphadenopathy:     Head:     Right side of head: No tonsillar adenopathy.     Left side of head: No tonsillar adenopathy.     Cervical: No cervical adenopathy.  Skin:    Findings: No erythema or rash.     Nails: There is no clubbing.   Neurological:     Mental Status: She is alert.     Diagnostics:    Spirometry was performed and demonstrated an FEV1 of 2.18 at 90 % of predicted.  The patient had an Asthma Control Test with the following results: ACT Total Score: 18.    Assessment and Plan:   1. Asthma, moderate persistent, well-controlled   2. Perennial allergic rhinitis   3. LPRD (laryngopharyngeal reflux disease)     1.  Continue to Treat reflux:   A. Famotidine 40 mg 1 time per day  2. Continue to treat inflammation:   A. Flonase 1-2 sprays each nostril one time per day  B. Montelukast 10mg  one time per day  C. Flovent 110 - 2 inhalations 1-2 times per day with spacer  3. If needed:   A. Antihistamine   B. Proair HFA - 2 inhalations or Xopenex neb every 4-6 hours  C. Mucinex DM - 2 tablets two times per day  4. "Action Plan" for flare up:   A. Increase Flovent 110 to 3 inhalations 3 times per day  B. Add Nexium 40 mg twice a day if needed  5. Obtain fall flu vaccine (and COVID vaccine)  6. Consider a exercise stress test.   7 . Return to clinic in 6 months or earlier if problem  Overall Brenda Daniel appears to be doing relatively well regarding her asthma and allergic rhinitis and LPR and I see no need for changing any of her therapy at this point in time.  About every 6 months she develops this episode of very significant shortness of breath and chest pressure with etiology unknown.  There does not appear to be any associated respiratory track or cardiac or GI symptomatology to suggest the etiologic agent responsible for these episodes.  Basically these episodes are very atypical for common pulmonary, cardiac, and GI diseases.  I informed Brenda Daniel that if I was advising a family member about the next step of evaluation I would advise that she undergo a cardiac stress test assuming she can complete that stress test.  She will discuss this further with her primary care doctor.  I think a normal stress test would put everyone's mind at ease that this was not a cardiac problem.  She will maintain therapy with anti-inflammatory agents and therapy directed against reflux to address the inflammatory component of her airway disease and reflux induced respiratory disease.  I will see her back in this clinic in 6 months or earlier if there is a problem.  Laurette SchimkeEric , MD Allergy / Immunology [facility]

## 2018-12-24 ENCOUNTER — Encounter: Payer: Self-pay | Admitting: Allergy and Immunology

## 2018-12-25 ENCOUNTER — Other Ambulatory Visit: Payer: Self-pay | Admitting: Allergy and Immunology

## 2019-01-17 ENCOUNTER — Other Ambulatory Visit: Payer: Self-pay | Admitting: Allergy and Immunology

## 2019-01-17 DIAGNOSIS — J4521 Mild intermittent asthma with (acute) exacerbation: Secondary | ICD-10-CM

## 2019-02-19 DIAGNOSIS — J45909 Unspecified asthma, uncomplicated: Secondary | ICD-10-CM | POA: Insufficient documentation

## 2019-02-19 DIAGNOSIS — H8102 Meniere's disease, left ear: Secondary | ICD-10-CM | POA: Insufficient documentation

## 2019-02-19 DIAGNOSIS — H93A2 Pulsatile tinnitus, left ear: Secondary | ICD-10-CM | POA: Insufficient documentation

## 2019-02-19 DIAGNOSIS — H9042 Sensorineural hearing loss, unilateral, left ear, with unrestricted hearing on the contralateral side: Secondary | ICD-10-CM | POA: Insufficient documentation

## 2019-03-03 ENCOUNTER — Ambulatory Visit: Payer: Self-pay

## 2019-03-03 ENCOUNTER — Other Ambulatory Visit: Payer: Self-pay

## 2019-03-03 ENCOUNTER — Ambulatory Visit (INDEPENDENT_AMBULATORY_CARE_PROVIDER_SITE_OTHER): Payer: No Typology Code available for payment source | Admitting: Orthopaedic Surgery

## 2019-03-03 ENCOUNTER — Encounter: Payer: Self-pay | Admitting: Orthopaedic Surgery

## 2019-03-03 VITALS — BP 129/86 | HR 81 | Ht 63.0 in | Wt 180.0 lb

## 2019-03-03 DIAGNOSIS — M25521 Pain in right elbow: Secondary | ICD-10-CM | POA: Diagnosis not present

## 2019-03-03 DIAGNOSIS — M79672 Pain in left foot: Secondary | ICD-10-CM

## 2019-03-03 DIAGNOSIS — M7702 Medial epicondylitis, left elbow: Secondary | ICD-10-CM | POA: Diagnosis not present

## 2019-03-03 DIAGNOSIS — M79673 Pain in unspecified foot: Secondary | ICD-10-CM | POA: Insufficient documentation

## 2019-03-03 DIAGNOSIS — M79671 Pain in right foot: Secondary | ICD-10-CM | POA: Insufficient documentation

## 2019-03-03 DIAGNOSIS — Z20822 Contact with and (suspected) exposure to covid-19: Secondary | ICD-10-CM

## 2019-03-03 MED ORDER — METHYLPREDNISOLONE ACETATE 40 MG/ML IJ SUSP
30.0000 mg | INTRAMUSCULAR | Status: AC | PRN
  Administered 2019-03-03: 30 mg via INTRA_ARTICULAR

## 2019-03-03 MED ORDER — LIDOCAINE HCL 1 % IJ SOLN
1.0000 mL | INTRAMUSCULAR | Status: AC | PRN
  Administered 2019-03-03: 1 mL

## 2019-03-03 MED ORDER — BUPIVACAINE HCL 0.25 % IJ SOLN
0.6600 mL | INTRAMUSCULAR | Status: AC | PRN
  Administered 2019-03-03: .66 mL via INTRA_ARTICULAR

## 2019-03-03 NOTE — Progress Notes (Signed)
Office Visit Note   Patient: Brenda Daniel           Date of Birth: April 10, 1956           MRN: [id number] Visit Date: 03/03/2019              Requested by: Laurann Montana, MD [phone] [name] [place],  [place] [address] PCP: Laurann Montana, MD   Assessment & Plan: Visit Diagnoses:  1. Pain in right elbow   2. Medial epicondylitis of elbow, left   3. Pain in left foot   4. Pain in right foot     Plan:  #1: Corticosteroid injection to the medial epicondylar area performed by Dr. Cleophas Dunker with excellent results. #2: Given arch supports for her shoes  Follow-Up Instructions: Return if symptoms worsen or fail to improve.   Orders:  Orders Placed This Encounter  Procedures  . Medium Joint Inj  . XR Elbow 2 Views Right   No orders of the defined types were placed in this encounter.     Procedures: Medium Joint Inj: R elbow (Right Medial Epicondylitis) on 03/03/2019 9:07 AM Details: medial approach Medications: 1 mL lidocaine 1 %; 0.66 mL bupivacaine 0.25 %; 30 mg methylPREDNISolone acetate 40 MG/ML Outcome: tolerated well, no immediate complications  Injected by Dr Cleophas Dunker Procedure, treatment alternatives, risks and benefits explained, specific risks discussed. Consent was given by the patient. Immediately prior to procedure a time out was called to verify the correct patient, procedure, equipment, support staff and site/side marked as required. Patient was prepped and draped in the usual sterile fashion.       Clinical Data: No additional findings.   Subjective: Chief Complaint  Patient presents with  . Right Elbow - Pain  HPI: Patient present today for right elbow pain. She said that it has been hurting for 6 weeks medially. No known injury. It hurts when she pushes on things, while doing yoga, and brushing her teeth. She can only take Advil occasionally. She has tried icing and Voltaren, but neither help. She said that it appears swollen. She  is right hand dominant.  No Lateral pain. She also has complaints of bilateral foot cramps in her arch area. She said that it happens when she points her toes. Both feet do not usually cramp at the same time. She noticed it a couple years ago, and happens more frequently over time.    Review of Systems  Constitutional: Negative for fatigue.  HENT: Negative for ear pain.   Eyes: Negative for pain.  Respiratory: Negative for shortness of breath.   Cardiovascular: Negative for leg swelling.  Gastrointestinal: Negative for constipation and diarrhea.  Endocrine: Negative for cold intolerance and heat intolerance.  Genitourinary: Negative for difficulty urinating.  Musculoskeletal: Positive for joint swelling.  Skin: Negative for rash.  Allergic/Immunologic: Negative for food allergies.  Neurological: Negative for weakness.  Hematological: Does not bruise/bleed easily.  Psychiatric/Behavioral: Positive for sleep disturbance.     Objective: Vital Signs: BP 129/86   Pulse 81   Ht 5\' 3"  (1.6 m)   Wt 180 lb (81.6 kg)   BMI 31.89 kg/m   Physical Exam Constitutional:      Appearance: Normal appearance. She is well-developed.  HENT:     Head: Normocephalic.  Eyes:     Pupils: Pupils are equal, round, and reactive to light.  Pulmonary:     Effort: Pulmonary effort is normal.  Skin:    General: Skin is warm  and dry.  Neurological:     Mental Status: She is alert and oriented to person, place, and time.  Psychiatric:        Behavior: Behavior normal.     Ortho Exam  Full ROM of the right elbow.  Some swelling about medial epicondyle with tenderness to palpation.  No erythema, warmth, or ecchymosis.  Ligamentously stable. Good strength but pain with resistance against palmer flexion.   Specialty Comments:  No specialty comments available.  Imaging: Xr Elbow 2 Views Right  Result Date: 03/03/2019 Two view xray reveals medial and lateral epicondylar spurs. Some DJD medial  elbow     PMFS History: Patient Active Problem List   Diagnosis Date Noted  . Medial epicondylitis of elbow, left 03/03/2019  . Arch pain, unspecified laterality 03/03/2019  . Pain in right foot 03/03/2019  . Cough 05/23/2018  . Moderate persistent asthma with acute exacerbation 05/23/2018  . Perennial allergic rhinitis 05/23/2018  . Carpal tunnel syndrome, left upper limb 02/18/2017  . Pain in left hip 04/23/2016  . Pain in both upper extremities 04/23/2016  . Mild intermittent asthma with acute exacerbation 06/01/2015  . LPRD (laryngopharyngeal reflux disease) 06/01/2015  . Allergic rhinoconjunctivitis 06/01/2015  . Morbid obesity ([place]) 04/02/2015  . GERD (gastroesophageal reflux disease) 03/16/2015  . Upper airway cough syndrome 03/16/2015   Past Medical History:  Diagnosis Date  . Allergic rhinitis    per Dr. Neldon Mc  . Asthma    per Dr. Neldon Mc  . DJD (degenerative joint disease)   . GERD (gastroesophageal reflux disease)   . Hyperlipidemia   . Hypertension     Family History  Problem Relation Age of Onset  . Heart disease Father   . Heart attack Father   . Stroke Father   . Allergies Sister   . Eczema Sister   . Asthma Sister   . Hypertension Sister   . Hypertension Other        x 4 siblings  . Heart attack Brother   . Hypertension Brother   . Heart attack Paternal Grandfather   . Hypertension Sister     Past Surgical History:  Procedure Laterality Date  . APPENDECTOMY    . BRAVO [place] STUDY N/A 03/16/2015   Procedure: BRAVO [place];  Surgeon: Wilford Corner, MD;  Location: [facility];  Service: Endoscopy;  Laterality: N/A;  . CESAREAN SECTION    . ESOPHAGOGASTRODUODENOSCOPY N/A 03/16/2015   Procedure: ESOPHAGOGASTRODUODENOSCOPY (EGD);  Surgeon: Wilford Corner, MD;  Location: [facility];  Service: Endoscopy;  Laterality: N/A;  . MYOMECTOMY    . TEMPOROMANDIBULAR JOINT SURGERY Right   . TONSILLECTOMY     Social History   Occupational History   . Occupation: judge  Tobacco Use  . Smoking status: Never Smoker  . Smokeless tobacco: Never Used  Substance and Sexual Activity  . Alcohol use: Yes    Alcohol/week: 0.0 standard drinks    Comment: glass a wine a day  . Drug use: No  . Sexual activity: Yes

## 2019-03-03 NOTE — Patient Instructions (Addendum)
Golfer's Elbow Rehab Ask your health care provider which exercises are safe for you. Do exercises exactly as told by your health care provider and adjust them as directed. It is normal to feel mild stretching, pulling, tightness, or discomfort as you do these exercises. Stop right away if you feel sudden pain or your pain gets worse. Do not begin these exercises until told by your health care provider. Stretching and range-of-motion exercises These exercises warm up your muscles and joints and improve the movement and flexibility of your elbow. Wrist extension  Golfer's Elbow  Golfer's elbow, also called medial epicondylitis, is a condition that results from inflammation of the strong bands of tissue (tendons) that attach your forearm muscles to the inside of your bone at the elbow. These tendons affect the muscles that bend the palm toward the wrist (flexion). The tendons become less flexible with age. This condition is called golfer's elbow because it is more common among people who constantly bend and twist their wrists, such as golfers. This injury is usually caused by overuse. What are the causes? This condition is caused by:  Repeatedly flexing, turning, or twisting your wrist.  Constantly gripping objects with your hands. What increases the risk? This condition is more likely to develop in people who play golf, baseball, or tennis. This injury is more common among people who have jobs that require the constant use of their hands, such as:  Carpenters.  Butchers.  Musicians.  Typists. What are the signs or symptoms? This condition causes elbow pain that may spread to your forearm and upper arm. Symptoms of this condition include.  Pain at the inner elbow, forearm, or wrist.  Reduced grip strength. The pain may get worse when you bend your wrist downward. How is this diagnosed? This condition is diagnosed based on your symptoms, medical history, and a physical exam. During the  exam, your health care provider may:  Test your grip strength.  Move your wrist to check for pain. You may also have an MRI to:  Confirm the diagnosis.  Look for other issues.  Check for tears in the ligaments, muscles, or tendons. How is this treated? Treatment for this condition includes:  Stopping all activities that make you bend or twist your elbow or wrist and waiting until your pain and other symptoms go away before resuming those activities.  Wearing an elbow brace or wrist splint to restrict the movements that cause symptoms.  Icing your inner elbow, forearm, or wrist to relieve pain.  Taking NSAIDs or getting corticosteroid injections to reduce pain and swelling.  Doing stretching, range-of-motion, and strengthening exercises (physical therapy) as told by your health care provider. In rare cases, surgery may be needed if your condition does not improve. Follow these instructions at home: If you have a brace or splint:  Wear it as told by your health care provider.  Loosen it if your fingers tingle, become numb, or turn cold and blue.  Keep it clean. Managing pain, stiffness, and swelling   If directed, put ice on the injured area. ? Put ice in a plastic bag. ? Place a towel between your skin and the bag. ? Leave the ice on for 20 minutes, 2-3 times a day.  Move your fingers often to avoid stiffness.  Raise (elevate) the injured area above the level of your heart while you are sitting or lying down. Activity  Rest your injured area as told by your health care provider.  Return to your normal  activities as told by your health care provider. Ask your health care provider what activities are safe for you.  Do exercises as told by your health care provider. Lifestyle  If your condition is caused by sports, work with a trainer to make sure that you: ? Have the correct technique. ? Are using the proper equipment.  If your condition is work related, talk  with your employer about changes that can be made. General instructions  Take over-the-counter and prescription medicines only as told by your health care provider.  Do not use any products that contain nicotine or tobacco, such as cigarettes, e-cigarettes, and chewing tobacco. If you need help quitting, ask your health care provider.  Keep all follow-up visits as told by your health care provider. This is important. How is this prevented?  Before and after activity: ? Warm up and stretch before being active. ? Cool down and stretch after being active. ? Give your body time to rest between periods of activity.  During activity: ? Make sure to use equipment that fits you. ? If you play golf, slow your golf swing to reduce shock in the arm when making contact with the ball.  Maintain physical fitness, including: ? Strength. ? Flexibility. ? Cardiovascular fitness. ? Endurance.  Do exercises to strengthen the forearm muscles. Contact a health care provider if:  Your pain does not improve or it gets worse.  You notice numbness in your hand. Get help right away if:  Your pain is severe.  You cannot move your wrist. Summary  Golfer's elbow, also called medial epicondylitis, is a condition that results from inflammation of the strong bands of tissue (tendons) that attach your forearm muscles to the inside of your bone at the elbow.  This injury usually results from overuse.  Symptoms of this condition include decreased grip strength and pain at the inner elbow, forearm, or wrist.  This injury is treated with rest, ice, medicines, physical therapy, and surgery as needed. This information is not intended to replace advice given to you by your health care provider. Make sure you discuss any questions you have with your health care provider. Document Released: 05/07/2005 Document Revised: 08/28/2018 Document Reviewed: 03/13/2018 Elsevier Patient Education  2020 [place].   1. Straighten your left / right elbow in front of you with your palm facing up toward the ceiling. ? If told by your health care provider, bend your left / right elbow to a 90-degree angle (right angle) at your side. 2. With your other hand, gently pull your left / right hand and fingers toward the floor (extension). Stop when you feel a gentle stretch on the palm side of your forearm. 3. Hold this position for __________ seconds. Repeat __________ times. Complete this exercise __________ times a day. Wrist flexion  1. Straighten your left / right elbow in front of you with your palm facing down toward the floor. ? If told by your health care provider, bend your left / right elbow to a 90-degree angle (right angle) at your side. 2. With your other hand, gently push over the back of your left / right hand so your fingers point toward the floor (flexion). Stop when you feel a gentle stretch on the back of your forearm. 3. Hold this position for __________ seconds. Repeat __________ times. Complete this exercise __________ times a day. Forearm rotation, supination 1. Sit or stand with your elbows at your side. 2. Bend your left / right elbow to a  90-degree angle (right angle). 3. Using your uninjured hand, turn your left / right palm up toward the ceiling (supination) until you feel a gentle stretch along the inside of your forearm. 4. Hold this position for __________ seconds. Repeat __________ times. Complete this exercise __________ times a day. Forearm rotation, pronation 1. Sit or stand with your elbows at your side. 2. Bend your left / right elbow to a 90-degree angle (right angle). 3. Using your uninjured hand, turn your left / right palm down toward the floor (pronation) until you feel a gentle stretch along the top of your forearm. 4. Hold this position for __________ seconds. Repeat __________ times. Complete this exercise __________ times a day. Strengthening exercises These  exercises build strength and endurance in your elbow. Endurance is the ability to use your muscles for a long time, even after they get tired. Wrist flexion  1. Sit with your left / right forearm supported on a table or other surface and your palm turned up toward the ceiling. Let your left / right wrist extend over the edge of the surface. 2. Hold a __________ weight or a piece of rubber exercise band or tubing. ? If using a rubber exercise band or tubing, hold the other end of the tubing with your other hand. 3. Slowly bend your wrist so your hand moves up toward the ceiling (flexion). Try to only move your wrist and keep the rest of your arm still. 4. Hold this position for __________ seconds. 5. Slowly return to the starting position. Repeat __________ times. Complete this exercise __________ times a day. Wrist flexion, eccentric 1. Sit with your left / right forearm palm-up and supported on a table or other surface. Let your left / right wrist extend over the edge of the surface. 2. Hold a __________ weight or a piece of rubber exercise band or tubing in your left / right hand. ? If using a rubber exercise band or tubing, hold the other end of the tubing with your other hand. 3. Use your uninjured hand to move your left / right hand up toward the ceiling. 4. Take your uninjured hand away and slowly return to the starting position using only your left / right hand (eccentric flexion). Repeat __________ times. Complete this exercise __________ times a day. Forearm rotation, pronation To do this exercise, you will need a lightweight hammer or rubber mallet. 1. Sit with your left / right forearm supported on a table or other surface. Bend your elbow to a 90-degree angle (right angle). Position your forearm so that your palm is facing up toward the ceiling, with your hand resting over the edge of the table. 2. Hold a hammer in your left / right hand. ? To make this exercise easier, hold the  hammer near the head of the hammer. ? To make this exercise harder, hold the hammer near the end of the handle. 3. Without moving your elbow, slowly turn (rotate) your forearm so your palm faces down toward the floor (pronation). 4. Hold this position for __________ seconds. 5. Slowly return to the starting position. Repeat __________ times. Complete this exercise __________ times a day. Shoulder blade squeeze 1. Sit in a stable chair or stand with good posture. If you are sitting down, do not let your back touch the back of the chair. 2. Your arms should be at your sides with your elbows bent to a 90-degree angle (right angle). Position your forearms so that your thumbs are facing the  ceiling (neutral position). 3. Without lifting your shoulders up, squeeze your shoulder blades tightly together. 4. Hold this position for __________ seconds. 5. Slowly release and return to the starting position. Repeat __________ times. Complete this exercise __________ times a day. This information is not intended to replace advice given to you by your health care provider. Make sure you discuss any questions you have with your health care provider. Document Released: 05/07/2005 Document Revised: 08/28/2018 Document Reviewed: 07/01/2018 Elsevier Patient Education  2020 [employer] Golfer's Elbow  Golfer's elbow, also called medial epicondylitis, is a condition that results from inflammation of the strong bands of tissue (tendons) that attach your forearm muscles to the inside of your bone at the elbow. These tendons affect the muscles that bend the palm toward the wrist (flexion). The tendons become less flexible with age. This condition is called golfer's elbow because it is more common among people who constantly bend and twist their wrists, such as golfers. This injury is usually caused by overuse. What are the causes? This condition is caused by:  Repeatedly flexing, turning, or twisting your wrist.   Constantly gripping objects with your hands. What increases the risk? This condition is more likely to develop in people who play golf, baseball, or tennis. This injury is more common among people who have jobs that require the constant use of their hands, such as:  Carpenters.  Butchers.  Musicians.  Typists. What are the signs or symptoms? This condition causes elbow pain that may spread to your forearm and upper arm. Symptoms of this condition include.  Pain at the inner elbow, forearm, or wrist.  Reduced grip strength. The pain may get worse when you bend your wrist downward. How is this diagnosed? This condition is diagnosed based on your symptoms, medical history, and a physical exam. During the exam, your health care provider may:  Test your grip strength.  Move your wrist to check for pain. You may also have an MRI to:  Confirm the diagnosis.  Look for other issues.  Check for tears in the ligaments, muscles, or tendons. How is this treated? Treatment for this condition includes:  Stopping all activities that make you bend or twist your elbow or wrist and waiting until your pain and other symptoms go away before resuming those activities.  Wearing an elbow brace or wrist splint to restrict the movements that cause symptoms.  Icing your inner elbow, forearm, or wrist to relieve pain.  Taking NSAIDs or getting corticosteroid injections to reduce pain and swelling.  Doing stretching, range-of-motion, and strengthening exercises (physical therapy) as told by your health care provider. In rare cases, surgery may be needed if your condition does not improve. Follow these instructions at home: If you have a brace or splint:  Wear it as told by your health care provider.  Loosen it if your fingers tingle, become numb, or turn cold and blue.  Keep it clean. Managing pain, stiffness, and swelling   If directed, put ice on the injured area. ? Put ice in a plastic  bag. ? Place a towel between your skin and the bag. ? Leave the ice on for 20 minutes, 2-3 times a day.  Move your fingers often to avoid stiffness.  Raise (elevate) the injured area above the level of your heart while you are sitting or lying down. Activity  Rest your injured area as told by your health care provider.  Return to your normal activities as told by your health care  provider. Ask your health care provider what activities are safe for you.  Do exercises as told by your health care provider. Lifestyle  If your condition is caused by sports, work with a trainer to make sure that you: ? Have the correct technique. ? Are using the proper equipment.  If your condition is work related, talk with your employer about changes that can be made. General instructions  Take over-the-counter and prescription medicines only as told by your health care provider.  Do not use any products that contain nicotine or tobacco, such as cigarettes, e-cigarettes, and chewing tobacco. If you need help quitting, ask your health care provider.  Keep all follow-up visits as told by your health care provider. This is important. How is this prevented?  Before and after activity: ? Warm up and stretch before being active. ? Cool down and stretch after being active. ? Give your body time to rest between periods of activity.  During activity: ? Make sure to use equipment that fits you. ? If you play golf, slow your golf swing to reduce shock in the arm when making contact with the ball.  Maintain physical fitness, including: ? Strength. ? Flexibility. ? Cardiovascular fitness. ? Endurance.  Do exercises to strengthen the forearm muscles. Contact a health care provider if:  Your pain does not improve or it gets worse.  You notice numbness in your hand. Get help right away if:  Your pain is severe.  You cannot move your wrist. Summary  Golfer's elbow, also called medial  epicondylitis, is a condition that results from inflammation of the strong bands of tissue (tendons) that attach your forearm muscles to the inside of your bone at the elbow.  This injury usually results from overuse.  Symptoms of this condition include decreased grip strength and pain at the inner elbow, forearm, or wrist.  This injury is treated with rest, ice, medicines, physical therapy, and surgery as needed. This information is not intended to replace advice given to you by your health care provider. Make sure you discuss any questions you have with your health care provider. Document Released: 05/07/2005 Document Revised: 08/28/2018 Document Reviewed: 03/13/2018 Elsevier Patient Education  2020 [employer].

## 2019-03-05 LAB — NOVEL CORONAVIRUS, NAA: SARS-CoV-2, NAA: NOT DETECTED

## 2019-03-23 ENCOUNTER — Other Ambulatory Visit: Payer: Self-pay

## 2019-03-23 ENCOUNTER — Ambulatory Visit (HOSPITAL_COMMUNITY)
Admission: RE | Admit: 2019-03-23 | Discharge: 2019-03-23 | Disposition: A | Discharge status: Home / Self Care | Payer: No Typology Code available for payment source | Source: Ambulatory Visit | Attending: Interventional Radiology | Admitting: Interventional Radiology

## 2019-03-23 DIAGNOSIS — H9313 Tinnitus, bilateral: Secondary | ICD-10-CM

## 2019-03-23 NOTE — Consult Note (Signed)
Chief Complaint: Patient was seen in consultation today for proximal right TS stenosis and distal right SS stenosis.  Referring Physician(s): Ermalinda Barrios  Supervising Physician: Julieanne Cotton  Patient Status: [facility] - Out-pt  History of Present Illness: Brenda Daniel is a 63 y.o. female with a past medical history as below, with pertinent past medical history including hypertension, hyperlipidemia, asthma, GERD, and DJD. She has had bilateral intermittent pulsatile tinnitus, L>R for years. Her tinnitus is managed by Dr. Dorma Russell (ENT) and is currently managed conservatively. In 05/2018, her tinnitus had worsened (louder, more frequent) and she went to Dr. Dorma Russell for further evaluation, who ordered appropriate imaging scans.  MR/MRV brain/head 07/24/2018: 1. Negative MRI head with contrast with special attention to the posterior fossa. No cause for pulsatile tinnitus identified. 2. Negative MRV head.  NIR consulted by Dr. Dorma Russell for management of bilateral intermittent pulsatile tinnitus. Patient awake and alert sitting in chair. Accompanied by husband. Complains of bilateral intermittent pulsatile tinnitus, L>R. States this has occurred for years, however has worsened since 05/2018 (louder, more frequent). States tinnitus used to only happen when stooping. States that it improves with laying down. Denies headache, weakness, numbness/tingling, dizziness, vision changes, hearing changes, or speech difficulty.   Past Medical History:  Diagnosis Date  . Allergic rhinitis    per Dr. Lucie Leather  . Asthma    per Dr. Lucie Leather  . DJD (degenerative joint disease)   . GERD (gastroesophageal reflux disease)   . Hyperlipidemia   . Hypertension     Past Surgical History:  Procedure Laterality Date  . APPENDECTOMY    . BRAVO PH STUDY N/A 03/16/2015   Procedure: BRAVO PH STUDY;  Surgeon: Charlott Rakes, MD;  Location: [facility];  Service: Endoscopy;  Laterality: N/A;  . CESAREAN SECTION     . ESOPHAGOGASTRODUODENOSCOPY N/A 03/16/2015   Procedure: ESOPHAGOGASTRODUODENOSCOPY (EGD);  Surgeon: Charlott Rakes, MD;  Location: [facility];  Service: Endoscopy;  Laterality: N/A;  . MYOMECTOMY    . TEMPOROMANDIBULAR JOINT SURGERY Right   . TONSILLECTOMY      Allergies: Advil [ibuprofen], Niacin and related, and Red yeast rice [cholestin]  Medications: Prior to Admission medications   Medication Sig Start Date End Date Taking? Authorizing Provider  albuterol (PROVENTIL) (2.5 MG/3ML) 0.083% nebulizer solution Take 3 mLs (2.5 mg total) by nebulization every 6 (six) hours as needed for wheezing or shortness of breath. 11/24/18   Kozlow, Alvira Philips, MD  albuterol (VENTOLIN HFA) 108 (90 Base) MCG/ACT inhaler INHALE 2 PUFFS INTO THE LUNGS EVERY 4 (FOUR) HOURS AS NEEDED FOR WHEEZING OR SHORTNESS OF BREATH. 01/19/19   Kozlow, Alvira Philips, MD  azithromycin (ZITHROMAX) 250 MG tablet Take 500 mg on the first day, then 250 mg once a day for the next 4 days. 05/27/18   Ambs, Norvel Richards, FNP  benzonatate (TESSALON PERLES) 100 MG capsule Take 1 capsule (100 mg total) by mouth 3 (three) times daily as needed for cough (for disruptive daytime cough.). 01/08/18   Kozlow, Alvira Philips, MD  Cholecalciferol (VITAMIN D PO) Take 1 tablet by mouth See admin instructions. Take one tablet by mouth daily on Monday thru Friday (skip Saturday and Sunday)    [provider]  clobetasol cream (TEMOVATE) 0.05 % Apply 1 application topically at bedtime as needed (itching on left nipple).    [provider]  cyclobenzaprine (FLEXERIL) 10 MG tablet  04/03/18   [provider]  diclofenac sodium (VOLTAREN) 1 % GEL Apply 2 g topically 4 (four)  times daily. Patient taking differently: Apply 1 application topically 2 (two) times daily as needed (arthritis pain).  04/23/16   Valeria Batman, MD  esomeprazole (NEXIUM) 40 MG capsule TAKE 1 CAPSULE (40 MG TOTAL) BY MOUTH DAILY AS NEEDED (ACID REFLUX/COUGH). 12/04/18   Kozlow,  Alvira Philips, MD  famotidine (PEPCID) 40 MG tablet TAKE 1 TABLET BY MOUTH EVERY DAY 12/25/18   Kozlow, Alvira Philips, MD  fluticasone (FLONASE) 50 MCG/ACT nasal spray Place 1 spray into both nostrils daily. 06/10/18   Kozlow, Alvira Philips, MD  fluticasone (FLOVENT HFA) 110 MCG/ACT inhaler Inhale 2 puffs into the lungs 2 (two) times daily as needed (cough). 12/23/18   Kozlow, Alvira Philips, MD  ipratropium-albuterol (DUONEB) 0.5-2.5 (3) MG/3ML SOLN Take 3 mLs by nebulization every 4 (four) hours as needed. 08/31/16   Marcelyn Bruins, MD  levalbuterol Pauline Aus) 1.25 MG/3ML nebulizer solution Take 1.25 mg by nebulization every 4 (four) hours as needed for up to 30 doses for wheezing or shortness of breath (cough, wheeze, or shortness of breath). 05/23/18 06/10/18  Hetty Blend, FNP  loratadine (CLARITIN) 10 MG tablet Take 10 mg by mouth at bedtime as needed (seasonal allergies).    [provider]  losartan (COZAAR) 100 MG tablet Take 100 mg by mouth daily. 05/21/17   [provider]  metoprolol tartrate (LOPRESSOR) 100 MG tablet Take 1 tablet ( ) 2 hours prior to your Cardiac CT 03/25/18   Parke Poisson, MD  montelukast (SINGULAIR) 10 MG tablet Take 1 tablet (10 mg total) by mouth at bedtime as needed (cough). 12/23/18   Kozlow, Alvira Philips, MD  POTASSIUM PO Take 1 tablet by mouth at bedtime.    [provider]  predniSONE (DELTASONE) 10 MG tablet Take 20 mg twice a day for 3 days, 20 mg once a day for 1 day, and 10 mg on the 5th day, then stop. 05/27/18   Hetty Blend, FNP  ranitidine (ZANTAC) 300 MG tablet Take one tablet daily 11/24/18   Kozlow, Alvira Philips, MD  traZODone (DESYREL) 50 MG tablet Take 25 mg by mouth at bedtime.    [provider]  triamterene-hydrochlorothiazide (MAXZIDE) 75-50 MG tablet Take 1 tablet by mouth daily.    [provider]  valACYclovir (VALTREX) 500 MG tablet Take 500 mg by mouth See admin instructions. Take one tablet (500 mg) by mouth daily at bedtime, take  four tablets (2000 mg) once as one dose as needed at onset of fever blister    [provider]     Family History  Problem Relation Age of Onset  . Heart disease Father   . Heart attack Father   . Stroke Father   . Allergies Sister   . Eczema Sister   . Asthma Sister   . Hypertension Sister   . Hypertension Other        x 4 siblings  . Heart attack Brother   . Hypertension Brother   . Heart attack Paternal Grandfather   . Hypertension Sister     Social History   Socioeconomic History  . Marital status: Married    Spouse name: Not on file  . Number of children: 2  . Years of education: Not on file  . Highest education level: Not on file  Occupational History  . Occupation: judge  Social Needs  . Financial resource strain: Not on file  . Food insecurity    Worry: Not on file    Inability: Not on  file  . Transportation needs    Medical: Not on file    Non-medical: Not on file  Tobacco Use  . Smoking status: Never Smoker  . Smokeless tobacco: Never Used  Substance and Sexual Activity  . Alcohol use: Yes    Alcohol/week: 0.0 standard drinks    Comment: glass a wine a day  . Drug use: No  . Sexual activity: Yes  Lifestyle  . Physical activity    Days per week: Not on file    Minutes per session: Not on file  . Stress: Not on file  Relationships  . Social Herbalist on phone: Not on file    Gets together: Not on file    Attends religious service: Not on file    Active member of club or organization: Not on file    Attends meetings of clubs or organizations: Not on file    Relationship status: Not on file  Other Topics Concern  . Not on file  Social History Narrative  . Not on file     Review of Systems: A 12 point ROS discussed and pertinent positives are indicated in the HPI above.  All other systems are negative.  Review of Systems  Constitutional: Negative for chills and fever.  HENT: Positive for tinnitus. Negative for hearing  loss.   Eyes: Negative for visual disturbance.  Respiratory: Negative for shortness of breath and wheezing.   Cardiovascular: Negative for chest pain and palpitations.  Neurological: Negative for dizziness, speech difficulty, weakness, numbness and headaches.  Psychiatric/Behavioral: Negative for behavioral problems and confusion.    Physical Exam Constitutional:      General: She is not in acute distress.    Appearance: Normal appearance.  Pulmonary:     Effort: Pulmonary effort is normal. No respiratory distress.  Skin:    General: Skin is warm and dry.  Neurological:     Mental Status: She is alert and oriented to person, place, and time.  Psychiatric:        Mood and Affect: Mood normal.        Behavior: Behavior normal.        Thought Content: Thought content normal.        Judgment: Judgment normal.      Imaging: Xr Elbow 2 Views Right  Result Date: 03/03/2019 Two view xray reveals medial and lateral epicondylar spurs. Some DJD medial elbow    Labs:  BMP: Recent Labs    04/28/18 0931  NA 138  K 4.0  CL 96  CO2 24  GLUCOSE 89  BUN 12  CALCIUM 10.0  CREATININE 0.93  GFRNONAA 67  GFRAA 77     Assessment and Plan:  Probable proximal right TS stenosis. Probable distal right SS stenosis. Dr. Estanislado Pandy was present for consultation. Reviewed imaging with patient. Explained that based on report, there is no evidence of stenosis, but per Dr. Estanislado Pandy there is probable proximal right TS and distal right SS stenosis based on imaging scans. Explained there are two management options moving forward- either routine imaging scans to monitor for changes or with a procedure called an image-guided diagnostic cerebral arteriogram. Explained procedure, including risks and benefits. Explained that this is the gold standard- most accurate way to look at vessels in the head and neck. Patient asks that she has time to think about her decision.  Discussed patient's  hypertension. Patient states that her Bps typically run 120-130s/80-90s. Instructed patient to take her anti-hypertensive medications as prescribed,  and to follow-up with her PCP regularly regarding hypertension control.  Plan for follow-up with MRA head/neck and MRV head (without contrast) in 6 months versus an image-guided diagnostic cerebral arteriogram first available. Instructed patient to call us if she decides to move forward with procedure, if not we will plan for routine imaging scans to monitor for changes. Instructed patient to stay hydrated by drinking plenty of water.  All questions answered and concerns addressed. Patient conveys understanding and agrees with plan.  Thank you for this interesting consult.  I greatly enjoyed meeting Brenda Daniel and look forward to participating in their care.  A copy of this report was sent to the requesting provider on this date.  Electronically Signed: Elwin MochaAlexandra , PA-C 03/23/2019, 3:14 PM   I spent a total of 40 Minutes in face to face in clinical consultation, greater than 50% of which was counseling/coordinating care for proximal right TS stenosis and distal right SS stenosis.

## 2019-03-30 ENCOUNTER — Other Ambulatory Visit: Payer: Self-pay

## 2019-03-30 DIAGNOSIS — Z20822 Contact with and (suspected) exposure to covid-19: Secondary | ICD-10-CM

## 2019-03-31 LAB — NOVEL CORONAVIRUS, NAA: SARS-CoV-2, NAA: NOT DETECTED

## 2019-04-15 ENCOUNTER — Other Ambulatory Visit (HOSPITAL_COMMUNITY): Payer: Self-pay | Admitting: Interventional Radiology

## 2019-04-15 DIAGNOSIS — H93A9 Pulsatile tinnitus, unspecified ear: Secondary | ICD-10-CM

## 2019-04-22 ENCOUNTER — Other Ambulatory Visit: Payer: Self-pay | Admitting: Radiology

## 2019-04-24 ENCOUNTER — Other Ambulatory Visit: Payer: Self-pay

## 2019-04-24 ENCOUNTER — Other Ambulatory Visit (HOSPITAL_COMMUNITY): Payer: Self-pay | Admitting: Interventional Radiology

## 2019-04-24 ENCOUNTER — Ambulatory Visit (HOSPITAL_COMMUNITY)
Admission: RE | Admit: 2019-04-24 | Discharge: 2019-04-24 | Disposition: A | Discharge status: Home / Self Care | Payer: No Typology Code available for payment source | Source: Ambulatory Visit | Attending: Interventional Radiology | Admitting: Interventional Radiology

## 2019-04-24 ENCOUNTER — Encounter (HOSPITAL_COMMUNITY): Payer: Self-pay

## 2019-04-24 DIAGNOSIS — H93A9 Pulsatile tinnitus, unspecified ear: Secondary | ICD-10-CM

## 2019-04-24 DIAGNOSIS — Z79899 Other long term (current) drug therapy: Secondary | ICD-10-CM | POA: Insufficient documentation

## 2019-04-24 DIAGNOSIS — K219 Gastro-esophageal reflux disease without esophagitis: Secondary | ICD-10-CM | POA: Insufficient documentation

## 2019-04-24 DIAGNOSIS — H93A3 Pulsatile tinnitus, bilateral: Secondary | ICD-10-CM | POA: Diagnosis present

## 2019-04-24 DIAGNOSIS — J45909 Unspecified asthma, uncomplicated: Secondary | ICD-10-CM | POA: Diagnosis not present

## 2019-04-24 DIAGNOSIS — M1990 Unspecified osteoarthritis, unspecified site: Secondary | ICD-10-CM | POA: Diagnosis not present

## 2019-04-24 DIAGNOSIS — I10 Essential (primary) hypertension: Secondary | ICD-10-CM | POA: Diagnosis not present

## 2019-04-24 DIAGNOSIS — E785 Hyperlipidemia, unspecified: Secondary | ICD-10-CM | POA: Insufficient documentation

## 2019-04-24 HISTORY — PX: IR US GUIDE VASC ACCESS RIGHT: IMG2390

## 2019-04-24 HISTORY — PX: IR ANGIO INTRA EXTRACRAN SEL COM CAROTID INNOMINATE BILAT MOD SED: IMG5360

## 2019-04-24 HISTORY — PX: IR ANGIO VERTEBRAL SEL VERTEBRAL BILAT MOD SED: IMG5369

## 2019-04-24 LAB — BASIC METABOLIC PANEL
Anion gap: 9 (ref 5–15)
BUN: 15 mg/dL (ref 8–23)
CO2: 28 mmol/L (ref 22–32)
Calcium: 9.3 mg/dL (ref 8.9–10.3)
Chloride: 103 mmol/L (ref 98–111)
Creatinine, Ser: 1.17 mg/dL — ABNORMAL HIGH (ref 0.44–1.00)
GFR calc Af Amer: 58 mL/min — ABNORMAL LOW (ref >60)
GFR calc non Af Amer: 50 mL/min — ABNORMAL LOW (ref >60)
Glucose, Bld: 106 mg/dL — ABNORMAL HIGH (ref 70–99)
Potassium: 3.5 mmol/L (ref 3.5–5.1)
Sodium: 140 mmol/L (ref 135–145)

## 2019-04-24 LAB — CBC WITH DIFFERENTIAL/PLATELET
Abs Immature Granulocytes: 0 10*3/uL (ref 0.00–0.07)
Basophils Absolute: 0 10*3/uL (ref 0.0–0.1)
Basophils Relative: 0 %
Eosinophils Absolute: 0.1 10*3/uL (ref 0.0–0.5)
Eosinophils Relative: 1 %
HCT: 40.6 % (ref 36.0–46.0)
Hemoglobin: 13.7 g/dL (ref 12.0–15.0)
Immature Granulocytes: 0 %
Lymphocytes Relative: 29 %
Lymphs Abs: 1.6 10*3/uL (ref 0.7–4.0)
MCH: 30.5 pg (ref 26.0–34.0)
MCHC: 33.7 g/dL (ref 30.0–36.0)
MCV: 90.4 fL (ref 80.0–100.0)
Monocytes Absolute: 0.5 10*3/uL (ref 0.1–1.0)
Monocytes Relative: 8 %
Neutro Abs: 3.3 10*3/uL (ref 1.7–7.7)
Neutrophils Relative %: 62 %
Platelets: 280 10*3/uL (ref 150–400)
RBC: 4.49 MIL/uL (ref 3.87–5.11)
RDW: 13.1 % (ref 11.5–15.5)
WBC: 5.4 10*3/uL (ref 4.0–10.5)
nRBC: 0 % (ref 0.0–0.2)

## 2019-04-24 LAB — PROTIME-INR
INR: 0.9 (ref 0.8–1.2)
Prothrombin Time: 11.6 seconds (ref 11.4–15.2)

## 2019-04-24 MED ORDER — LIDOCAINE HCL 1 % IJ SOLN
INTRAMUSCULAR | Status: AC
  Filled 2019-04-24: qty 20

## 2019-04-24 MED ORDER — FENTANYL CITRATE (PF) 100 MCG/2ML IJ SOLN
INTRAMUSCULAR | Status: AC | PRN
  Administered 2019-04-24: 25 ug via INTRAVENOUS

## 2019-04-24 MED ORDER — IOHEXOL 300 MG/ML  SOLN
150.0000 mL | Freq: Once | INTRAMUSCULAR | Status: AC | PRN
  Administered 2019-04-24: 75 mL via INTRA_ARTERIAL

## 2019-04-24 MED ORDER — HEPARIN SODIUM (PORCINE) 1000 UNIT/ML IJ SOLN
INTRAMUSCULAR | Status: AC
  Filled 2019-04-24: qty 1

## 2019-04-24 MED ORDER — NITROGLYCERIN 1 MG/10 ML FOR IR/CATH LAB
INTRA_ARTERIAL | Status: AC
  Filled 2019-04-24: qty 10

## 2019-04-24 MED ORDER — SODIUM CHLORIDE 0.9 % IV SOLN: INTRAVENOUS | Status: AC

## 2019-04-24 MED ORDER — MIDAZOLAM HCL 2 MG/2ML IJ SOLN
INTRAMUSCULAR | Status: AC | PRN
  Administered 2019-04-24: 1 mg via INTRAVENOUS

## 2019-04-24 MED ORDER — LIDOCAINE HCL (PF) 1 % IJ SOLN
INTRAMUSCULAR | Status: AC | PRN
  Administered 2019-04-24: 5 mL

## 2019-04-24 MED ORDER — FENTANYL CITRATE (PF) 100 MCG/2ML IJ SOLN
INTRAMUSCULAR | Status: AC
  Filled 2019-04-24: qty 2

## 2019-04-24 MED ORDER — VERAPAMIL HCL 2.5 MG/ML IV SOLN
INTRAVENOUS | Status: AC
  Filled 2019-04-24: qty 2

## 2019-04-24 MED ORDER — MIDAZOLAM HCL 2 MG/2ML IJ SOLN
INTRAMUSCULAR | Status: AC
  Filled 2019-04-24: qty 2

## 2019-04-24 MED ORDER — SODIUM CHLORIDE 0.9 % IV SOLN: INTRAVENOUS | Status: DC

## 2019-04-24 MED ORDER — HEPARIN SODIUM (PORCINE) 1000 UNIT/ML IJ SOLN
INTRAMUSCULAR | Status: AC | PRN
  Administered 2019-04-24: 1000 [IU] via INTRAVENOUS

## 2019-04-24 NOTE — Procedures (Signed)
S/P 4 vessel cerebral arteriogram RT CFA approach  Findings. 1.Appro14.63mmx 11.61mm high riding  RT jugular bulb prominance,and a high riding  58mm x 14mm Lt jugular prominence. S.Hezekiah Veltre MD

## 2019-04-24 NOTE — H&P (Signed)
Chief Complaint: Patient was seen in consultation today for pulsatile tinnitus   Supervising Physician: Julieanne Cotton  Patient Status: [facility] - Out-pt  History of Present Illness: Brenda Daniel is a 63 y.o. female with past medical history of GERD, DJD, HTN, HTN recently seen in consultation with Dr. Corliss Skains for pulsatile tinnitus.  Patient has been evaluated by ENT and undergone appropriate management without resolve of her symptoms.    MR/MRV Brain/Head 3/5 was negative for cause of her pulsatile tinnitus, however upon further review, Dr. Corliss Skains did question whether there was proximal right TS and distal R SS stenosis.  After careful consideration and discussion she elects to proceed with diagnostic cerebral angiogram.  She presents for procedure today in her usual state of health.   She has been NPO.  She does not take blood thinners.  She denies tinnitus/"whoosing" today.    Past Medical History:  Diagnosis Date  . Allergic rhinitis    per Dr. Lucie Leather  . Asthma    per Dr. Lucie Leather  . DJD (degenerative joint disease)   . GERD (gastroesophageal reflux disease)   . Hyperlipidemia   . Hypertension     Past Surgical History:  Procedure Laterality Date  . APPENDECTOMY    . BRAVO PH STUDY N/A 03/16/2015   Procedure: BRAVO PH STUDY;  Surgeon: Charlott Rakes, MD;  Location: [facility];  Service: Endoscopy;  Laterality: N/A;  . CESAREAN SECTION    . ESOPHAGOGASTRODUODENOSCOPY N/A 03/16/2015   Procedure: ESOPHAGOGASTRODUODENOSCOPY (EGD);  Surgeon: Charlott Rakes, MD;  Location: [facility];  Service: Endoscopy;  Laterality: N/A;  . MYOMECTOMY    . TEMPOROMANDIBULAR JOINT SURGERY Right   . TONSILLECTOMY      Allergies: Advil [ibuprofen], Niacin and related, and Red yeast rice [cholestin]  Medications: Prior to Admission medications   Medication Sig Start Date End Date Taking? Authorizing Provider  albuterol (PROVENTIL) (2.5 MG/3ML) 0.083% nebulizer  solution Take 3 mLs (2.5 mg total) by nebulization every 6 (six) hours as needed for wheezing or shortness of breath. 11/24/18  Yes Kozlow, Alvira Philips, MD  albuterol (VENTOLIN HFA) 108 (90 Base) MCG/ACT inhaler INHALE 2 PUFFS INTO THE LUNGS EVERY 4 (FOUR) HOURS AS NEEDED FOR WHEEZING OR SHORTNESS OF BREATH. 01/19/19  Yes Kozlow, Alvira Philips, MD  Cholecalciferol (VITAMIN D PO) Take 1 tablet by mouth See admin instructions. Take one tablet by mouth daily on Monday thru Friday (skip Saturday and Sunday)   Yes [provider]  clobetasol cream (TEMOVATE) 0.05 % Apply 1 application topically at bedtime as needed (itching on left nipple).   Yes [provider]  diclofenac sodium (VOLTAREN) 1 % GEL Apply 2 g topically 4 (four) times daily. Patient taking differently: Apply 1 application topically 2 (two) times daily as needed (arthritis pain).  04/23/16  Yes Valeria Batman, MD  esomeprazole (NEXIUM) 40 MG capsule TAKE 1 CAPSULE (40 MG TOTAL) BY MOUTH DAILY AS NEEDED (ACID REFLUX/COUGH). 12/04/18  Yes Kozlow, Alvira Philips, MD  famotidine (PEPCID) 40 MG tablet TAKE 1 TABLET BY MOUTH EVERY DAY 12/25/18  Yes Kozlow, Alvira Philips, MD  fluticasone (FLONASE) 50 MCG/ACT nasal spray Place 1 spray into both nostrils daily. 06/10/18  Yes Kozlow, Alvira Philips, MD  fluticasone (FLOVENT HFA) 110 MCG/ACT inhaler Inhale 2 puffs into the lungs 2 (two) times daily as needed (cough). Patient taking differently: Inhale 2 puffs into the lungs 2 (two) times daily.  12/23/18  Yes Kozlow, Alvira Philips, MD  gelatin 650 MG capsule Take  650 mg by mouth See admin instructions. Mon - Fri   Yes [provider]  ipratropium-albuterol (DUONEB) 0.5-2.5 (3) MG/3ML SOLN Take 3 mLs by nebulization every 4 (four) hours as needed. Patient taking differently: Take 3 mLs by nebulization every 4 (four) hours as needed (shortness of breath).  08/31/16  Yes Padgett, Pilar Grammes, MD  loratadine (CLARITIN) 10 MG tablet Take 10 mg by mouth daily.    Yes  [provider]  losartan (COZAAR) 100 MG tablet Take 100 mg by mouth daily. 05/21/17  Yes [provider]  montelukast (SINGULAIR) 10 MG tablet Take 1 tablet (10 mg total) by mouth at bedtime as needed (cough). Patient taking differently: Take 10 mg by mouth at bedtime.  12/23/18  Yes Kozlow, Alvira Philips, MD  Potassium 99 MG TABS Take 1 tablet by mouth at bedtime.    Yes [provider]  traZODone (DESYREL) 50 MG tablet Take 25 mg by mouth at bedtime.   Yes [provider]  triamterene-hydrochlorothiazide (MAXZIDE) 75-50 MG tablet Take 1 tablet by mouth daily.   Yes [provider]  valACYclovir (VALTREX) 500 MG tablet Take 500 mg by mouth See admin instructions. Take one tablet (500 mg) by mouth daily at bedtime, take four tablets (2000 mg) once as one dose as needed at onset of fever blister   Yes [provider]     Family History  Problem Relation Age of Onset  . Heart disease Father   . Heart attack Father   . Stroke Father   . Allergies Sister   . Eczema Sister   . Asthma Sister   . Hypertension Sister   . Hypertension Other        x 4 siblings  . Heart attack Brother   . Hypertension Brother   . Heart attack Paternal Grandfather   . Hypertension Sister     Social History   Socioeconomic History  . Marital status: Married    Spouse name: Not on file  . Number of children: 2  . Years of education: Not on file  . Highest education level: Not on file  Occupational History  . Occupation: judge  Social Needs  . Financial resource strain: Not on file  . Food insecurity    Worry: Not on file    Inability: Not on file  . Transportation needs    Medical: Not on file    Non-medical: Not on file  Tobacco Use  . Smoking status: Never Smoker  . Smokeless tobacco: Never Used  Substance and Sexual Activity  . Alcohol use: Yes    Alcohol/week: 0.0 standard drinks    Comment: glass a wine a day  . Drug use: No  . Sexual activity:  Yes  Lifestyle  . Physical activity    Days per week: Not on file    Minutes per session: Not on file  . Stress: Not on file  Relationships  . Social Musician on phone: Not on file    Gets together: Not on file    Attends religious service: Not on file    Active member of club or organization: Not on file    Attends meetings of clubs or organizations: Not on file    Relationship status: Not on file  Other Topics Concern  . Not on file  Social History Narrative  . Not on file     Review of Systems: A 12 point ROS discussed and pertinent positives are  indicated in the HPI above.  All other systems are negative.  Review of Systems  Constitutional: Negative for fatigue and fever.  HENT: Positive for tinnitus.   Respiratory: Negative for cough and shortness of breath.   Cardiovascular: Negative for chest pain.  Gastrointestinal: Negative for abdominal pain.  Musculoskeletal: Negative for back pain.  Neurological: Negative for numbness and headaches.  Psychiatric/Behavioral: Negative for behavioral problems and confusion.    Vital Signs: BP 126/90   Pulse 71   Temp 97.7 F (36.5 C) (Oral)   Resp 16   Ht 5\' 4"  (1.626 m)   Wt 185 lb (83.9 kg)   SpO2 98%   BMI 31.76 kg/m   Physical Exam Vitals signs and nursing note reviewed.  Constitutional:      General: She is not in acute distress.    Appearance: Normal appearance.  HENT:     Mouth/Throat:     Mouth: Mucous membranes are moist.     Pharynx: Oropharynx is clear.  Cardiovascular:     Rate and Rhythm: Normal rate and regular rhythm.     Heart sounds: No murmur.  Pulmonary:     Effort: Pulmonary effort is normal. No respiratory distress.     Breath sounds: Normal breath sounds.  Abdominal:     General: Abdomen is flat.     Palpations: Abdomen is soft.  Skin:    General: Skin is warm and dry.  Neurological:     General: No focal deficit present.     Mental Status: She is alert and oriented to  person, place, and time. Mental status is at baseline.  Psychiatric:        Mood and Affect: Mood normal.        Behavior: Behavior normal.        Thought Content: Thought content normal.        Judgment: Judgment normal.      MD Evaluation Airway: WNL Heart: WNL Abdomen: WNL Chest/ Lungs: WNL ASA  Classification: 3 Mallampati/Airway Score: Two   Imaging: No results found.  Labs:  CBC: Recent Labs    04/24/19 0710  WBC 5.4  HGB 13.7  HCT 40.6  PLT 280    COAGS: Recent Labs    04/24/19 0710  INR 0.9    BMP: Recent Labs    04/28/18 0931 04/24/19 0710  NA 138 140  K 4.0 3.5  CL 96 103  CO2 24 28  GLUCOSE 89 106*  BUN 12 15  CALCIUM 10.0 9.3  CREATININE 0.93 1.17*  GFRNONAA 67 50*  GFRAA 77 58*    LIVER FUNCTION TESTS: No results for input(s): BILITOT, AST, ALT, ALKPHOS, PROT, ALBUMIN in the last 8760 hours.  TUMOR MARKERS: No results for input(s): AFPTM, CEA, CA199, CHROMGRNA in the last 8760 hours.  Assessment and Plan: Patient with past medical history of HTN presents with complaint of worsening tinnitus.  IR consulted for angiogram at the request of Dr. Dorma RussellKraus. Case reviewed by Dr. Corliss Skainseveshwar who approves patient for procedure and has discussed with her in consultation.  Patient presents today in their usual state of health.  She has been NPO and is not currently on blood thinners.   Risks and benefits were discussed with the patient including, but not limited to bleeding, infection, vascular injury or contrast induced renal failure.  This interventional procedure involves the use of X-rays and because of the nature of the planned procedure, it is possible that we will have prolonged use of X-ray  fluoroscopy.  Potential radiation risks to you include (but are not limited to) the following: - A slightly elevated risk for cancer  several years later in life. This risk is typically less than 0.5% percent. This risk is low in comparison to the  normal incidence of human cancer, which is 33% for women and 50% for men according to the [place]. - Radiation induced injury can include skin redness, resembling a rash, tissue breakdown / ulcers and hair loss (which can be temporary or permanent).   The likelihood of either of these occurring depends on the difficulty of the procedure and whether you are sensitive to radiation due to previous procedures, disease, or genetic conditions.   IF your procedure requires a prolonged use of radiation, you will be notified and given written instructions for further action.  It is your responsibility to monitor the irradiated area for the 2 weeks following the procedure and to notify your physician if you are concerned that you have suffered a radiation induced injury.    All of the patient's questions were answered, patient is agreeable to proceed.  Consent signed and in chart.  Thank you for this interesting consult.  I greatly enjoyed meeting MOLLEIGH HUOT and look forward to participating in their care.  A copy of this report was sent to the requesting provider on this date.  Electronically Signed: Docia Barrier, PA 04/24/2019, 8:25 AM   I spent a total of   15 Minutes in face to face in clinical consultation, greater than 50% of which was counseling/coordinating care for pulsatile tinnitus.

## 2019-04-24 NOTE — Discharge Instructions (Signed)
° °Femoral Site Care °This sheet gives you information about how to care for yourself after your procedure. Your health care provider may also give you more specific instructions. If you have problems or questions, contact your health care provider. °What can I expect after the procedure? °After the procedure, it is common to have: °· Bruising that usually fades within 1-2 weeks. °· Tenderness at the site. °Follow these instructions at home: °Wound care °· Follow instructions from your health care provider about how to take care of your insertion site. Make sure you: °? Wash your hands with soap and water before you change your bandage (dressing). If soap and water are not available, use hand sanitizer. °? Change your dressing as told by your health care provider. °? Leave stitches (sutures), skin glue, or adhesive strips in place. These skin closures may need to stay in place for 2 weeks or longer. If adhesive strip edges start to loosen and curl up, you may trim the loose edges. Do not remove adhesive strips completely unless your health care provider tells you to do that. °· Do not take baths, swim, or use a hot tub until your health care provider approves. °· You may shower 24-48 hours after the procedure or as told by your health care provider. °? Gently wash the site with plain soap and water. °? Pat the area dry with a clean towel. °? Do not rub the site. This may cause bleeding. °· Do not apply powder or lotion to the site. Keep the site clean and dry. °· Check your femoral site every day for signs of infection. Check for: °? Redness, swelling, or pain. °? Fluid or blood. °? Warmth. °? Pus or a bad smell. °Activity °· For the first 2-3 days after your procedure, or as long as directed: °? Avoid climbing stairs as much as possible. °? Do not squat. °· Do not lift anything that is heavier than 10 lb (4.5 kg), or the limit that you are told, until your health care provider says that it is safe. °· Rest as  directed. °? Avoid sitting for a long time without moving. Get up to take short walks every 1-2 hours. °· Do not drive for 24 hours if you were given a medicine to help you relax (sedative). °General instructions °· Take over-the-counter and prescription medicines only as told by your health care provider. °· Keep all follow-up visits as told by your health care provider. This is important. °Contact a health care provider if you have: °· A fever or chills. °· You have redness, swelling, or pain around your insertion site. °Get help right away if: °· The catheter insertion area swells very fast. °· You pass out. °· You suddenly start to sweat or your skin gets clammy. °· The catheter insertion area is bleeding, and the bleeding does not stop when you hold steady pressure on the area. °· The area near or just beyond the catheter insertion site becomes pale, cool, tingly, or numb. °These symptoms may represent a serious problem that is an emergency. Do not wait to see if the symptoms will go away. Get medical help right away. Call your local emergency services (911 in the [place] Do not drive yourself to the hospital. °Summary °· After the procedure, it is common to have bruising that usually fades within 1-2 weeks. °· Check your femoral site every day for signs of infection. °· Do not lift anything that is heavier than 10 lb (4.5 kg), or   the limit that you are told, until your health care provider says that it is safe. °This information is not intended to replace advice given to you by your health care provider. Make sure you discuss any questions you have with your health care provider. °Document Released: 01/08/2014 Document Revised: 05/20/2017 Document Reviewed: 05/20/2017 °[employer] Patient Education © 2020 [employer] ° ° ° °Radial Site Care ° °This sheet gives you information about how to care for yourself after your procedure. Your health care provider may also give you more specific instructions. If you have  problems or questions, contact your health care provider. °What can I expect after the procedure? °After the procedure, it is common to have: °· Bruising and tenderness at the catheter insertion area. °Follow these instructions at home: °Medicines °· Take over-the-counter and prescription medicines only as told by your health care provider. °Insertion site care °· Follow instructions from your health care provider about how to take care of your insertion site. Make sure you: °? Wash your hands with soap and water before you change your bandage (dressing). If soap and water are not available, use hand sanitizer. °? Change your dressing as told by your health care provider. °? Leave stitches (sutures), skin glue, or adhesive strips in place. These skin closures may need to stay in place for 2 weeks or longer. If adhesive strip edges start to loosen and curl up, you may trim the loose edges. Do not remove adhesive strips completely unless your health care provider tells you to do that. °· Check your insertion site every day for signs of infection. Check for: °? Redness, swelling, or pain. °? Fluid or blood. °? Pus or a bad smell. °? Warmth. °· Do not take baths, swim, or use a hot tub until your health care provider approves. °· You may shower 24-48 hours after the procedure, or as directed by your health care provider. °? Remove the dressing and gently wash the site with plain soap and water. °? Pat the area dry with a clean towel. °? Do not rub the site. That could cause bleeding. °· Do not apply powder or lotion to the site. °Activity ° °· For 24 hours after the procedure, or as directed by your health care provider: °? Do not flex or bend the affected arm. °? Do not push or pull heavy objects with the affected arm. °? Do not drive yourself home from the hospital or clinic. You may drive 24 hours after the procedure unless your health care provider tells you not to. °? Do not operate machinery or power tools. °· Do  not lift anything that is heavier than 10 lb (4.5 kg), or the limit that you are told, until your health care provider says that it is safe. °· Ask your health care provider when it is okay to: °? Return to work or school. °? Resume usual physical activities or sports. °? Resume sexual activity. °General instructions °· If the catheter site starts to bleed, raise your arm and put firm pressure on the site. If the bleeding does not stop, get help right away. This is a medical emergency. °· If you went home on the same day as your procedure, a responsible adult should be with you for the first 24 hours after you arrive home. °· Keep all follow-up visits as told by your health care provider. This is important. °Contact a health care provider if: °· You have a fever. °· You have redness, swelling, or yellow   drainage around your insertion site. °Get help right away if: °· You have unusual pain at the radial site. °· The catheter insertion area swells very fast. °· The insertion area is bleeding, and the bleeding does not stop when you hold steady pressure on the area. °· Your arm or hand becomes pale, cool, tingly, or numb. °These symptoms may represent a serious problem that is an emergency. Do not wait to see if the symptoms will go away. Get medical help right away. Call your local emergency services (911 in the [place] Do not drive yourself to the hospital. °Summary °· After the procedure, it is common to have bruising and tenderness at the site. °· Follow instructions from your health care provider about how to take care of your radial site wound. Check the wound every day for signs of infection. °· Do not lift anything that is heavier than 10 lb (4.5 kg), or the limit that you are told, until your health care provider says that it is safe. °This information is not intended to replace advice given to you by your health care provider. Make sure you discuss any questions you have with your health care  provider. °Document Released: 06/09/2010 Document Revised: 06/12/2017 Document Reviewed: 06/12/2017 °[employer] Patient Education © 2020 [employer] ° ° ° °Cerebral Angiogram ° °A cerebral angiogram is a procedure that is used to examine the blood vessels in the brain and neck. In this procedure, contrast dye is injected through a long, thin tube (catheter) into an artery. X-rays are then taken, which show if there is a blockage or problem in a blood vessel. °Tell a health care provider about: °· Any allergies you have, including allergies to shellfish, contrast dye, or iodine °· All medicines you are taking, including vitamins, herbs, eye drops, creams, and over-the-counter medicines. °· Any problems you or family members have had with anesthetic medicines. °· Any blood disorders you have. °· Any surgeries you have had. °· Any medical conditions you have. °· Whether you are pregnant or may be pregnant. °· Whether you are currently breastfeeding. °What are the risks? °Generally, this is a safe procedure. However, problems may occur, including: °· Damage to surrounding nerves, tissues, or structures. °· Blood clot. °· Inability to remember what happened (amnesia). This is usually temporary. °· Weakness, numbness, speech, or vision problems. This is usually temporary. °· Stroke. °· Kidney injury. °· Bleeding or bruising. °· Allergic reaction medicines or dyes. °· Infection. °What happens before the procedure? °Staying hydrated °Follow instructions from your health care provider about hydration, which may include: °· Up to 2 hours before the procedure - you may continue to drink clear liquids, such as water, clear fruit juice, black coffee, and plain tea. °Eating and drinking restrictions °Follow instructions from your health care provider about eating and drinking, which may include: °· 8 hours before the procedure - stop eating heavy meals or foods such as meat, fried foods, or fatty foods. °· 6 hours before the  procedure - stop eating light meals or foods, such as toast or cereal. °· 6 hours before the procedure - stop drinking milk or drinks that contain milk. °· 2 hours before the procedure - stop drinking clear liquids. °General instructions °· Ask your health care provider about: °? Changing or stopping your regular medicines. This is especially important if you are taking diabetes medicines or blood thinners. °? Taking medicines such as aspirin or ibuprofen. These medicines can thin your blood. Do not take these medicines   before your procedure if your health care provider asks you not to. °· You may have blood tests done. °· Plan to have someone take you home from the hospital or clinic. °· If you will be going home right after the procedure, plan to have someone with you for 24 hours. °What happens during the procedure? °· To reduce your risk of infection: °? Your health care team will wash or sanitize their hands. °? Your skin will be washed with soap. °? Hair may be removed from the surgical area. °· You will lie on your back on an imaging bed with an X-ray machine around you. °· Your head will be secured to the bed with a strap or device to help you keep still. °· An IV tube will be inserted into one of your veins. °· You will be given one or more of the following: °? A medicine to help you relax (sedative). °? A medicine to numb the area (local anesthetic) where the catheter will be inserted. This is usually your groin, leg, or arm. °· Your heart rate and other vital signs will be watched carefully. You may have electrodes placed on your chest. °· A small cut (incision) will be made where the catheter will be inserted. °· The catheter will be inserted into an artery that leads to the head. You may feel slight pressure. °· The catheter will be moved through the body up to your neck and brain. X-ray images will help your health care provider bring the catheter to the correct location. °· The dye will be injected  into the catheter and will travel to your head or neck area. You may feel a warming or burning sensation or notice a strange taste in your mouth as the dye goes through your system. °· Images will be taken to show how the dye flows through the area. °· While the images are being taken, you may be given instructions on breathing, swallowing, moving, or talking. °· When the images are finished, the catheter will be slowly removed. °· Pressure will be applied to the skin to stop any bleeding. A tight bandage (dressing) or seal will be applied to the skin. °· Your IV will be removed. °The procedure may vary among health care providers and hospitals. °What happens after the procedure? °· Your blood pressure, heart rate, breathing rate, and blood oxygen level will be monitored until the medicines you were given have worn off. °· You will be asked to lie flat for several hours. The arm or leg where the catheter was inserted will need to be kept straight while you are in the recovery room. °· The insertion site will be watched for bleeding and you will be checked often. °· You will be instructed to drink plenty of fluids. This will help wash the contrast dye out of your system. °· Do not drive for 24 hours if you received a sedative. °· It is up to you to get the results of your procedure. Ask your health care provider, or the department that is doing the procedure, when your results will be ready. °Summary °· A cerebral angiogram is a procedure that is used to examine the blood vessels in the brain and neck. °· In this procedure, contrast dye is injected through a long, thin tube (catheter) into an artery. X-rays are then taken, which show if there is a blockage or problem in a blood vessel. °· You will be given a sedative to help you relax during   the procedure. A local anesthetic will be used to numb the area where the catheter is inserted. You may feel pressure when the catheter is inserted, and you may feel a warm  sensation when the dye is injected. °· After the procedure, you will be asked to lie flat for several hours. The arm or leg where the catheter was inserted will need to be kept straight while you are in the recovery room. °This information is not intended to replace advice given to you by your health care provider. Make sure you discuss any questions you have with your health care provider. °Document Released: 09/21/2013 Document Revised: 04/19/2017 Document Reviewed: 06/11/2016 °[employer] Patient Education © 2020 [employer] ° ° ° °Moderate Conscious Sedation, Adult, Care After °These instructions provide you with information about caring for yourself after your procedure. Your health care provider may also give you more specific instructions. Your treatment has been planned according to current medical practices, but problems sometimes occur. Call your health care provider if you have any problems or questions after your procedure. °What can I expect after the procedure? °After your procedure, it is common: °· To feel sleepy for several hours. °· To feel clumsy and have poor balance for several hours. °· To have poor judgment for several hours. °· To vomit if you eat too soon. °Follow these instructions at home: °For at least 24 hours after the procedure: ° °· Do not: °? Participate in activities where you could fall or become injured. °? Drive. °? Use heavy machinery. °? Drink alcohol. °? Take sleeping pills or medicines that cause drowsiness. °? Make important decisions or sign legal documents. °? Take care of children on your own. °· Rest. °Eating and drinking °· Follow the diet recommended by your health care provider. °· If you vomit: °? Drink water, juice, or soup when you can drink without vomiting. °? Make sure you have little or no nausea before eating solid foods. °General instructions °· Have a responsible adult stay with you until you are awake and alert. °· Take over-the-counter and prescription  medicines only as told by your health care provider. °· If you smoke, do not smoke without supervision. °· Keep all follow-up visits as told by your health care provider. This is important. °Contact a health care provider if: °· You keep feeling nauseous or you keep vomiting. °· You feel light-headed. °· You develop a rash. °· You have a fever. °Get help right away if: °· You have trouble breathing. °This information is not intended to replace advice given to you by your health care provider. Make sure you discuss any questions you have with your health care provider. °Document Released: 02/25/2013 Document Revised: 04/19/2017 Document Reviewed: 08/27/2015 °[employer] Patient Education © 2020 [employer] ° °

## 2019-04-26 NOTE — Progress Notes (Signed)
Cardiology Office Note:    Date:  04/27/2019   ID:  Brenda Daniel, DOB 1955-05-29, MRN [id number]  PCP:  Brenda MontanaWhite, Cynthia, MD  Cardiologist:  No primary care provider on file.  Electrophysiologist:  None   Referring MD: Brenda MontanaWhite, Cynthia, MD   Chief Complaint: f/u chest pain  History of Present Illness:    Brenda Daniel is a 63 y.o. female with a hx of hypertension, hyperlipidemia, prediabetes, GERD. She works as a Education administratorjudge.  I saw the patient last in clinic on 03/25/2018, at which time she saw me for chest tightness. CCTA was unremarkable for obstructive CAD.   Continues to have chest pressure. Felt to be atypical for asthma per allergy, however it seems to occur with warmer weather and disappeared when weather cooled off. Occurs several times a day at times 4-5 x a week, 2-3 hours at a time. Inhalers don't seem to help. Has not tried nitro.  Triamterene hctz for blood pressure management, bp good today. Whooshing in ears, dizziness started 2 weeks ago. Monday before thanksgiving had to lie down from dizziness. Daily symptoms, 2-3 x a day. Dizziness lasts seconds at a time. She tells me she was told that the results of her recent 4 vessel cerebral arteriogram could be source of dizziness.   She had a right common femoral approach for her angiogram and has had back pain since then. Denies any bleeding at groin site.   The patient denies dyspnea at rest or with exertion, palpitations, PND, orthopnea, or leg swelling. Denies syncope or presyncope. Endorses snoring and has not been evaluated for sleep apnea.  Past Medical History:  Diagnosis Date  . Allergic rhinitis    per Dr. Lucie LeatherKozlow  . Asthma    per Dr. Lucie LeatherKozlow  . DJD (degenerative joint disease)   . GERD (gastroesophageal reflux disease)   . Hyperlipidemia   . Hypertension     Past Surgical History:  Procedure Laterality Date  . APPENDECTOMY    . BRAVO PH STUDY N/A 03/16/2015   Procedure: BRAVO PH STUDY;  Surgeon: Charlott RakesVincent  Schooler, MD;  Location: [facility];  Service: Endoscopy;  Laterality: N/A;  . CESAREAN SECTION    . ESOPHAGOGASTRODUODENOSCOPY N/A 03/16/2015   Procedure: ESOPHAGOGASTRODUODENOSCOPY (EGD);  Surgeon: Charlott RakesVincent Schooler, MD;  [facility];  Service: Endoscopy;  Laterality: N/A;  . MYOMECTOMY    . TEMPOROMANDIBULAR JOINT SURGERY Right   . TONSILLECTOMY      Current Medications: Current Meds  Medication Sig  . albuterol (PROVENTIL) (2.5 MG/3ML) 0.083% nebulizer solution Take 3 mLs (2.5 mg total) by nebulization every 6 (six) hours as needed for wheezing or shortness of breath.  Marland Kitchen. albuterol (VENTOLIN HFA) 108 (90 Base) MCG/ACT inhaler INHALE 2 PUFFS INTO THE LUNGS EVERY 4 (FOUR) HOURS AS NEEDED FOR WHEEZING OR SHORTNESS OF BREATH.  Marland Kitchen. Cholecalciferol (VITAMIN D PO) Take 1 tablet by mouth See admin instructions. Take one tablet by mouth daily on Monday thru Friday (skip Saturday and Sunday)  . clobetasol cream (TEMOVATE) 0.05 % Apply 1 application topically at bedtime as needed (itching on left nipple).  Marland Kitchen. diclofenac sodium (VOLTAREN) 1 % GEL Apply 2 g topically 4 (four) times daily. (Patient taking differently: Apply 1 application topically 2 (two) times daily as needed (arthritis pain). )  . esomeprazole (NEXIUM) 40 MG capsule TAKE 1 CAPSULE (40 MG TOTAL) BY MOUTH DAILY AS NEEDED (ACID REFLUX/COUGH).  . famotidine (PEPCID) 40 MG tablet TAKE 1 TABLET BY MOUTH EVERY DAY  . fluticasone (  FLONASE) 50 MCG/ACT nasal spray Place 1 spray into both nostrils daily.  . fluticasone (FLOVENT HFA) 110 MCG/ACT inhaler Inhale 2 puffs into the lungs 2 (two) times daily as needed (cough). (Patient taking differently: Inhale 2 puffs into the lungs 2 (two) times daily. )  . gelatin 650 MG capsule Take 650 mg by mouth See admin instructions. Mon - Fri  . ipratropium-albuterol (DUONEB) 0.5-2.5 (3) MG/3ML SOLN Take 3 mLs by nebulization every 4 (four) hours as needed. (Patient taking differently: Take 3 mLs by  nebulization every 4 (four) hours as needed (shortness of breath). )  . loratadine (CLARITIN) 10 MG tablet Take 10 mg by mouth daily.   Marland Kitchen losartan (COZAAR) 100 MG tablet Take 100 mg by mouth daily.  . montelukast (SINGULAIR) 10 MG tablet Take 1 tablet (10 mg total) by mouth at bedtime as needed (cough). (Patient taking differently: Take 10 mg by mouth at bedtime. )  . Potassium 99 MG TABS Take 1 tablet by mouth at bedtime.   . traZODone (DESYREL) 50 MG tablet Take 25 mg by mouth at bedtime.  . triamterene-hydrochlorothiazide (MAXZIDE) 75-50 MG tablet Take 1 tablet by mouth daily.  . valACYclovir (VALTREX) 500 MG tablet Take 500 mg by mouth See admin instructions. Take one tablet (500 mg) by mouth daily at bedtime, take four tablets (2000 mg) once as one dose as needed at onset of fever blister     Allergies:   Advil [ibuprofen], Niacin and related, and Red yeast rice [cholestin]   Social History   Socioeconomic History  . Marital status: Married    Spouse name: Not on file  . Number of children: 2  . Years of education: Not on file  . Highest education level: Not on file  Occupational History  . Occupation: judge  Social Needs  . Financial resource strain: Not on file  . Food insecurity    Worry: Not on file    Inability: Not on file  . Transportation needs    Medical: Not on file    Non-medical: Not on file  Tobacco Use  . Smoking status: Never Smoker  . Smokeless tobacco: Never Used  Substance and Sexual Activity  . Alcohol use: Yes    Alcohol/week: 0.0 standard drinks    Comment: glass a wine a day  . Drug use: No  . Sexual activity: Yes  Lifestyle  . Physical activity    Days per week: Not on file    Minutes per session: Not on file  . Stress: Not on file  Relationships  . Social Musician on phone: Not on file    Gets together: Not on file    Attends religious service: Not on file    Active member of club or organization: Not on file    Attends  meetings of clubs or organizations: Not on file    Relationship status: Not on file  Other Topics Concern  . Not on file  Social History Narrative  . Not on file     Family History: The patient's family history includes Allergies in her sister; Asthma in her sister; Eczema in her sister; Heart attack in her brother, father, and paternal grandfather; Heart disease in her father; Hypertension in her brother, sister, sister, and another family member; Stroke in her father.  ROS:   Please see the history of present illness.    All other systems reviewed and are negative.  EKGs/Labs/Other Studies Reviewed:    The  following studies were reviewed today:  EKG:  NSR, low voltage QRS, nonspecific st/t wave abnormality.  Epworth Sleepiness Scale: 4  Recent Labs: 04/24/2019: BUN 15; Creatinine, Ser 1.17; Hemoglobin 13.7; Platelets 280; Potassium 3.5; Sodium 140  Recent Lipid Panel No results found for: CHOL, TRIG, HDL, CHOLHDL, VLDL, LDLCALC, LDLDIRECT  Physical Exam:    VS:  BP 114/76 (BP Location: Left Arm, Patient Position: Sitting, Cuff Size: Large)   Pulse 80   Temp (!) 97.2 F (36.2 C)   Ht  (1.6 m)   Wt 189 lb (85.7 kg)   BMI 33.48 kg/m     Wt Readings from Last 5 Encounters:  04/27/19 189 lb (85.7 kg)  04/24/19 185 lb (83.9 kg)  03/03/19 180 lb (81.6 kg)  06/10/18 182 lb (82.6 kg)  03/25/18 186 lb 6.4 oz (84.6 kg)     Constitutional: No acute distress Eyes: sclera non-icteric, normal conjunctiva and lids ENMT: normal dentition, moist mucous membranes Cardiovascular: regular rhythm, normal rate, no murmurs. S1 and S2 normal. Radial pulses normal bilaterally. No jugular venous distention.  Respiratory: clear to auscultation bilaterally GI : normal bowel sounds, soft and nontender. No distention.   MSK: extremities warm, well perfused. No edema.  NEURO: grossly nonfocal exam, moves all extremities. PSYCH: alert and oriented x 3, normal mood and affect.    ASSESSMENT:    1. Dizziness   2. Postoperative back pain   3. Essential hypertension   4. Chest tightness   5. Hyperlipidemia, unspecified hyperlipidemia type   6. Asthma    PLAN:    Dizziness -she tells me that she was informed by the interventional radiologist that the findings from her recent cerebral arteriogram could be the source of her dizziness.  Findings suggest Appro14.91mmx 11.35mm high riding  RT jugular bulb prominance,and a high riding  13mm x 11mm Lt jugular prominence.  Because we cannot entirely exclude a cardiac source of her dizziness, I would like to pursue an echocardiogram and echo and cardiac monitor to assess for structural or rhythm disturbance as a source.  Back pain after femoral access-I am concerned about the patient's report of back pain after undergoing a cerebral arteriogram from right common femoral approach.  I would like to exclude retroperitoneal hematoma or other bleeding.  I will obtain a CT angiogram of the abdomen and pelvis to further evaluate.  She continues to have chest pain, but has no obstructive coronary artery disease.  I question whether this is related to asthma versus endothelial dysfunction.  We will provide the patient with a prescription for sublingual nitroglycerin and she can try this the next time she has chest discomfort.  She tells me she will likely not have chest discomfort until the summer when she feels the heat and humidity affects her symptoms more.  TIME SPENT WITH PATIENT: 25 minutes of direct patient care. More than 50% of that time was spent on coordination of care and counseling regarding work-up of dizziness, back pain after vascular access, and chest pain.  Weston Brass, MD   [facility]   Medication Adjustments/Labs and Tests Ordered: Current medicines are reviewed at length with the patient today.  Concerns regarding medicines are outlined above.  Orders Placed This Encounter  Procedures  . CT Angio  Abd/Pel w/ and/or w/o  . Basic metabolic panel  . Cardiac event monitor  . EKG 12-Lead  . ECHOCARDIOGRAM COMPLETE   Meds ordered this encounter  Medications  . nitroGLYCERIN (NITROSTAT) 0.4 MG SL  tablet    Sig: Place 1 tablet (0.4 mg total) under the tongue every 5 (five) minutes as needed for chest pain.    Dispense:  90 tablet    Refill:  3    Patient Instructions  Medication Instructions:  Use your NTG under your tongue for recurrent chest pain. May take one tablet every 5 minutes. If you are still having discomfort after 3 tablets in 15 minutes, call 911.  *If you need a refill on your cardiac medications before your next appointment, please call your pharmacy*  Lab Work: BMET TODAY   If you have labs (blood work) drawn today and your tests are completely normal, you will receive your results only by: Marland Kitchen MyChart Message (if you have MyChart) OR . A paper copy in the mail If you have any lab test that is abnormal or we need to change your treatment, we will call you to review the results.  Testing/Procedures: Non-Cardiac CT Angiography (CTA), is a special type of CT scan that uses a computer to produce multi-dimensional views of major blood vessels throughout the body. In CT angiography, a contrast material is injected through an IV to help visualize the blood vessels  Your physician has requested that you have an echocardiogram. Echocardiography is a painless test that uses sound waves to create images of your heart. It provides your doctor with information about the size and shape of your heart and how well your heart's chambers and valves are working. This procedure takes approximately one hour. There are no restrictions for this procedure. AFTER January 1   Your physician has recommended that you wear an event monitor. Event monitors are medical devices that record the heart's electrical activity. Doctors most often [place] these monitors to diagnose arrhythmias. Arrhythmias are  problems with the speed or rhythm of the heartbeat. The monitor is a small, portable device. You can wear one while you do your normal daily activities. This is usually used to diagnose what is causing palpitations/syncope (passing out). THIS WILL BE MAILED TO YOU    Follow-Up: At [facility], you and your health needs are our priority.  As part of our continuing mission to provide you with exceptional heart care, we have created designated Provider Care Teams.  These Care Teams include your primary Cardiologist (physician) and Advanced Practice Providers (APPs -  Physician Assistants and Nurse Practitioners) who all work together to provide you with the care you need, when you need it.  Your next appointment:   6 month(s) You will receive a reminder letter in the mail two months in advance. If you don't receive a letter, please call our office to schedule the follow-up appointment.   The format for your next appointment:   In Person  Provider:   You may see DR Margaretann Loveless  or one of the following Advanced Practice Providers on your designated Care Team:    Rosaria Ferries, PA-C  Jory Sims, DNP, ANP  Cadence Kathlen Mody, NP

## 2019-04-27 ENCOUNTER — Encounter: Payer: Self-pay | Admitting: Internal Medicine

## 2019-04-27 ENCOUNTER — Other Ambulatory Visit: Payer: Self-pay

## 2019-04-27 ENCOUNTER — Ambulatory Visit (INDEPENDENT_AMBULATORY_CARE_PROVIDER_SITE_OTHER): Payer: No Typology Code available for payment source | Admitting: Internal Medicine

## 2019-04-27 ENCOUNTER — Ambulatory Visit: Payer: No Typology Code available for payment source | Admitting: Internal Medicine

## 2019-04-27 ENCOUNTER — Telehealth: Payer: Self-pay | Admitting: *Deleted

## 2019-04-27 VITALS — BP 114/76 | HR 80 | Temp 97.2°F | Ht 63.0 in | Wt 189.0 lb

## 2019-04-27 DIAGNOSIS — R0789 Other chest pain: Secondary | ICD-10-CM

## 2019-04-27 DIAGNOSIS — I10 Essential (primary) hypertension: Secondary | ICD-10-CM | POA: Diagnosis not present

## 2019-04-27 DIAGNOSIS — R42 Dizziness and giddiness: Secondary | ICD-10-CM | POA: Diagnosis not present

## 2019-04-27 DIAGNOSIS — E785 Hyperlipidemia, unspecified: Secondary | ICD-10-CM

## 2019-04-27 DIAGNOSIS — G8918 Other acute postprocedural pain: Secondary | ICD-10-CM | POA: Diagnosis not present

## 2019-04-27 DIAGNOSIS — J45909 Unspecified asthma, uncomplicated: Secondary | ICD-10-CM

## 2019-04-27 LAB — BASIC METABOLIC PANEL
BUN/Creatinine Ratio: 17 (ref 12–28)
BUN: 15 mg/dL (ref 8–27)
CO2: 25 mmol/L (ref 20–29)
Calcium: 10 mg/dL (ref 8.7–10.3)
Chloride: 97 mmol/L (ref 96–106)
Creatinine, Ser: 0.9 mg/dL (ref 0.57–1.00)
GFR calc Af Amer: 79 mL/min/{1.73_m2} (ref >59)
GFR calc non Af Amer: 69 mL/min/{1.73_m2} (ref >59)
Glucose: 86 mg/dL (ref 65–99)
Potassium: 4.4 mmol/L (ref 3.5–5.2)
Sodium: 139 mmol/L (ref 134–144)

## 2019-04-27 MED ORDER — NITROGLYCERIN 0.4 MG SL SUBL: 0.4000 mg | SUBLINGUAL_TABLET | SUBLINGUAL | 3 refills | Status: DC | PRN

## 2019-04-27 NOTE — Patient Instructions (Addendum)
Medication Instructions:  Use your NTG under your tongue for recurrent chest pain. May take one tablet every 5 minutes. If you are still having discomfort after 3 tablets in 15 minutes, call 911.  *If you need a refill on your cardiac medications before your next appointment, please call your pharmacy*  Lab Work: BMET TODAY   If you have labs (blood work) drawn today and your tests are completely normal, you will receive your results only by: Marland Kitchen MyChart Message (if you have MyChart) OR . A paper copy in the mail If you have any lab test that is abnormal or we need to change your treatment, we will call you to review the results.  Testing/Procedures: Non-Cardiac CT Angiography (CTA), is a special type of CT scan that uses a computer to produce multi-dimensional views of major blood vessels throughout the body. In CT angiography, a contrast material is injected through an IV to help visualize the blood vessels  Your physician has requested that you have an echocardiogram. Echocardiography is a painless test that uses sound waves to create images of your heart. It provides your doctor with information about the size and shape of your heart and how well your heart's chambers and valves are working. This procedure takes approximately one hour. There are no restrictions for this procedure. AFTER January 1   Your physician has recommended that you wear an event monitor. Event monitors are medical devices that record the heart's electrical activity. Doctors most often [place] these monitors to diagnose arrhythmias. Arrhythmias are problems with the speed or rhythm of the heartbeat. The monitor is a small, portable device. You can wear one while you do your normal daily activities. This is usually used to diagnose what is causing palpitations/syncope (passing out). THIS WILL BE MAILED TO YOU    Follow-Up: At [facility], you and your health needs are our priority.  As part of our continuing mission to  provide you with exceptional heart care, we have created designated Provider Care Teams.  These Care Teams include your primary Cardiologist (physician) and Advanced Practice Providers (APPs -  Physician Assistants and Nurse Practitioners) who all work together to provide you with the care you need, when you need it.  Your next appointment:   6 month(s) You will receive a reminder letter in the mail two months in advance. If you don't receive a letter, please call our office to schedule the follow-up appointment.   The format for your next appointment:   In Person  Provider:   You may see DR Margaretann Loveless  or one of the following Advanced Practice Providers on your designated Care Team:    Rosaria Ferries, PA-C  Jory Sims, DNP, ANP  Cadence Kathlen Mody, NP

## 2019-04-27 NOTE — Telephone Encounter (Signed)
[employer] to ship a 30 day cardiac event monitor to the patients home. 

## 2019-04-28 ENCOUNTER — Ambulatory Visit (INDEPENDENT_AMBULATORY_CARE_PROVIDER_SITE_OTHER)
Admission: RE | Admit: 2019-04-28 | Discharge: 2019-04-28 | Disposition: A | Discharge status: Home / Self Care | Payer: No Typology Code available for payment source | Source: Ambulatory Visit | Attending: Internal Medicine | Admitting: Internal Medicine

## 2019-04-28 ENCOUNTER — Telehealth (HOSPITAL_COMMUNITY): Payer: Self-pay

## 2019-04-28 ENCOUNTER — Telehealth: Payer: Self-pay | Admitting: *Deleted

## 2019-04-28 ENCOUNTER — Encounter (HOSPITAL_COMMUNITY): Payer: Self-pay

## 2019-04-28 ENCOUNTER — Other Ambulatory Visit (HOSPITAL_COMMUNITY): Payer: Self-pay | Admitting: Interventional Radiology

## 2019-04-28 ENCOUNTER — Telehealth: Payer: Self-pay | Admitting: Student

## 2019-04-28 DIAGNOSIS — R42 Dizziness and giddiness: Secondary | ICD-10-CM | POA: Diagnosis not present

## 2019-04-28 DIAGNOSIS — G8918 Other acute postprocedural pain: Secondary | ICD-10-CM | POA: Diagnosis not present

## 2019-04-28 DIAGNOSIS — H9313 Tinnitus, bilateral: Secondary | ICD-10-CM

## 2019-04-28 NOTE — Telephone Encounter (Signed)
Called to schedule ct temporal bone, no answer, left vm. AW  

## 2019-04-28 NOTE — Telephone Encounter (Signed)
-----   Message from Elouise Munroe, MD sent at 04/28/2019 10:17 AM EST ----- Normal BMP.

## 2019-04-28 NOTE — Telephone Encounter (Signed)
NIR.  Patient underwent an image-guided diagnostic cerebral arteriogram via right femoral approach 04/24/2019 by Dr. Estanislado Pandy.  Received message from patient's cardiologist, Dr. Margaretann Loveless, stating that she saw patient for cardiology F/U 04/27/2019, and at that time she was complaining of low back pain. Per Dr. Margaretann Loveless note 04/27/2019: "Back pain after femoral access-I am concerned about the patient's report of back pain after undergoing a cerebral arteriogram from right common femoral approach.  I would like to exclude retroperitoneal hematoma or other bleeding.  I will obtain a CT angiogram of the abdomen and pelvis to further evaluate."  Called patient's cell at [phone], no answer. Called her cell again at [phone] and spoke with patient. Patient states that her low back pain began on Saturday AM 04/25/2019 when she awoke. States she has not had low back pain since 1995. States that she was stiff when she awoke that AM, and thought it was "from laying on the table all of those hours".  States that when she began to move, stiffness turned into pain located in the right lower back. Rates pain as 2-3/10 and describes pain as "uncomfortable" feeling. States pain has improved since Saturday AM. Denies pain radiation. Denies dizziness. States right groin incision c/d/i, no tenderness or signs of infection. States that her cardiologist scheduled her for CTA abdomen/pelvis this afternoon. Informed patient that I will let Dr. Estanislado Pandy know how she is doing, and we will also review her CTA once she gets it done. Patient appreciative of call. All questions answered and concerns addressed.   Bea Graff John Williamsen, PA-C 04/28/2019, 10:50 AM

## 2019-04-28 NOTE — Telephone Encounter (Signed)
Left detailed message on secure voicemail - alos released ot mychart. Any question may call back

## 2019-04-30 ENCOUNTER — Other Ambulatory Visit (HOSPITAL_COMMUNITY): Payer: No Typology Code available for payment source

## 2019-05-02 ENCOUNTER — Ambulatory Visit (INDEPENDENT_AMBULATORY_CARE_PROVIDER_SITE_OTHER): Payer: No Typology Code available for payment source

## 2019-05-02 DIAGNOSIS — R42 Dizziness and giddiness: Secondary | ICD-10-CM | POA: Diagnosis not present

## 2019-05-02 DIAGNOSIS — I10 Essential (primary) hypertension: Secondary | ICD-10-CM

## 2019-05-26 ENCOUNTER — Other Ambulatory Visit (HOSPITAL_COMMUNITY): Payer: No Typology Code available for payment source

## 2019-05-27 ENCOUNTER — Ambulatory Visit (HOSPITAL_COMMUNITY): Payer: No Typology Code available for payment source

## 2019-06-30 ENCOUNTER — Encounter: Payer: Self-pay | Admitting: Allergy and Immunology

## 2019-06-30 ENCOUNTER — Encounter: Payer: Self-pay | Admitting: Orthopaedic Surgery

## 2019-06-30 ENCOUNTER — Ambulatory Visit (INDEPENDENT_AMBULATORY_CARE_PROVIDER_SITE_OTHER): Payer: No Typology Code available for payment source | Admitting: Allergy and Immunology

## 2019-06-30 ENCOUNTER — Other Ambulatory Visit: Payer: Self-pay

## 2019-06-30 VITALS — BP 126/78 | HR 87 | Temp 97.8°F | Resp 16 | Ht 62.0 in | Wt 189.6 lb

## 2019-06-30 DIAGNOSIS — J3089 Other allergic rhinitis: Secondary | ICD-10-CM

## 2019-06-30 DIAGNOSIS — K219 Gastro-esophageal reflux disease without esophagitis: Secondary | ICD-10-CM

## 2019-06-30 DIAGNOSIS — J4540 Moderate persistent asthma, uncomplicated: Secondary | ICD-10-CM | POA: Diagnosis not present

## 2019-06-30 NOTE — Patient Instructions (Addendum)
   1. Continue to treat and prevent inflammation:   A. Flonase 1-2 sprays each nostril one time per day  B. Montelukast 10mg  one time per day  C. Flovent 110 - 2 inhalations 1-2 times per day with spacer  2. If needed:   A. Antihistamine   B. Proair HFA - 2 inhalations or Xopenex neb every 4-6 hours  C. Mucinex DM - 2 tablets two times per day  D. Famotidine 40 mg - 1 tablet 1 time per day  4. "Action Plan" for flare up:   A. Increase Flovent 110 to 3 inhalations 3 times per day  B. Add Nexium 40 mg twice a day if needed  5. Obtain COVID vaccine  6. Return to clinic in 6 months or earlier if problem

## 2019-06-30 NOTE — Progress Notes (Signed)
[place] - [place] - [place] - [place] -    Follow-up Note  Referring Provider: Laurann Montana, MD Primary Provider: Laurann Montana, MD Date of Office Visit: 06/30/2019  Subjective:   Brenda Daniel (DOB: [birth year]) is a 64 y.o. female who [facility] in re-evaluation of the following:  HPI: [name] returns to this clinic in reevaluation of asthma and allergic rhinitis and LPR.  I last saw her in this clinic on 23 December 2018.  At this point she is doing very well and has very little issues with her airway.  She continues to use Flonase and montelukast and Flovent on a consistent basis mostly 1 time per day and rarely uses a short acting bronchodilator and has not had any of her coughing episodes.  She can exert herself by walking up stairs and performing any yoga with no problem.  Overall her throat is really been doing quite well.  She has not been having much problems with reflux.  She is very careful about what she eats and she only utilizes famotidine and Nexium as needed.  There are specific foods that will trigger off her reflux such as [place] fries and she will utilize these antireflux medications preventatively if she knows she will be eating those foods.  She did receive the flu vaccine this year.  She is awaiting the Covid vaccine.  She has had a series of other medical issues that have required evaluation by multiple doctors including her cardiologist and her ENT and has had many different imaging studies performed in investigation of these issues all of which have been nondiagnostic.  Allergies as of 06/30/2019      Reactions   Advil [ibuprofen] Other (See Comments)   Dizzy; any NSAIDS    Niacin And Related Other (See Comments)   dizzy   Red Yeast Rice [cholestin] Other (See Comments)   dizzy      Medication List    albuterol (2.5 MG/3ML) 0.083% nebulizer solution Commonly known as: PROVENTIL Take 3  mLs (2.5 mg total) by nebulization every 6 (six) hours as needed for wheezing or shortness of breath.   albuterol 108 (90 Base) MCG/ACT inhaler Commonly known as: VENTOLIN HFA INHALE 2 PUFFS INTO THE LUNGS EVERY 4 (FOUR) HOURS AS NEEDED FOR WHEEZING OR SHORTNESS OF BREATH.   clobetasol cream 0.05 % Commonly known as: TEMOVATE Apply 1 application topically at bedtime as needed (itching on left nipple).   famotidine 40 MG tablet Commonly known as: PEPCID TAKE 1 TABLET BY MOUTH EVERY DAY   Flovent HFA 110 MCG/ACT inhaler Generic drug: fluticasone Inhale 2 puffs into the lungs 2 (two) times daily as needed (cough).   fluticasone 50 MCG/ACT nasal spray Commonly known as: FLONASE Place 1 spray into both nostrils daily.   gelatin 650 MG capsule Take 650 mg by mouth See admin instructions. Mon - Fri   ipratropium-albuterol 0.5-2.5 (3) MG/3ML Soln Commonly known as: DUONEB Take 3 mLs by nebulization every 4 (four) hours as needed. What changed: reasons to take this   losartan 100 MG tablet Commonly known as: COZAAR Take 100 mg by mouth daily.   montelukast 10 MG tablet Commonly known as: Singulair Take 1 tablet (10 mg total) by mouth at bedtime as needed (cough).   nitroGLYCERIN 0.4 MG SL tablet Commonly known as: NITROSTAT Place 1 tablet (0.4 mg total) under the tongue every 5 (five) minutes as needed for chest pain.   Potassium 99  MG Tabs Take 1 tablet by mouth at bedtime.   traZODone 50 MG tablet Commonly known as: DESYREL Take 25 mg by mouth at bedtime.   triamterene-hydrochlorothiazide 75-50 MG tablet Commonly known as: MAXZIDE Take 1 tablet by mouth daily.   valACYclovir 500 MG tablet Commonly known as: VALTREX Take 500 mg by mouth See admin instructions. Take one tablet (500 mg) by mouth daily at bedtime, take four tablets (2000 mg) once as one dose as needed at onset of fever blister   VITAMIN D PO Take 1 tablet by mouth See admin instructions. Take one tablet  by mouth daily on Monday thru Friday (skip Saturday and Sunday)       Past Medical History:  Diagnosis Date  . Allergic rhinitis    per Dr. Neldon Mc  . Asthma    per Dr. Neldon Mc  . DJD (degenerative joint disease)   . GERD (gastroesophageal reflux disease)   . Hyperlipidemia   . Hypertension     Past Surgical History:  Procedure Laterality Date  . APPENDECTOMY    . BRAVO [place] STUDY N/A 03/16/2015   Procedure: BRAVO [place];  Surgeon: Wilford Corner, MD;  Location: [facility];  Service: Endoscopy;  Laterality: N/A;  . CESAREAN SECTION    . ESOPHAGOGASTRODUODENOSCOPY N/A 03/16/2015   Procedure: ESOPHAGOGASTRODUODENOSCOPY (EGD);  Surgeon: Wilford Corner, MD;  Location: [facility];  Service: Endoscopy;  Laterality: N/A;  . IR ANGIO INTRA EXTRACRAN SEL COM CAROTID INNOMINATE BILAT MOD SED  04/24/2019  . IR ANGIO VERTEBRAL SEL VERTEBRAL BILAT MOD SED  04/24/2019  . IR US GUIDE VASC ACCESS RIGHT  04/24/2019  . MYOMECTOMY    . TEMPOROMANDIBULAR JOINT SURGERY Right   . TONSILLECTOMY      Review of systems negative except as noted in HPI / PMHx or noted below:  Review of Systems  Constitutional: Negative.   HENT: Negative.   Eyes: Negative.   Respiratory: Negative.   Cardiovascular: Negative.   Gastrointestinal: Negative.   Genitourinary: Negative.   Musculoskeletal: Negative.   Skin: Negative.   Neurological: Negative.   Endo/Heme/Allergies: Negative.   Psychiatric/Behavioral: Negative.      Objective:   Vitals:   06/30/19 1657  BP: 126/78  Pulse: 87  Resp: 16  Temp: 97.8 F (36.6 C)  SpO2: 92%   Height: 5\' 2"  (157.5 cm)  Weight: 189 lb 9.6 oz (86 kg)   Physical Exam Constitutional:      Appearance: She is not diaphoretic.  HENT:     Head: Normocephalic.     Right Ear: Tympanic membrane, ear canal and external ear normal.     Left Ear: Tympanic membrane, ear canal and external ear normal.     Nose: Nose normal. No mucosal edema or rhinorrhea.      Mouth/Throat:     Pharynx: Uvula midline. No oropharyngeal exudate.  Eyes:     Conjunctiva/sclera: Conjunctivae normal.  Neck:     Thyroid: No thyromegaly.     Trachea: Trachea normal. No tracheal tenderness or tracheal deviation.  Cardiovascular:     Rate and Rhythm: Normal rate and regular rhythm.     Heart sounds: Normal heart sounds, S1 normal and S2 normal. No murmur.  Pulmonary:     Effort: No respiratory distress.     Breath sounds: Normal breath sounds. No stridor. No wheezing or rales.  Lymphadenopathy:     Head:     Right side of head: No tonsillar adenopathy.     Left side of head:  No tonsillar adenopathy.     Cervical: No cervical adenopathy.  Skin:    Findings: No erythema or rash.     Nails: There is no clubbing.  Neurological:     Mental Status: She is alert.      Diagnostics:    Spirometry was performed and demonstrated an FEV1 of 2.26 at 98 % of predicted.  Assessment and Plan:   1. Asthma, moderate persistent, well-controlled   2. Perennial allergic rhinitis   3. LPRD (laryngopharyngeal reflux disease)     1. Continue to treat and prevent inflammation:   A. Flonase 1-2 sprays each nostril one time per day  B. Montelukast 10mg  one time per day  C. Flovent 110 - 2 inhalations 1-2 times per day with spacer  2. If needed:   A. Antihistamine   B. Proair HFA - 2 inhalations or Xopenex neb every 4-6 hours  C. Mucinex DM - 2 tablets two times per day  D. Famotidine 40 mg - 1 tablet 1 time per day  4. "Action Plan" for flare up:   A. Increase Flovent 110 to 3 inhalations 3 times per day  B. Add Nexium 40 mg twice a day if needed  5. Obtain COVID vaccine  6. Return to clinic in 6 months or earlier if problem  Overall Lynnea appears to be doing very well at this point in time and she will continue to utilize anti-inflammatory agents for her airway as noted above.  She has the option of utilizing a large collection of other medical therapy should it  be required.  As well she can always utilize her "action plan" should she develop a respiratory flareup.  If she does well with this plan I will see her back in his clinic in 6 months or earlier if there is a problem.  Santina Evans, MD Allergy / Immunology [facility]

## 2019-07-01 ENCOUNTER — Other Ambulatory Visit: Payer: Self-pay

## 2019-07-01 ENCOUNTER — Encounter: Payer: Self-pay | Admitting: Allergy and Immunology

## 2019-07-01 DIAGNOSIS — M25521 Pain in right elbow: Secondary | ICD-10-CM

## 2019-07-01 NOTE — Telephone Encounter (Signed)
Needs PT for evaluation of medial epicondylitis right elbow-

## 2019-07-21 ENCOUNTER — Other Ambulatory Visit: Payer: Self-pay

## 2019-07-21 ENCOUNTER — Ambulatory Visit (HOSPITAL_COMMUNITY): Payer: No Typology Code available for payment source | Attending: Internal Medicine

## 2019-07-21 DIAGNOSIS — I10 Essential (primary) hypertension: Secondary | ICD-10-CM | POA: Insufficient documentation

## 2019-07-21 DIAGNOSIS — R42 Dizziness and giddiness: Secondary | ICD-10-CM | POA: Diagnosis present

## 2019-07-22 ENCOUNTER — Other Ambulatory Visit: Payer: Self-pay | Admitting: Allergy and Immunology

## 2019-08-02 ENCOUNTER — Other Ambulatory Visit: Payer: Self-pay | Admitting: Allergy and Immunology

## 2019-08-07 ENCOUNTER — Ambulatory Visit (HOSPITAL_COMMUNITY)
Admission: RE | Admit: 2019-08-07 | Discharge: 2019-08-07 | Disposition: A | Discharge status: Home / Self Care | Payer: No Typology Code available for payment source | Source: Ambulatory Visit | Attending: Interventional Radiology | Admitting: Interventional Radiology

## 2019-08-07 ENCOUNTER — Other Ambulatory Visit: Payer: Self-pay

## 2019-08-07 DIAGNOSIS — H9313 Tinnitus, bilateral: Secondary | ICD-10-CM | POA: Insufficient documentation

## 2019-08-17 ENCOUNTER — Other Ambulatory Visit (HOSPITAL_COMMUNITY): Payer: Self-pay | Admitting: Interventional Radiology

## 2019-08-17 DIAGNOSIS — H9313 Tinnitus, bilateral: Secondary | ICD-10-CM

## 2019-08-24 ENCOUNTER — Ambulatory Visit (HOSPITAL_COMMUNITY)
Admission: RE | Admit: 2019-08-24 | Discharge: 2019-08-24 | Disposition: A | Discharge status: Home / Self Care | Payer: BC Managed Care – PPO | Source: Ambulatory Visit | Attending: Interventional Radiology | Admitting: Interventional Radiology

## 2019-08-24 ENCOUNTER — Other Ambulatory Visit: Payer: Self-pay

## 2019-08-24 DIAGNOSIS — H9313 Tinnitus, bilateral: Secondary | ICD-10-CM

## 2019-08-24 NOTE — Progress Notes (Signed)
Chief Complaint: Patient was seen in consultation today for bilateral high-riding jugular bulbs with diverticulum.  Referring Physician(s): Ermalinda Barrios  Supervising Physician: Julieanne Cotton  Patient Status: [facility] - Out-pt  History of Present Illness: Brenda Daniel is a 64 y.o. female with a past medical history as below, with pertinent past medical history including hypertension, hyperlipidemia, asthma, GERD, and DJD. She is known to [facility] and has been followed by Dr. Corliss Skains since 03/2019. She first presented to our department at the request of Dr. Dorma Russell for management of bilateral intermittent pulsatile tinnitus, L>R. She underwent an image-guided diagnostic cerebral arteriogram 04/24/2019 by Dr. Corliss Skains which revealed bilateral high riding jugular bulbs. At that time, it was also recommended that patient undergo CT temporal bones for further evaluation.  Diagnostic cerebral arteriogram 04/24/2019: 1. Prominent high riding jugular bulbs bilaterally. The one on the right measures 14.5 mm x 11.5 mm, and the one on the left measures 11.2 mm x 8.7 mm. Both of these appear to have a small diverticula at the apices.  CT temporal bones 08/07/2019: 1. Normal imaging of the temporal bones. 2. Possible high-riding jugular bulb with diverticulum on the left.  Patient presents today for follow-up to discuss findings of recent CT temporal bones 08/07/2019. Patient awake and alert sitting in chair. Complains of bilateral intermittent pulsatile tinnitus, L>R, stable since last consultation. States occurs when head is below her heart (specifically when she does yoga). Denies vertigo. Denies headache, weakness, numbness/tingling, dizziness, vision changes, hearing changes, or speech difficulty.   Past Medical History:  Diagnosis Date  . Allergic rhinitis    per Dr. Lucie Leather  . Asthma    per Dr. Lucie Leather  . DJD (degenerative joint disease)   . GERD (gastroesophageal reflux disease)   .  Hyperlipidemia   . Hypertension     Past Surgical History:  Procedure Laterality Date  . APPENDECTOMY    . BRAVO [place] STUDY N/A 03/16/2015   Procedure: BRAVO [place] STUDY;  Surgeon: Charlott Rakes, MD;  Location: [facility];  Service: Endoscopy;  Laterality: N/A;  . CESAREAN SECTION    . ESOPHAGOGASTRODUODENOSCOPY N/A 03/16/2015   Procedure: ESOPHAGOGASTRODUODENOSCOPY (EGD);  Surgeon: Charlott Rakes, MD;  Location: [facility];  Service: Endoscopy;  Laterality: N/A;  . IR ANGIO INTRA EXTRACRAN SEL COM CAROTID INNOMINATE BILAT MOD SED  04/24/2019  . IR ANGIO VERTEBRAL SEL VERTEBRAL BILAT MOD SED  04/24/2019  . IR US GUIDE VASC ACCESS RIGHT  04/24/2019  . MYOMECTOMY    . TEMPOROMANDIBULAR JOINT SURGERY Right   . TONSILLECTOMY      Allergies: Advil [ibuprofen], Niacin and related, and Red yeast rice [cholestin]  Medications: Prior to Admission medications   Medication Sig Start Date End Date Taking? Authorizing Provider  albuterol (PROVENTIL) (2.5 MG/3ML) 0.083% nebulizer solution Take 3 mLs (2.5 mg total) by nebulization every 6 (six) hours as needed for wheezing or shortness of breath. 11/24/18   Kozlow, Alvira Philips, MD  albuterol (VENTOLIN HFA) 108 (90 Base) MCG/ACT inhaler INHALE 2 PUFFS INTO THE LUNGS EVERY 4 (FOUR) HOURS AS NEEDED FOR WHEEZING OR SHORTNESS OF BREATH. 01/19/19   Kozlow, Alvira Philips, MD  Cholecalciferol (VITAMIN D PO) Take 1 tablet by mouth See admin instructions. Take one tablet by mouth daily on Monday thru Friday (skip Saturday and Sunday)    [provider]  clobetasol cream (TEMOVATE) 0.05 % Apply 1 application topically at bedtime as needed (itching on left nipple).    [provider]  famotidine (PEPCID) 40  MG tablet TAKE 1 TABLET BY MOUTH EVERY DAY 07/22/19   Kozlow, Alvira Philips, MD  fluticasone (FLONASE) 50 MCG/ACT nasal spray PLACE 1 SPRAY INTO BOTH NOSTRILS DAILY. 08/03/19   Kozlow, Alvira Philips, MD  fluticasone (FLOVENT HFA) 110 MCG/ACT inhaler Inhale 2 puffs into  the lungs 2 (two) times daily as needed (cough). Patient taking differently: Inhale 2 puffs into the lungs 2 (two) times daily.  12/23/18   Kozlow, Alvira Philips, MD  gelatin 650 MG capsule Take 650 mg by mouth See admin instructions. Mon - Fri    [provider]  ipratropium-albuterol (DUONEB) 0.5-2.5 (3) MG/3ML SOLN Take 3 mLs by nebulization every 4 (four) hours as needed. Patient taking differently: Take 3 mLs by nebulization every 4 (four) hours as needed (shortness of breath).  08/31/16   Marcelyn Bruins, MD  losartan (COZAAR) 100 MG tablet Take 100 mg by mouth daily. 05/21/17   [provider]  montelukast (SINGULAIR) 10 MG tablet Take 1 tablet (10 mg total) by mouth at bedtime as needed (cough). Patient taking differently: Take 10 mg by mouth at bedtime.  12/23/18   Kozlow, Alvira Philips, MD  nitroGLYCERIN (NITROSTAT) 0.4 MG SL tablet Place 1 tablet (0.4 mg total) under the tongue every 5 (five) minutes as needed for chest pain. 04/27/19 07/26/19  Parke Poisson, MD  Potassium 99 MG TABS Take 1 tablet by mouth at bedtime.     [provider]  traZODone (DESYREL) 50 MG tablet Take 25 mg by mouth at bedtime.    [provider]  triamterene-hydrochlorothiazide (MAXZIDE) 75-50 MG tablet Take 1 tablet by mouth daily.    [provider]  valACYclovir (VALTREX) 500 MG tablet Take 500 mg by mouth See admin instructions. Take one tablet (500 mg) by mouth daily at bedtime, take four tablets (2000 mg) once as one dose as needed at onset of fever blister    [provider]     Family History  Problem Relation Age of Onset  . Heart disease Father   . Heart attack Father   . Stroke Father   . Allergies Sister   . Eczema Sister   . Asthma Sister   . Hypertension Sister   . Hypertension Other        x 4 siblings  . Heart attack Brother   . Hypertension Brother   . Heart attack Paternal Grandfather   . Hypertension Sister     Social History    Socioeconomic History  . Marital status: Married    Spouse name: Not on file  . Number of children: 2  . Years of education: Not on file  . Highest education level: Not on file  Occupational History  . Occupation: judge  Tobacco Use  . Smoking status: Never Smoker  . Smokeless tobacco: Never Used  Substance and Sexual Activity  . Alcohol use: Yes    Alcohol/week: 0.0 standard drinks    Comment: glass a wine a day  . Drug use: No  . Sexual activity: Yes  Other Topics Concern  . Not on file  Social History Narrative  . Not on file   Social Determinants of Health   Financial Resource Strain:   . Difficulty of Paying Living Expenses:   Food Insecurity:   . Worried About Programme researcher, broadcasting/film/video in the Last Year:   . Barista in the Last Year:   Transportation Needs:   . Freight forwarder (Medical):   Marland Kitchen  Lack of Transportation (Non-Medical):   Physical Activity:   . Days of Exercise per Week:   . Minutes of Exercise per Session:   Stress:   . Feeling of Stress :   Social Connections:   . Frequency of Communication with Friends and Family:   . Frequency of Social Gatherings with Friends and Family:   . Attends Religious Services:   . Active Member of Clubs or Organizations:   . Attends Banker Meetings:   Marland Kitchen Marital Status:      Review of Systems: A 12 point ROS discussed and pertinent positives are indicated in the HPI above.  All other systems are negative.  Review of Systems  Constitutional: Negative for chills and fever.  HENT: Positive for tinnitus. Negative for hearing loss.   Eyes: Negative for visual disturbance.  Respiratory: Negative for shortness of breath and wheezing.   Cardiovascular: Negative for chest pain and palpitations.  Neurological: Negative for dizziness, speech difficulty, weakness, numbness and headaches.  Psychiatric/Behavioral: Negative for behavioral problems and confusion.    Vital Signs: There were no vitals  taken for this visit.  Physical Exam Constitutional:      General: She is not in acute distress.    Appearance: Normal appearance.  Pulmonary:     Effort: Pulmonary effort is normal. No respiratory distress.  Skin:    General: Skin is warm and dry.  Neurological:     Mental Status: She is alert and oriented to person, place, and time.  Psychiatric:        Mood and Affect: Mood normal.        Behavior: Behavior normal.      Imaging: CT TEMPORAL BONES WO CONTRAST  Result Date: 08/08/2019 CLINICAL DATA:  Pulsatile tinnitus EXAM: CT TEMPORAL BONES WITHOUT CONTRAST TECHNIQUE: Axial and coronal plane CT imaging of the petrous temporal bones was performed with thin-collimation image reconstruction. No intravenous contrast was administered. Multiplanar CT image reconstructions were also generated. COMPARISON:  None. FINDINGS: Right temporal bone: External auditory canal and tympanic membrane are unremarkable. Middle ear cleft and ossicles are unremarkable. Cochlea and semicircular canals are unremarkable. Tegmen is intact. Facial nerve demonstrates normal course. Mastoid air cells are clear. Left temporal bone: External auditory canal and tympanic membrane are unremarkable. Middle ear cleft and ossicles are unremarkable. Cochlea and semicircular canals are unremarkable. Tegmen is intact. Facial nerve demonstrates normal course. Mastoid air cells are clear. Possible high-riding jugular bulb and possible diverticulum. Limited intracranial imaging demonstrates no acute abnormality. Minor paranasal sinus mucosal thickening. IMPRESSION: Normal imaging of the temporal bones. Possible high-riding jugular bulb with diverticulum on the left. Electronically Signed   By: Guadlupe Spanish M.D.   On: 08/08/2019 20:44    Labs:  CBC: Recent Labs    04/24/19 0710  WBC 5.4  HGB 13.7  HCT 40.6  PLT 280    COAGS: Recent Labs    04/24/19 0710  INR 0.9    BMP: Recent Labs    04/24/19 0710  04/27/19 0937  NA 140 139  K 3.5 4.4  CL 103 97  CO2 28 25  GLUCOSE 106* 86  BUN 15 15  CALCIUM 9.3 10.0  CREATININE 1.17* 0.90  GFRNONAA 50* 69  GFRAA 58* 79     Assessment and Plan:  Bilateral high-riding jugular bulbs with diverticulum, L>R, seen on diagnostic cerebral arteriogram 04/24/2019 and CT temporal bones 08/07/2019. Dr. Corliss Skains was present for consultation.  Discussed findings of recent diagnostic cerebral arteriogram and CT  temporal bones- bilateral high-riding jugular bulbs with diverticulum. Explained there was no evidence of bone dehiscence based on CT temporal bones. Since patient is only experiencing intermittent tinnitus (denies vertigo or headaches), recommend continued conservative management including routine imaging scans to monitor for changes. Plan for follow-up with MRV head (with contrast) in 6 months. Informed patient that our schedulers will call her to set up this imaging scan. Instructed patient to call our office if symptoms change/worsen (schedulers numbers provided to patient).  All questions answered and concerns addressed. Patient conveys understanding and agrees with plan.  Thank you for this interesting consult.  I greatly enjoyed meeting KATISHA SHIMIZU and look forward to participating in their care.  A copy of this report was sent to the requesting provider on this date.  Electronically Signed: Earley Abide, PA-C 08/24/2019, 10:18 AM   I spent a total of 25 Minutes in face to face in clinical consultation, greater than 50% of which was counseling/coordinating care for bilateral high-riding jugular bulbs with diverticulum.

## 2019-09-10 ENCOUNTER — Other Ambulatory Visit: Payer: Self-pay | Admitting: Allergy and Immunology

## 2019-09-23 ENCOUNTER — Ambulatory Visit: Payer: BC Managed Care – PPO | Attending: Internal Medicine

## 2019-09-23 DIAGNOSIS — Z20822 Contact with and (suspected) exposure to covid-19: Secondary | ICD-10-CM

## 2019-09-24 LAB — SARS-COV-2, NAA 2 DAY TAT

## 2019-09-24 LAB — NOVEL CORONAVIRUS, NAA: SARS-CoV-2, NAA: NOT DETECTED

## 2019-10-06 ENCOUNTER — Telehealth: Payer: Self-pay | Admitting: Internal Medicine

## 2019-10-06 DIAGNOSIS — H349 Unspecified retinal vascular occlusion: Secondary | ICD-10-CM

## 2019-10-06 NOTE — Telephone Encounter (Signed)
Agree carotid doppler is appropriate, please order for [employer].

## 2019-10-06 NOTE — Telephone Encounter (Signed)
Patient's eye doctor Dr. Charlotte Sanes calling to speak with Dr. Lupe Carney nurse.

## 2019-10-06 NOTE — Telephone Encounter (Signed)
Dr Charlotte Sanes from [facility] called. 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   Dr Charlotte Sanes is recommending a  Carotid doppler  to be performed and would like Dr Jacques Navy to order since Dr Jacques Navy is patient covering  cardiology . Dr Charlotte Sanes is aware patient has had recent echo and she does not need another so soon.    aware will defer to Dr Jacques Navy  - awaiting  For approval for order

## 2019-10-08 NOTE — Telephone Encounter (Signed)
RN Called  No answer -- left  Detail message - for upcoming Carotid appointment schedule.  Any question may call back

## 2019-10-20 ENCOUNTER — Other Ambulatory Visit: Payer: Self-pay

## 2019-10-20 ENCOUNTER — Ambulatory Visit (HOSPITAL_COMMUNITY)
Admission: RE | Admit: 2019-10-20 | Discharge: 2019-10-20 | Disposition: A | Discharge status: Home / Self Care | Payer: No Typology Code available for payment source | Source: Ambulatory Visit | Attending: Cardiology | Admitting: Cardiology

## 2019-10-20 DIAGNOSIS — H349 Unspecified retinal vascular occlusion: Secondary | ICD-10-CM | POA: Insufficient documentation

## 2019-10-27 ENCOUNTER — Telehealth: Payer: Self-pay | Admitting: Internal Medicine

## 2019-10-27 NOTE — Telephone Encounter (Signed)
LMOM RE: F/U Appt 

## 2019-10-29 ENCOUNTER — Ambulatory Visit (INDEPENDENT_AMBULATORY_CARE_PROVIDER_SITE_OTHER): Payer: BC Managed Care – PPO | Admitting: Internal Medicine

## 2019-10-29 ENCOUNTER — Encounter: Payer: Self-pay | Admitting: Internal Medicine

## 2019-10-29 ENCOUNTER — Other Ambulatory Visit: Payer: Self-pay

## 2019-10-29 VITALS — BP 118/74 | HR 69 | Ht 63.0 in | Wt 178.2 lb

## 2019-10-29 DIAGNOSIS — E785 Hyperlipidemia, unspecified: Secondary | ICD-10-CM

## 2019-10-29 DIAGNOSIS — H349 Unspecified retinal vascular occlusion: Secondary | ICD-10-CM | POA: Diagnosis not present

## 2019-10-29 DIAGNOSIS — R42 Dizziness and giddiness: Secondary | ICD-10-CM

## 2019-10-29 DIAGNOSIS — R7303 Prediabetes: Secondary | ICD-10-CM

## 2019-10-29 DIAGNOSIS — I10 Essential (primary) hypertension: Secondary | ICD-10-CM | POA: Diagnosis not present

## 2019-10-29 DIAGNOSIS — R600 Localized edema: Secondary | ICD-10-CM

## 2019-10-29 NOTE — Patient Instructions (Addendum)
Medication Instructions:  No changes  *If you need a refill on your cardiac medications before your next appointment, please call your pharmacy*   Lab Work: Not needed If you have labs (blood work) drawn today and your tests are completely normal, you will receive your results only by: Marland Kitchen MyChart Message (if you have MyChart) OR . A paper copy in the mail If you have any lab test that is abnormal or we need to change your treatment, we will call you to review the results.   Testing/Procedures: Not needed   Follow-Up: At [facility], you and your health needs are our priority.  As part of our continuing mission to provide you with exceptional heart care, we have created designated Provider Care Teams.  These Care Teams include your primary Cardiologist (physician) and Advanced Practice Providers (APPs -  Physician Assistants and Nurse Practitioners) who all work together to provide you with the care you need, when you need it.    Your next appointment:   12 month(s)  The format for your next appointment:   In Person  Provider:   Weston Brass, MD   Other Instructions  Recommend  you wearing compression Stocking or knee hi  15 - 20 mmhg during the day .

## 2019-10-29 NOTE — Progress Notes (Signed)
Cardiology Office Note:    Date:  10/29/2019   ID:  Brenda Daniel, DOB 06-Mar-1956, MRN [id number]  PCP:  Laurann Montana, MD  Cardiologist:  No primary care provider on file.  Electrophysiologist:  None   Referring MD: Laurann Montana, MD   Chief Complaint: Left eye retinal vascular occlusion  History of Present Illness:    Brenda Daniel is a 64 y.o. female with a history of hypertension, hyperlipidemia, prediabetes, GERD. She works as a Education administrator.   Presents from ophthalmologist for L retinal vascular occlusion with recommendation for carotid Dopplers. These were performed prior to our visit an show mild carotid artery disease. Not currently on a statin, lipids are mildly elevated on last check from 10/09/19, checked by Dr. Cliffton Asters. We performed an echocardiogram on 07/21/19 for dizziness which was unremarkable. No worsening of dizziness. No CP or SOB recently.   Past Medical History:  Diagnosis Date  . Allergic rhinitis    per Dr. Lucie Leather  . Asthma    per Dr. Lucie Leather  . DJD (degenerative joint disease)   . GERD (gastroesophageal reflux disease)   . Hyperlipidemia   . Hypertension     Past Surgical History:  Procedure Laterality Date  . APPENDECTOMY    . BRAVO PH STUDY N/A 03/16/2015   Procedure: BRAVO PH STUDY;  Surgeon: Charlott Rakes, MD;  Location: [facility];  Service: Endoscopy;  Laterality: N/A;  . CESAREAN SECTION    . ESOPHAGOGASTRODUODENOSCOPY N/A 03/16/2015   Procedure: ESOPHAGOGASTRODUODENOSCOPY (EGD);  Surgeon: Charlott Rakes, MD;  Location: [facility];  Service: Endoscopy;  Laterality: N/A;  . IR ANGIO INTRA EXTRACRAN SEL COM CAROTID INNOMINATE BILAT MOD SED  04/24/2019  . IR ANGIO VERTEBRAL SEL VERTEBRAL BILAT MOD SED  04/24/2019  . IR US GUIDE VASC ACCESS RIGHT  04/24/2019  . MYOMECTOMY    . TEMPOROMANDIBULAR JOINT SURGERY Right   . TONSILLECTOMY      Current Medications: Current Meds  Medication Sig  . albuterol (PROVENTIL) (2.5 MG/3ML) 0.083%  nebulizer solution Take 3 mLs (2.5 mg total) by nebulization every 6 (six) hours as needed for wheezing or shortness of breath.  Marland Kitchen albuterol (VENTOLIN HFA) 108 (90 Base) MCG/ACT inhaler INHALE 2 PUFFS INTO THE LUNGS EVERY 4 (FOUR) HOURS AS NEEDED FOR WHEEZING OR SHORTNESS OF BREATH.  Marland Kitchen Cholecalciferol (VITAMIN D PO) Take 1 tablet by mouth See admin instructions. Take one tablet by mouth daily on Monday thru Friday (skip Saturday and Sunday)  . esomeprazole (NEXIUM) 40 MG capsule Take 40 mg by mouth daily as needed.  . famotidine (PEPCID) 40 MG tablet TAKE 1 TABLET BY MOUTH EVERY DAY  . fluticasone (FLONASE) 50 MCG/ACT nasal spray PLACE 1 SPRAY INTO BOTH NOSTRILS DAILY.  . fluticasone (FLOVENT HFA) 110 MCG/ACT inhaler Inhale 2 puffs into the lungs 2 (two) times daily as needed (cough). (Patient taking differently: Inhale 2 puffs into the lungs 2 (two) times daily. )  . gelatin 650 MG capsule Take 650 mg by mouth See admin instructions. Mon - Fri  . ipratropium-albuterol (DUONEB) 0.5-2.5 (3) MG/3ML SOLN Take 3 mLs by nebulization every 4 (four) hours as needed. (Patient taking differently: Take 3 mLs by nebulization every 4 (four) hours as needed (shortness of breath). )  . losartan (COZAAR) 100 MG tablet Take 100 mg by mouth daily.  . montelukast (SINGULAIR) 10 MG tablet Take 1 tablet (10 mg total) by mouth at bedtime.  . nitroGLYCERIN (NITROSTAT) 0.4 MG SL tablet Place 1 tablet (0.4 mg total)  under the tongue every 5 (five) minutes as needed for chest pain.  Marland Kitchen Potassium 99 MG TABS Take 1 tablet by mouth at bedtime.   . traZODone (DESYREL) 50 MG tablet Take 25 mg by mouth at bedtime.  . triamterene-hydrochlorothiazide (MAXZIDE) 75-50 MG tablet Take 1 tablet by mouth daily.  . valACYclovir (VALTREX) 500 MG tablet Take 500 mg by mouth See admin instructions. Take one tablet (500 mg) by mouth daily at bedtime, take four tablets (2000 mg) once as one dose as needed at onset of fever blister      Allergies:   Advil [ibuprofen], Niacin and related, and Red yeast rice [cholestin]   Social History   Socioeconomic History  . Marital status: Married    Spouse name: Not on file  . Number of children: 2  . Years of education: Not on file  . Highest education level: Not on file  Occupational History  . Occupation: judge  Tobacco Use  . Smoking status: Never Smoker  . Smokeless tobacco: Never Used  Vaping Use  . Vaping Use: Never used  Substance and Sexual Activity  . Alcohol use: Yes    Alcohol/week: 0.0 standard drinks    Comment: glass a wine a day  . Drug use: No  . Sexual activity: Yes  Other Topics Concern  . Not on file  Social History Narrative  . Not on file   Social Determinants of Health   Financial Resource Strain:   . Difficulty of Paying Living Expenses:   Food Insecurity:   . Worried About Programme researcher, broadcasting/film/video in the Last Year:   . Barista in the Last Year:   Transportation Needs:   . Freight forwarder (Medical):   Marland Kitchen Lack of Transportation (Non-Medical):   Physical Activity:   . Days of Exercise per Week:   . Minutes of Exercise per Session:   Stress:   . Feeling of Stress :   Social Connections:   . Frequency of Communication with Friends and Family:   . Frequency of Social Gatherings with Friends and Family:   . Attends Religious Services:   . Active Member of Clubs or Organizations:   . Attends Banker Meetings:   Marland Kitchen Marital Status:      Family History: The patient's family history includes Allergies in her sister; Asthma in her sister; Eczema in her sister; Heart attack in her brother, father, and paternal grandfather; Heart disease in her father; Hypertension in her brother, sister, sister, and another family member; Stroke in her father.  ROS:   Please see the history of present illness.    All other systems reviewed and are negative.  EKGs/Labs/Other Studies Reviewed:    The following studies were reviewed  today:  EKG:  NSR, nonspecific T wave abnormality.   Recent Labs: 04/24/2019: Hemoglobin 13.7; Platelets 280 04/27/2019: BUN 15; Creatinine, Ser 0.90; Potassium 4.4; Sodium 139  Recent Lipid Panel No results found for: CHOL, TRIG, HDL, CHOLHDL, VLDL, LDLCALC, LDLDIRECT  Physical Exam:    VS:  BP 118/74   Pulse 69   Ht 5\' 3"  (1.6 m)   Wt 178 lb 3.2 oz (80.8 kg)   SpO2 96%   BMI 31.57 kg/m     Wt Readings from Last 5 Encounters:  10/29/19 178 lb 3.2 oz (80.8 kg)  06/30/19 189 lb 9.6 oz (86 kg)  04/27/19 189 lb (85.7 kg)  04/24/19 185 lb (83.9 kg)  03/03/19 180 lb (81.6 kg)  Constitutional: No acute distress Eyes: sclera non-icteric, normal conjunctiva and lids ENMT: normal dentition, moist mucous membranes Cardiovascular: regular rhythm, normal rate, no murmurs. S1 and S2 normal. Radial pulses normal bilaterally. No jugular venous distention.  Respiratory: clear to auscultation bilaterally GI : normal bowel sounds, soft and nontender. No distention.   MSK: extremities warm, well perfused. No edema.  NEURO: grossly nonfocal exam, moves all extremities. PSYCH: alert and oriented x 3, normal mood and affect.   ASSESSMENT:    1. Retinal vascular occlusion of left eye   2. Dizziness   3. Essential hypertension   4. Hyperlipidemia, unspecified hyperlipidemia type   5. Prediabetes    PLAN:    Retinal vascular occlusion of left eye - Plan: EKG 12-Lead - mild carotid artery disease on carotid Doppler.  - would consider addition of statin and ASA 81 mg daily, can discuss further with Dr. Dema Severin. Also had recent cerebral arteriogram. - No definite intracardiac source of embolism. Can consider TEE if concern persists.   Dizziness - stable. Unremarkable cardiac monitor and echocardiogram.   Essential hypertension - stable, continue losartan 100 mg daily, triamterene HCTZ 75-50 mg.   Hyperlipidemia, unspecified hyperlipidemia type - elevated but not on statin therapy. In  setting of retinal vascular occlusion on opthalmologic exam, consider addition of statin and ASA.Recommend Mediterranean diet.  Prediabetes - diet controlled. HbA1C 6.2   Total time of encounter: 30 minutes total time of encounter, including 20 minutes spent in face-to-face patient care on the date of this encounter. This time includes coordination of care and counseling regarding above mentioned problem list. Remainder of non-face-to-face time involved reviewing chart documents/testing relevant to the patient encounter and documentation in the medical record. I have independently reviewed documentation from referring provider.   Cherlynn Kaiser, MD [place]  [facility]    Medication Adjustments/Labs and Tests Ordered: Current medicines are reviewed at length with the patient today.  Concerns regarding medicines are outlined above.  Orders Placed This Encounter  Procedures  . EKG 12-Lead   No orders of the defined types were placed in this encounter.   Patient Instructions  Medication Instructions:  No changes  *If you need a refill on your cardiac medications before your next appointment, please call your pharmacy*   Lab Work: Not needed If you have labs (blood work) drawn today and your tests are completely normal, you will receive your results only by: Marland Kitchen MyChart Message (if you have MyChart) OR . A paper copy in the mail If you have any lab test that is abnormal or we need to change your treatment, we will call you to review the results.   Testing/Procedures: Not needed   Follow-Up: At [facility], you and your health needs are our priority.  As part of our continuing mission to provide you with exceptional heart care, we have created designated Provider Care Teams.  These Care Teams include your primary Cardiologist (physician) and Advanced Practice Providers (APPs -  Physician Assistants and Nurse Practitioners) who all work together to provide you with the care  you need, when you need it.    Your next appointment:   12 month(s)  The format for your next appointment:   In Person  Provider:   Cherlynn Kaiser, MD   Other Instructions  Recommend  you wearing compression Stocking or knee hi  15 - 20 mmhg during the day .

## 2019-11-02 ENCOUNTER — Other Ambulatory Visit: Payer: Self-pay | Admitting: Family Medicine

## 2019-11-02 DIAGNOSIS — Z1231 Encounter for screening mammogram for malignant neoplasm of breast: Secondary | ICD-10-CM

## 2019-11-19 ENCOUNTER — Ambulatory Visit: Payer: BC Managed Care – PPO

## 2019-11-20 ENCOUNTER — Ambulatory Visit
Admission: RE | Admit: 2019-11-20 | Discharge: 2019-11-20 | Disposition: A | Discharge status: Home / Self Care | Payer: BC Managed Care – PPO | Source: Ambulatory Visit | Attending: Family Medicine | Admitting: Family Medicine

## 2019-11-20 ENCOUNTER — Other Ambulatory Visit: Payer: Self-pay

## 2019-11-20 DIAGNOSIS — Z1231 Encounter for screening mammogram for malignant neoplasm of breast: Secondary | ICD-10-CM

## 2019-11-25 ENCOUNTER — Other Ambulatory Visit: Payer: Self-pay | Admitting: Family Medicine

## 2019-11-25 DIAGNOSIS — R928 Other abnormal and inconclusive findings on diagnostic imaging of breast: Secondary | ICD-10-CM

## 2019-12-14 ENCOUNTER — Ambulatory Visit
Admission: RE | Admit: 2019-12-14 | Discharge: 2019-12-14 | Disposition: A | Discharge status: Home / Self Care | Payer: BC Managed Care – PPO | Source: Ambulatory Visit | Attending: Family Medicine | Admitting: Family Medicine

## 2019-12-14 ENCOUNTER — Other Ambulatory Visit: Payer: Self-pay

## 2019-12-14 ENCOUNTER — Ambulatory Visit
Admission: RE | Admit: 2019-12-14 | Discharge: 2019-12-14 | Disposition: A | Discharge status: Home / Self Care | Payer: No Typology Code available for payment source | Source: Ambulatory Visit | Attending: Family Medicine | Admitting: Family Medicine

## 2019-12-14 DIAGNOSIS — R928 Other abnormal and inconclusive findings on diagnostic imaging of breast: Secondary | ICD-10-CM

## 2019-12-15 ENCOUNTER — Other Ambulatory Visit: Payer: BC Managed Care – PPO

## 2019-12-29 ENCOUNTER — Ambulatory Visit (INDEPENDENT_AMBULATORY_CARE_PROVIDER_SITE_OTHER): Payer: No Typology Code available for payment source | Admitting: Allergy and Immunology

## 2019-12-29 ENCOUNTER — Encounter: Payer: Self-pay | Admitting: Allergy and Immunology

## 2019-12-29 ENCOUNTER — Other Ambulatory Visit: Payer: Self-pay

## 2019-12-29 VITALS — BP 116/72 | HR 79 | Temp 97.0°F

## 2019-12-29 DIAGNOSIS — K219 Gastro-esophageal reflux disease without esophagitis: Secondary | ICD-10-CM | POA: Diagnosis not present

## 2019-12-29 DIAGNOSIS — J3089 Other allergic rhinitis: Secondary | ICD-10-CM | POA: Diagnosis not present

## 2019-12-29 DIAGNOSIS — J4540 Moderate persistent asthma, uncomplicated: Secondary | ICD-10-CM | POA: Diagnosis not present

## 2019-12-29 NOTE — Patient Instructions (Signed)
   1. Continue to treat and prevent inflammation:   A. Flonase 1-2 sprays each nostril one time per day  B. Montelukast 10mg  one time per day  2. If needed:   A. Antihistamine   B. Proair HFA - 2 inhalations or Xopenex neb every 4-6 hours  C. Mucinex DM - 2 tablets two times per day  D. Famotidine 40 mg - 1 tablet 1 time per day  4. "Action Plan" for flare up:   A. Start Flovent 110 to 3 inhalations 3 times per day  B. Add Nexium 40 mg twice a day if needed  5. Obtain fall flu vaccine  6. Return to clinic in 12 months or earlier if problem
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
## 2019-12-30 ENCOUNTER — Encounter: Payer: Self-pay | Admitting: Allergy and Immunology

## 2020-02-08 ENCOUNTER — Other Ambulatory Visit: Payer: Self-pay | Admitting: Allergy and Immunology

## 2020-02-11 ENCOUNTER — Other Ambulatory Visit: Payer: Self-pay | Admitting: Allergy and Immunology

## 2020-02-17 ENCOUNTER — Encounter: Payer: Self-pay | Admitting: Orthopaedic Surgery

## 2020-02-17 ENCOUNTER — Other Ambulatory Visit: Payer: Self-pay | Admitting: Allergy and Immunology

## 2020-02-17 ENCOUNTER — Ambulatory Visit: Payer: Self-pay

## 2020-02-17 ENCOUNTER — Other Ambulatory Visit: Payer: Self-pay

## 2020-02-17 ENCOUNTER — Ambulatory Visit (INDEPENDENT_AMBULATORY_CARE_PROVIDER_SITE_OTHER): Payer: No Typology Code available for payment source | Admitting: Orthopaedic Surgery

## 2020-02-17 VITALS — Ht 63.0 in | Wt 163.0 lb

## 2020-02-17 DIAGNOSIS — M79672 Pain in left foot: Secondary | ICD-10-CM

## 2020-02-17 DIAGNOSIS — M7701 Medial epicondylitis, right elbow: Secondary | ICD-10-CM

## 2020-02-17 DIAGNOSIS — M79671 Pain in right foot: Secondary | ICD-10-CM

## 2020-02-17 DIAGNOSIS — M21611 Bunion of right foot: Secondary | ICD-10-CM

## 2020-02-17 DIAGNOSIS — M21612 Bunion of left foot: Secondary | ICD-10-CM

## 2020-02-17 MED ORDER — LIDOCAINE HCL 1 % IJ SOLN
1.0000 mL | INTRAMUSCULAR | Status: AC | PRN
  Administered 2020-02-17: 1 mL

## 2020-02-17 MED ORDER — METHYLPREDNISOLONE ACETATE 40 MG/ML IJ SUSP
40.0000 mg | INTRAMUSCULAR | Status: AC | PRN
  Administered 2020-02-17: 40 mg

## 2020-02-17 NOTE — Progress Notes (Signed)
Office Visit Note   Patient: Brenda Daniel           Date of Birth: 03-Apr-1956           MRN: [id number] Visit Date: 02/17/2020              Requested by: Laurann Montana, MD [name] [place],  [place] [address] PCP: Laurann Montana, MD   Assessment & Plan: Visit Diagnoses:  1. Pain in left foot   2. Pain in right foot   3. Epicondylitis elbow, medial, right   4. Bilateral bunions     Plan: Ekam has recurrent medial epicondylitis of her right elbow.  She had one prior cortisone injection last year that gave her considerable relief of her pain.  She had some follow-up physical therapy but has had some recurrence over the past 6 weeks.  No history of injury or trauma.  Long discussion regarding further treatment options and further diagnostic testing.  For the moment I will reinject the medial epicondyles and monitor response.  No evidence of ulnar nerve involvement.  Also had a long discussion regarding her bunions.  She is fairly comfortable with good fitting shoes and innersoles.  I would not suggest surgery.  Her bunions are hereditary.  She did have a painful callus and the plantar aspect of her left fifth metatarsal head which I pared    Follow-Up Instructions: Return if symptoms worsen or fail to improve.   Orders:  Orders Placed This Encounter  Procedures  . Hand/UE Inj  . XR Foot Complete Left  . XR Foot Complete Right   No orders of the defined types were placed in this encounter.     Procedures: Hand/UE Inj for medial epicondylitis on 02/17/2020 12:02 PM Medications: 1 mL lidocaine 1 %; 40 mg methylPREDNISolone acetate 40 MG/ML      Clinical Data: No additional findings.   Subjective: Chief Complaint  Patient presents with  . Right Elbow - Pain  . Left Foot - Pain  . Right Foot - Pain  Patient presents today for recurrent chronic right elbow pain, and bilateral foot pain. She was here in October of last year for her right  elbow. She states that it seemed to get better, but then the pain returned and has worsened. Her pain is located medially and stays in that area. Certain activities like pushing or pulling a heavy door will cause pain; and doing any sort of plank will hurt.  She also states that she has had bilateral bunions for years that hurt, but more recently in August developed pain bilaterally at the base of her pinky toes. She said that some shoes definitely cause more pain, but the pain can also be very random in occurrence.   HPI  Review of Systems   Objective: Vital Signs: Ht 5\' 3"  (1.6 m)   Wt 163 lb (73.9 kg)   BMI 28.87 kg/m   Physical Exam Constitutional:      Appearance: She is well-developed.  Eyes:     Pupils: Pupils are equal, round, and reactive to light.  Pulmonary:     Effort: Pulmonary effort is normal.  Skin:    General: Skin is warm and dry.  Neurological:     Mental Status: She is alert and oriented to person, place, and time.  Psychiatric:        Behavior: Behavior normal.     Ortho Exam right elbow with tenderness directly over the medial  epicondyles.  Full range of motion.  Some pain with grip more in flexion and extension.  Some mild Tinel's over the ulnar nerve of both elbows but no pain and neurologically intact.  Bilateral bunions with hallux valgus and splayfoot.  Does have multiple calluses in the plantar aspect of both feet laterally at the metatarsal phalangeal joint of the great toe, the second metatarsal phalangeal joint and the fifth metatarsal phalangeal joint.  On the left there is a painful corn with a central core.  Good capillary refill to toes.  Does have some decrease in the arch.  Good pulses  Specialty Comments:  No specialty comments available.  Imaging: XR Foot Complete Left  Result Date: 02/17/2020 Films of the right foot did not reveal any acute changes.  There is about a 10 degree angulation between the first and second metatarsal with  hallux valgus and bunion formation. no obvious degenerative changes at the first metatarsal phalangeal joint.  No ectopic calcification.    XR Foot Complete Right  Result Date: 02/17/2020 Films of the right foot were obtained in 3 projections.  There is about a 10 degree angulation between the first and second metatarsal with hallux valgus and bunion formation.  There are degenerative changes of the first metatarsal phalangeal joint appears that there is a bipartite fibular sesamoid    PMFS History: Patient Active Problem List   Diagnosis Date Noted  . Epicondylitis elbow, medial, right 02/17/2020  . Bilateral bunions 02/17/2020  . Medial epicondylitis of elbow, left 03/03/2019  . Arch pain, unspecified laterality 03/03/2019  . Pain in right foot 03/03/2019  . Cough 05/23/2018  . Moderate persistent asthma with acute exacerbation 05/23/2018  . Perennial allergic rhinitis 05/23/2018  . Carpal tunnel syndrome, left upper limb 02/18/2017  . Pain in left hip 04/23/2016  . Pain in both upper extremities 04/23/2016  . Mild intermittent asthma with acute exacerbation 06/01/2015  . LPRD (laryngopharyngeal reflux disease) 06/01/2015  . Allergic rhinoconjunctivitis 06/01/2015  . Morbid obesity (HCC) 04/02/2015  . GERD (gastroesophageal reflux disease) 03/16/2015  . Upper airway cough syndrome 03/16/2015   Past Medical History:  Diagnosis Date  . Allergic rhinitis    per Dr. Lucie Leather  . Asthma    per Dr. Lucie Leather  . DJD (degenerative joint disease)   . GERD (gastroesophageal reflux disease)   . Hyperlipidemia   . Hypertension     Family History  Problem Relation Age of Onset  . Heart disease Father   . Heart attack Father   . Stroke Father   . Allergies Sister   . Eczema Sister   . Asthma Sister   . Hypertension Sister   . Hypertension Other        x 4 siblings  . Heart attack Brother   . Hypertension Brother   . Heart attack Paternal Grandfather   . Hypertension Sister       Past Surgical History:  Procedure Laterality Date  . APPENDECTOMY    . BRAVO PH STUDY N/A 03/16/2015   Procedure: BRAVO PH STUDY;  Surgeon: Charlott Rakes, MD;  Location: [facility];  Service: Endoscopy;  Laterality: N/A;  . CESAREAN SECTION    . ESOPHAGOGASTRODUODENOSCOPY N/A 03/16/2015   Procedure: ESOPHAGOGASTRODUODENOSCOPY (EGD);  Surgeon: Charlott Rakes, MD;  Location: [facility];  Service: Endoscopy;  Laterality: N/A;  . IR ANGIO INTRA EXTRACRAN SEL COM CAROTID INNOMINATE BILAT MOD SED  04/24/2019  . IR ANGIO VERTEBRAL SEL VERTEBRAL BILAT MOD SED  04/24/2019  . IR  US GUIDE VASC ACCESS RIGHT  04/24/2019  . MYOMECTOMY    . TEMPOROMANDIBULAR JOINT SURGERY Right   . TONSILLECTOMY     Social History   Occupational History  . Occupation: judge  Tobacco Use  . Smoking status: Never Smoker  . Smokeless tobacco: Never Used  Vaping Use  . Vaping Use: Never used  Substance and Sexual Activity  . Alcohol use: Yes    Alcohol/week: 0.0 standard drinks    Comment: glass a wine a day  . Drug use: No  . Sexual activity: Yes

## 2020-05-18 ENCOUNTER — Telehealth (HOSPITAL_COMMUNITY): Payer: Self-pay

## 2020-05-18 NOTE — Telephone Encounter (Signed)
Called to schedule mrv, no answer, left vm. AW  

## 2020-05-18 NOTE — Telephone Encounter (Signed)
Called to schedule mrv. Pt does not want to schedule at this time. She is not having any sx and would like to continue f/u with Dr. Dorma Russell. Will call back if she decides that she wants to schedule. AW

## 2020-06-24 ENCOUNTER — Other Ambulatory Visit: Payer: Self-pay | Admitting: Allergy and Immunology

## 2020-10-12 IMAGING — CT CT HEART MORP W/ CTA COR W/ SCORE W/ CA W/CM &/OR W/O CM
4 of 7 series · 8 of 20 positions shown, 9 images · non-contrast
Comparison: None.

Addendum:
EXAM:
OVER-READ INTERPRETATION  CT CHEST

The following report is an over-read performed by radiologist Dr.
Gjermani Mariglesa [REDACTED] on 05/08/2018. This
over-read does not include interpretation of cardiac or coronary
anatomy or pathology. The coronary CTA interpretation by the
cardiologist is attached.
HISTORY: Dx Chest Pain
Cardiac/Coronary  CT
TECHNIQUE: The patient was scanned on a Siemens Force scanner.
PROTOCOL: A 120 kV prospective scan was triggered in the descending thoracic
aorta at 111 HU's. Axial non-contrast 3 mm slices were carried out
through the heart. The data set was analyzed on a dedicated work
station and scored using the Agatson method. Gantry rotation speed
was 250 msecs and collimation was .6 mm. The 3D data set was
reconstructed in 5% intervals of the 67-82 % of the R-R cycle.
Diastolic phases were analyzed on a dedicated work station using
MPR, MIP and VRT modes. The patient received 80 cc of contrast.

[Series 8: best diast 73 % · axial · 0.34mm/px · z∈[+982,+1025]mm · 2 of 328 slices shown, 3 images]
[im 110/328  vessel]
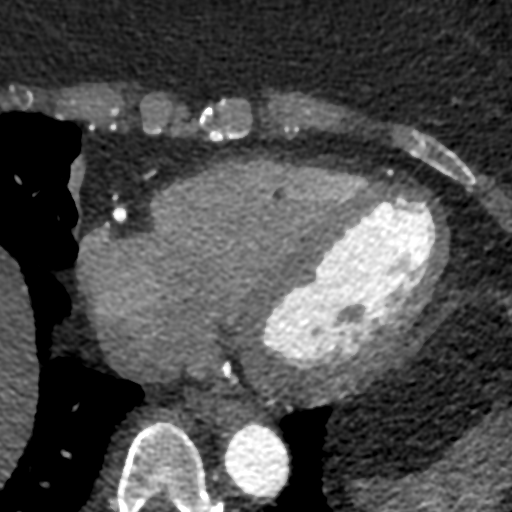
[im 110/328  lung]
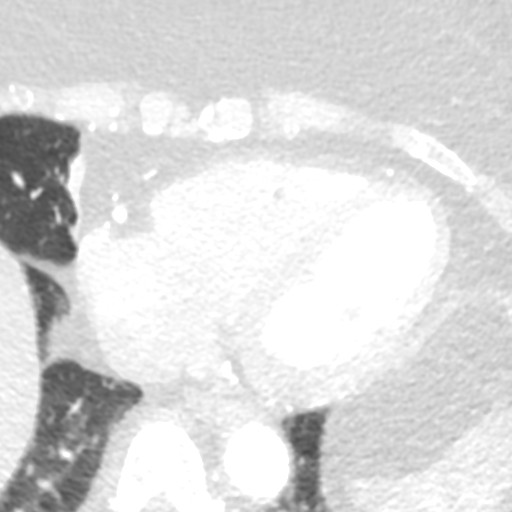
[im 219/328  vessel]
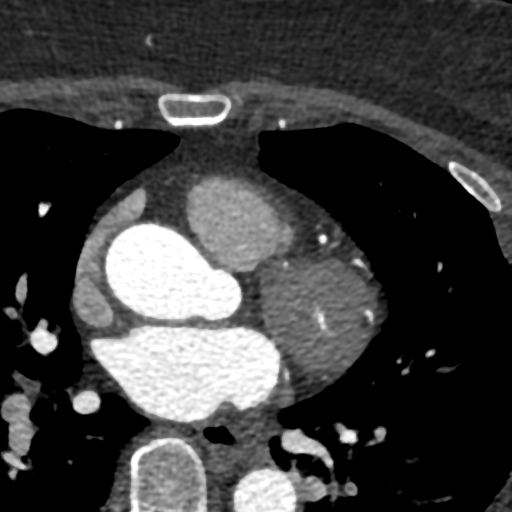

[Series 9: best syst 37 % · axial · 0.34mm/px · z∈[+982,+1025]mm · 2 of 328 slices shown]
[im 110/328  vessel]
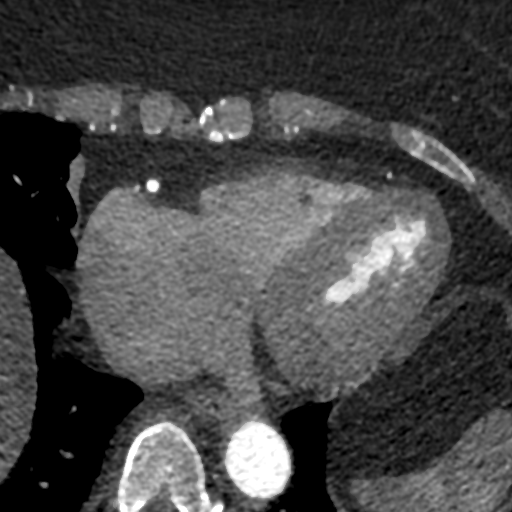
[im 219/328  vessel]
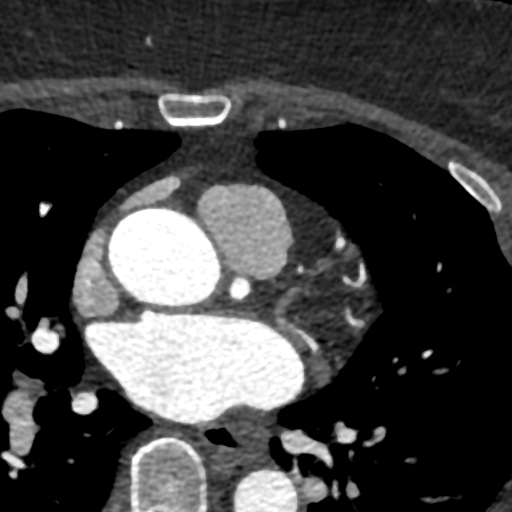

[Series 10: ts diast sharp 73 % · axial · 0.34mm/px · z∈[+982,+1025]mm · 2 of 328 slices shown]
[im 110/328  lung]
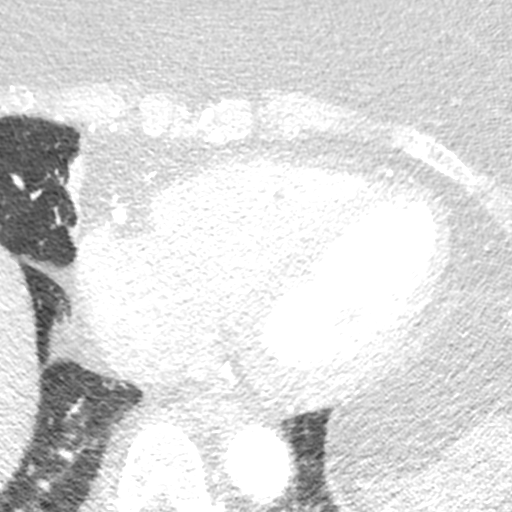
[im 219/328  lung]
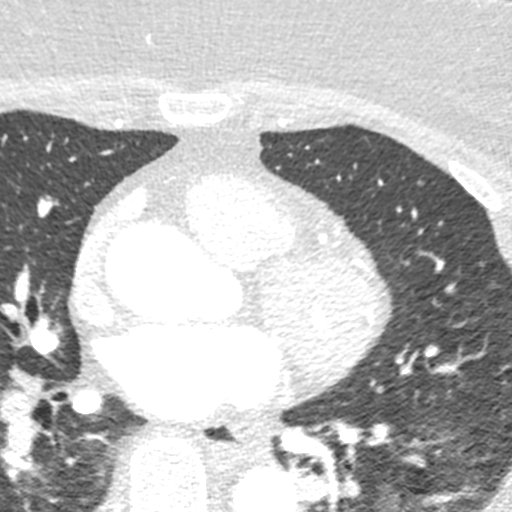

[Series 11: ts syst sharp 37 % · axial · 0.34mm/px · z∈[+982,+1025]mm · 2 of 328 slices shown]
[im 110/328  lung]
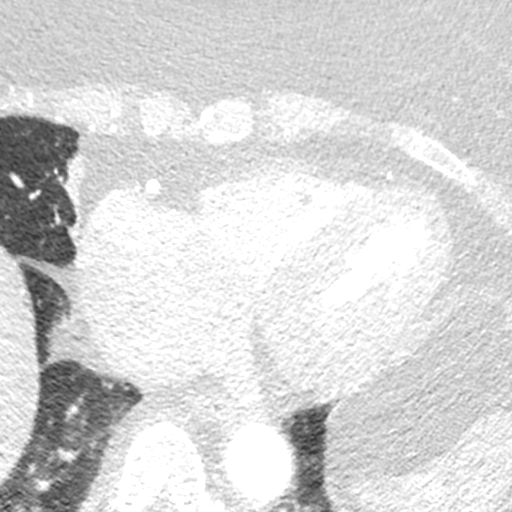
[im 219/328  lung]
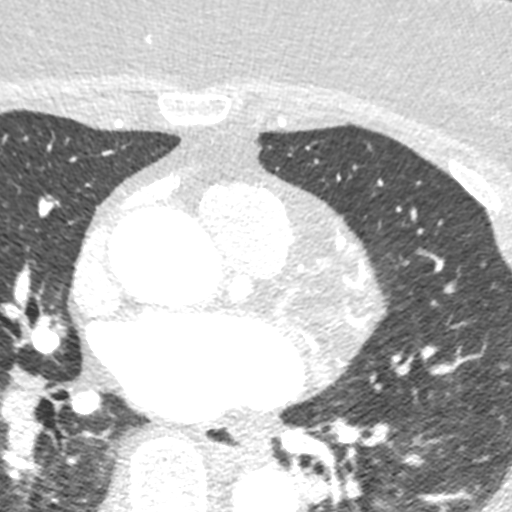

[8 of 20 positions shown; findings below may reference images not displayed]

FINDINGS: Vascular: Heart is normal size.  Aorta is normal caliber.

Mediastinum/Nodes: No adenopathy in the lower mediastinum or hila.

Lungs/Pleura: Dependent and linear bibasilar atelectasis. No
effusions.

Upper Abdomen: Numerous low-density lesions throughout the liver
which are difficult to characterize and incompletely evaluated on
this coronary CTA.

Musculoskeletal: Chest wall soft tissues are unremarkable. No acute
bony abnormality.
IMPRESSION: Numerous low-density lesions throughout the visualized liver. These
may reflect cysts, but can not be fully characterized. Recommend
further characterisation with ultrasound.

Dependent and bibasilar atelectasis.
FINDINGS: Coronary calcium score: The patient's coronary artery calcium score
is 0, which places the patient in the 0 percentile.

Coronary arteries: Normal coronary origins.  Right dominance.

Right Coronary Artery: No detectable plaque or stenosis. Patent PDA
and posterolateral artery.

Left Main Coronary Artery: No detectable plaque or stenosis.

Left Anterior Descending Coronary Artery: Minimal atherosclerotic
plaque in the proximal LAD (<25% stenosis). Patent diagonal
branches.

Left Circumflex Artery: Patent small caliber left circumflex artery
without detectable plaque or stenosis.

Aorta:  Normal size.  No calcifications.  No dissection.

Aortic Valve: No calcifications.

Other findings:

Normal pulmonary vein drainage into the left atrium.

Normal left atrial appendage without a thrombus.

Normal size of the pulmonary artery.
IMPRESSION: 1. Coronary calcium score of 0. This was 0 percentile for age and
sex matched control.

2. Normal coronary origin with right dominance.

3. Minimal CAD in the proximal LAD, CADRADS = 1.

*** End of Addendum ***

## 2020-11-08 ENCOUNTER — Other Ambulatory Visit (HOSPITAL_COMMUNITY)
Admission: RE | Admit: 2020-11-08 | Discharge: 2020-11-08 | Disposition: A | Discharge status: Home / Self Care | Payer: No Typology Code available for payment source | Source: Ambulatory Visit | Attending: Family Medicine | Admitting: Family Medicine

## 2020-11-08 ENCOUNTER — Other Ambulatory Visit: Payer: Self-pay | Admitting: Family Medicine

## 2020-11-08 DIAGNOSIS — Z124 Encounter for screening for malignant neoplasm of cervix: Secondary | ICD-10-CM | POA: Insufficient documentation

## 2020-11-09 LAB — CYTOLOGY - PAP
Comment: NEGATIVE
Diagnosis: NEGATIVE
High risk HPV: NEGATIVE

## 2020-11-15 ENCOUNTER — Ambulatory Visit: Payer: No Typology Code available for payment source | Admitting: Internal Medicine

## 2020-12-27 ENCOUNTER — Ambulatory Visit: Payer: No Typology Code available for payment source | Admitting: Allergy and Immunology

## 2021-01-13 ENCOUNTER — Other Ambulatory Visit: Payer: Self-pay | Admitting: Allergy and Immunology

## 2021-01-14 ENCOUNTER — Other Ambulatory Visit: Payer: Self-pay | Admitting: Allergy and Immunology

## 2021-01-26 DIAGNOSIS — I879 Disorder of vein, unspecified: Secondary | ICD-10-CM | POA: Insufficient documentation

## 2021-02-14 ENCOUNTER — Ambulatory Visit (INDEPENDENT_AMBULATORY_CARE_PROVIDER_SITE_OTHER): Payer: No Typology Code available for payment source | Admitting: Allergy and Immunology

## 2021-02-14 ENCOUNTER — Other Ambulatory Visit: Payer: Self-pay

## 2021-02-14 VITALS — BP 132/82 | HR 75 | Temp 98.0°F | Resp 16 | Ht 63.0 in | Wt 160.8 lb

## 2021-02-14 DIAGNOSIS — K219 Gastro-esophageal reflux disease without esophagitis: Secondary | ICD-10-CM

## 2021-02-14 DIAGNOSIS — J3089 Other allergic rhinitis: Secondary | ICD-10-CM

## 2021-02-14 DIAGNOSIS — J4540 Moderate persistent asthma, uncomplicated: Secondary | ICD-10-CM | POA: Diagnosis not present

## 2021-02-14 DIAGNOSIS — J4521 Mild intermittent asthma with (acute) exacerbation: Secondary | ICD-10-CM

## 2021-02-14 MED ORDER — FAMOTIDINE 40 MG PO TABS: ORAL_TABLET | ORAL | 1 refills | Status: DC

## 2021-02-14 MED ORDER — ESOMEPRAZOLE MAGNESIUM 40 MG PO CPDR: 40.0000 mg | DELAYED_RELEASE_CAPSULE | Freq: Two times a day (BID) | ORAL | 1 refills | Status: DC

## 2021-02-14 MED ORDER — ALBUTEROL SULFATE HFA 108 (90 BASE) MCG/ACT IN AERS: 2.0000 | INHALATION_SPRAY | RESPIRATORY_TRACT | 1 refills | Status: DC | PRN

## 2021-02-14 MED ORDER — MONTELUKAST SODIUM 10 MG PO TABS: 10.0000 mg | ORAL_TABLET | Freq: Every day | ORAL | 1 refills | Status: DC

## 2021-02-14 MED ORDER — FLUTICASONE PROPIONATE HFA 110 MCG/ACT IN AERO: 2.0000 | INHALATION_SPRAY | Freq: Two times a day (BID) | RESPIRATORY_TRACT | 1 refills | Status: DC | PRN

## 2021-02-14 MED ORDER — IPRATROPIUM-ALBUTEROL 0.5-2.5 (3) MG/3ML IN SOLN
3.0000 mL | RESPIRATORY_TRACT | 1 refills | Status: DC | PRN
Start: 2021-02-14 — End: 2021-10-02

## 2021-02-14 MED ORDER — FLUTICASONE PROPIONATE 50 MCG/ACT NA SUSP: 1.0000 | Freq: Every day | NASAL | 1 refills | Status: DC

## 2021-02-14 MED ORDER — ALBUTEROL SULFATE (2.5 MG/3ML) 0.083% IN NEBU: 2.5000 mg | INHALATION_SOLUTION | Freq: Four times a day (QID) | RESPIRATORY_TRACT | 1 refills | Status: DC | PRN

## 2021-02-14 NOTE — Patient Instructions (Signed)
   1. Continue to treat and prevent inflammation:   A. Flonase 1-2 sprays each nostril one time per day  B. Flovent 110 - 2 inhalations 1 time per day  C. Montelukast 10mg  one time per day  2.  Continue to treat and prevent reflux/LPR:     A. Famotidine 40 mg - 1 tablet 1 time per day  3. If needed:   A. Antihistamine   B. Proair HFA - 2 inhalations or Xopenex neb every 4-6 hours  C. Mucinex DM - 2 tablets two times per day  4. "Action Plan" for flare up:   A. Increase Flovent 110 to 3 inhalations 3 times per day  B. Add Nexium 40 mg twice a day if needed  5. Return to clinic in 12 months or earlier if problem

## 2021-02-14 NOTE — Progress Notes (Signed)
[facility] [place] [facility] [place] [facility] [place] [facility] [place]   Follow-up Note  Referring Provider: Laurann Montana, MD Primary Provider: Laurann Montana, MD Date of Office Visit: 02/14/2021  Subjective:   Brenda Daniel (DOB: [birth year]) is a 65 y.o. female who [facility] in re-evaluation of the following:  HPI: Brenda Daniel returns to this clinic in evaluation of asthma and allergic rhinitis and LPR.  Her last visit to this clinic was 29 December 2019.  Regarding her asthma, she has had excellent control of this condition while using Flovent at relatively low dose averaging out to 220 mcg/day on a preventative basis.  She has not required a systemic steroid to treat an exacerbation or activation of her action plan.  She can exercise without any problem and rarely uses a short acting bronchodilator.  She has had very little problems with her nose while using montelukast and some nasal steroid usually during the spring and fall.  Her reflux is under good control as long she remains on famotidine.  She has noticed that if she misses her famotidine she definitely does develop a little bit of a cough.  She will add Nexium as part of her action plan.  She has received 4 Pfizer COVID vaccinations and also contracted COVID infection in July 2022 treated with Paxlovid manifested as a very mild illness without any long-term sequela.  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  She has received the flu vaccine.  Allergies as of 02/14/2021       Reactions   Advil [ibuprofen] Other (See Comments)   Dizzy; any NSAIDS    Niacin And Related Other (See Comments)   dizzy   Red Yeast Rice [cholestin] Other (See Comments)   dizzy        Medication List    albuterol (2.5 MG/3ML) 0.083% nebulizer solution Commonly known as: PROVENTIL Take 3 mLs (2.5 mg total) by nebulization every 6 (six) hours as  needed for wheezing or shortness of breath.   albuterol 108 (90 Base) MCG/ACT inhaler Commonly known as: VENTOLIN HFA INHALE 2 PUFFS INTO THE LUNGS EVERY 4 (FOUR) HOURS AS NEEDED FOR WHEEZING OR SHORTNESS OF BREATH.   esomeprazole 40 MG capsule Commonly known as: NEXIUM TAKE 1 CAPSULE (40 MG TOTAL) BY MOUTH DAILY AS NEEDED (ACID REFLUX/COUGH).   famotidine 40 MG tablet Commonly known as: PEPCID CAN TAKE 1 TABLET BY MOUTH EVERY DAY IF NEEDED.   Flovent HFA 110 MCG/ACT inhaler Generic drug: fluticasone INHALE 2 PUFFS INTO THE LUNGS 2 (TWO) TIMES DAILY AS NEEDED (COUGH).   fluticasone 50 MCG/ACT nasal spray Commonly known as: FLONASE PLACE 1 SPRAY INTO BOTH NOSTRILS DAILY.   gelatin 650 MG capsule Take 650 mg by mouth See admin instructions. Mon - Fri   ipratropium-albuterol 0.5-2.5 (3) MG/3ML Soln Commonly known as: DUONEB Take 3 mLs by nebulization every 4 (four) hours as needed. What changed: reasons to take this   losartan 100 MG tablet Commonly known as: COZAAR Take 50 mg by mouth daily.   montelukast 10 MG tablet Commonly known as: SINGULAIR TAKE 1 TABLET BY MOUTH EVERYDAY AT BEDTIME   nitroGLYCERIN 0.4 MG SL tablet Commonly known as: NITROSTAT Place 1 tablet (0.4 mg total) under the tongue every 5 (five) minutes as needed for chest pain.   Potassium 99 MG Tabs Take 1 tablet by mouth at bedtime.   traZODone 50 MG  tablet Commonly known as: DESYREL Take 25 mg by mouth at bedtime.   triamterene-hydrochlorothiazide 75-50 MG tablet Commonly known as: MAXZIDE Take 1 tablet by mouth daily.   valACYclovir 500 MG tablet Commonly known as: VALTREX Take 500 mg by mouth See admin instructions. Take one tablet (500 mg) by mouth daily at bedtime, take four tablets (2000 mg) once as one dose as needed at onset of fever blister   VITAMIN D PO Take 1 tablet by mouth See admin instructions. Take one tablet by mouth daily on Monday thru Friday (skip Saturday and Sunday)     Past Medical History:  Diagnosis Date   Allergic rhinitis    per Dr. Lucie Leather   Asthma    per Dr. Lucie Leather   DJD (degenerative joint disease)    GERD (gastroesophageal reflux disease)    Hyperlipidemia    Hypertension     Past Surgical History:  Procedure Laterality Date   APPENDECTOMY     BRAVO PH STUDY N/A 03/16/2015   Procedure: BRAVO PH STUDY;  Surgeon: Charlott Rakes, MD;  Location: [facility];  Service: Endoscopy;  Laterality: N/A;   CESAREAN SECTION     ESOPHAGOGASTRODUODENOSCOPY N/A 03/16/2015   Procedure: ESOPHAGOGASTRODUODENOSCOPY (EGD);  Surgeon: Charlott Rakes, MD;  Location: [facility];  Service: Endoscopy;  Laterality: N/A;   IR ANGIO INTRA EXTRACRAN SEL COM CAROTID INNOMINATE BILAT MOD SED  04/24/2019   IR ANGIO VERTEBRAL SEL VERTEBRAL BILAT MOD SED  04/24/2019   IR US GUIDE VASC ACCESS RIGHT  04/24/2019   MYOMECTOMY     TEMPOROMANDIBULAR JOINT SURGERY Right    TONSILLECTOMY      Review of systems negative except as noted in HPI / PMHx or noted below:  Review of Systems  Constitutional: Negative.   HENT: Negative.    Eyes: Negative.   Respiratory: Negative.    Cardiovascular: Negative.   Gastrointestinal: Negative.   Genitourinary: Negative.   Musculoskeletal: Negative.   Skin: Negative.   Neurological: Negative.   Endo/Heme/Allergies: Negative.   Psychiatric/Behavioral: Negative.      Objective:   Vitals:   02/14/21 0910  BP: 132/82  Pulse: 75  Resp: 16  Temp: 98 F (36.7 C)  SpO2: 98%   Height: 5\' 3"  (160 cm)  Weight: 160 lb 12.8 oz (72.9 kg)   Physical Exam Constitutional:      Appearance: She is not diaphoretic.  HENT:     Head: Normocephalic.     Right Ear: Tympanic membrane, ear canal and external ear normal.     Left Ear: Tympanic membrane, ear canal and external ear normal.     Nose: Nose normal. No mucosal edema or rhinorrhea.     Mouth/Throat:     Pharynx: Uvula midline. No oropharyngeal exudate.  Eyes:      Conjunctiva/sclera: Conjunctivae normal.  Neck:     Thyroid: No thyromegaly.     Trachea: Trachea normal. No tracheal tenderness or tracheal deviation.  Cardiovascular:     Rate and Rhythm: Normal rate and regular rhythm.     Heart sounds: Normal heart sounds, S1 normal and S2 normal. No murmur heard. Pulmonary:     Effort: No respiratory distress.     Breath sounds: Normal breath sounds. No stridor. No wheezing or rales.  Lymphadenopathy:     Head:     Right side of head: No tonsillar adenopathy.     Left side of head: No tonsillar adenopathy.     Cervical: No cervical adenopathy.  Skin:  Findings: No erythema or rash.     Nails: There is no clubbing.  Neurological:     Mental Status: She is alert.    Diagnostics:    Spirometry was performed and demonstrated an FEV1 of 2.15 at 91 % of predicted.  Assessment and Plan:   1. Asthma, moderate persistent, well-controlled   2. Perennial allergic rhinitis   3. LPRD (laryngopharyngeal reflux disease)   4. Mild intermittent asthma with acute exacerbation     1. Continue to treat and prevent inflammation:   A. Flonase 1-2 sprays each nostril one time per day  B. Flovent 110 - 2 inhalations 1 time per day  C. Montelukast 10mg  one time per day  2.  Continue to treat and prevent reflux/LPR:     A. Famotidine 40 mg - 1 tablet 1 time per day  3. If needed:   A. Antihistamine   B. Proair HFA - 2 inhalations or Xopenex neb every 4-6 hours  C. Mucinex DM - 2 tablets two times per day  4. "Action Plan" for flare up:   A. Increase Flovent 110 to 3 inhalations 3 times per day  B. Add Nexium 40 mg twice a day if needed  5. Return to clinic in 12 months or earlier if problem  Brenda Daniel appears to be doing very well on her current therapy and she can continue to utilize anti-inflammatory agents for her airway at relatively low dose in a preventative manner and continue to treat her LPR with famotidine and of course activate a  "action plan" should she develop a respiratory tract flareup.  She has a very good understanding of her disease process and how her medications work and appropriate dosing of her medications depending on disease activity.  I will see her back in this clinic in 1 year or earlier if there is a problem.   Santina Evans, MD Allergy / Immunology [facility]

## 2021-02-15 ENCOUNTER — Encounter: Payer: Self-pay | Admitting: Allergy and Immunology

## 2021-03-07 ENCOUNTER — Encounter: Payer: Self-pay | Admitting: Orthopaedic Surgery

## 2021-03-07 ENCOUNTER — Other Ambulatory Visit: Payer: Self-pay

## 2021-03-07 ENCOUNTER — Ambulatory Visit (INDEPENDENT_AMBULATORY_CARE_PROVIDER_SITE_OTHER): Payer: No Typology Code available for payment source | Admitting: Orthopaedic Surgery

## 2021-03-07 DIAGNOSIS — M7701 Medial epicondylitis, right elbow: Secondary | ICD-10-CM | POA: Diagnosis not present

## 2021-03-07 DIAGNOSIS — M25521 Pain in right elbow: Secondary | ICD-10-CM

## 2021-03-07 MED ORDER — METHYLPREDNISOLONE ACETATE 40 MG/ML IJ SUSP
20.0000 mg | INTRAMUSCULAR | Status: AC | PRN
  Administered 2021-03-07: 20 mg

## 2021-03-07 MED ORDER — LIDOCAINE HCL 1 % IJ SOLN
1.0000 mL | INTRAMUSCULAR | Status: AC | PRN
  Administered 2021-03-07: 1 mL

## 2021-03-07 NOTE — Progress Notes (Signed)
Office Visit Note   Patient: Brenda Daniel           Date of Birth: Apr 24, 1956           MRN: [id number] Visit Date: 03/07/2021              Requested by: Laurann Montana, MD [phone] [name] [place],  [place] [address] PCP: Laurann Montana, MD   Assessment & Plan: Visit Diagnoses:  1. Pain in right elbow   2. Epicondylitis elbow, medial, right     Plan: Recurrent symptoms of medial epicondylitis right elbow.  Last had a cortisone injection in October 2020.  Recent onset of symptoms without numbness or tingling or radicular pain.  We will reinject the medial epicondyle and try a course of physical therapy  Follow-Up Instructions: Return if symptoms worsen or fail to improve.   Orders:  Orders Placed This Encounter  Procedures   Hand/UE Inj   Ambulatory referral to Physical Therapy   No orders of the defined types were placed in this encounter.     Procedures: Hand/UE Inj for medial epicondylitis on 03/07/2021 4:08 PM Details: 27 G needle, medial approach Medications: 1 mL lidocaine 1 %; 20 mg methylPREDNISolone acetate 40 MG/ML     Clinical Data: No additional findings.   Subjective: Chief Complaint  Patient presents with   Right Elbow - Pain  Patient presents today for recurrent right elbow pain. She received a right elbow injection 1 year ago. She said that it helped, but the pain returned a couple months ago. She said that it hurts with weightbearing, and will also wake her at night. She is right hand dominant. She is not taking anything for pain. She is not diabetic.   HPI  Review of Systems   Objective: Vital Signs: There were no vitals taken for this visit.  Physical Exam Constitutional:      Appearance: She is well-developed.  Eyes:     Pupils: Pupils are equal, round, and reactive to light.  Pulmonary:     Effort: Pulmonary effort is normal.  Skin:    General: Skin is warm and dry.  Neurological:     Mental Status: She is alert  and oriented to person, place, and time.  Psychiatric:        Behavior: Behavior normal.    Ortho Exam awake alert.  Right elbow was examined with full range of motion and local tenderness over the medial epicondyle.  No symptoms referable to the ulnar nerve.  Gait intact.  No erythema or ecchymosis.  No lateral pain or posterior discomfort.  Pain with grip mostly in flexion along the medial epicondyle  Specialty Comments:  No specialty comments available.  Imaging: No results found.   PMFS History: Patient Active Problem List   Diagnosis Date Noted   Epicondylitis elbow, medial, right 02/17/2020   Bilateral bunions 02/17/2020   Medial epicondylitis of elbow, left 03/03/2019   Arch pain, unspecified laterality 03/03/2019   Pain in right foot 03/03/2019   Cough 05/23/2018   Moderate persistent asthma with acute exacerbation 05/23/2018   Perennial allergic rhinitis 05/23/2018   Carpal tunnel syndrome, left upper limb 02/18/2017   Pain in left hip 04/23/2016   Pain in both upper extremities 04/23/2016   Mild intermittent asthma with acute exacerbation 06/01/2015   LPRD (laryngopharyngeal reflux disease) 06/01/2015   Allergic rhinoconjunctivitis 06/01/2015   Morbid obesity (HCC) 04/02/2015   GERD (gastroesophageal reflux disease) 03/16/2015   Upper airway  cough syndrome 03/16/2015   Past Medical History:  Diagnosis Date   Allergic rhinitis    per Dr. Lucie Leather   Asthma    per Dr. Lucie Leather   DJD (degenerative joint disease)    GERD (gastroesophageal reflux disease)    Hyperlipidemia    Hypertension     Family History  Problem Relation Age of Onset   Heart disease Father    Heart attack Father    Stroke Father    Allergies Sister    Eczema Sister    Asthma Sister    Hypertension Sister    Hypertension Other        x 4 siblings   Heart attack Brother    Hypertension Brother    Heart attack Paternal Grandfather    Hypertension Sister     Past Surgical History:   Procedure Laterality Date   APPENDECTOMY     BRAVO PH STUDY N/A 03/16/2015   Procedure: BRAVO PH STUDY;  Surgeon: Charlott Rakes, MD;  Location: [facility];  Service: Endoscopy;  Laterality: N/A;   CESAREAN SECTION     ESOPHAGOGASTRODUODENOSCOPY N/A 03/16/2015   Procedure: ESOPHAGOGASTRODUODENOSCOPY (EGD);  Surgeon: Charlott Rakes, MD;  Location: [facility];  Service: Endoscopy;  Laterality: N/A;   IR ANGIO INTRA EXTRACRAN SEL COM CAROTID INNOMINATE BILAT MOD SED  04/24/2019   IR ANGIO VERTEBRAL SEL VERTEBRAL BILAT MOD SED  04/24/2019   IR US GUIDE VASC ACCESS RIGHT  04/24/2019   MYOMECTOMY     TEMPOROMANDIBULAR JOINT SURGERY Right    TONSILLECTOMY     Social History   Occupational History   Occupation: judge  Tobacco Use   Smoking status: Never   Smokeless tobacco: Never  Vaping Use   Vaping Use: Never used  Substance and Sexual Activity   Alcohol use: Yes    Alcohol/week: 0.0 standard drinks    Comment: glass a wine a day   Drug use: No   Sexual activity: Yes

## 2021-03-16 ENCOUNTER — Encounter: Payer: Self-pay | Admitting: Internal Medicine

## 2021-03-16 ENCOUNTER — Other Ambulatory Visit: Payer: Self-pay

## 2021-03-16 ENCOUNTER — Ambulatory Visit (INDEPENDENT_AMBULATORY_CARE_PROVIDER_SITE_OTHER): Payer: No Typology Code available for payment source | Admitting: Internal Medicine

## 2021-03-16 VITALS — BP 136/84 | HR 83 | Ht 63.0 in | Wt 160.4 lb

## 2021-03-16 DIAGNOSIS — I10 Essential (primary) hypertension: Secondary | ICD-10-CM | POA: Diagnosis not present

## 2021-03-16 DIAGNOSIS — R42 Dizziness and giddiness: Secondary | ICD-10-CM

## 2021-03-16 DIAGNOSIS — J4541 Moderate persistent asthma with (acute) exacerbation: Secondary | ICD-10-CM

## 2021-03-16 DIAGNOSIS — E785 Hyperlipidemia, unspecified: Secondary | ICD-10-CM

## 2021-03-16 DIAGNOSIS — H349 Unspecified retinal vascular occlusion: Secondary | ICD-10-CM

## 2021-03-16 DIAGNOSIS — R7303 Prediabetes: Secondary | ICD-10-CM

## 2021-03-16 NOTE — Patient Instructions (Signed)

## 2021-03-16 NOTE — Progress Notes (Signed)
Cardiology Office Note:    Date:  10/29/2019   ID:  STEELE LEDONNE, DOB Dec 07, 1955, MRN [id number]  PCP:  Laurann Montana, MD  Cardiologist:  None  Electrophysiologist:  None   Referring MD: Laurann Montana, MD   Chief Complaint: cardiovascular follow up  History of Present Illness:    Brenda Daniel is a 65 y.o. female with a history of hypertension, hyperlipidemia, prediabetes, GERD. She is coming in today for 1-year follow-up. She works as a Education administrator.   Today, she has been doing well.  Otherwise, she does not have any major concerns.  We discussed her EKG results and medications. Pepcid helps her manage coughing from acid reflux and she has not needed to take Nexium. She has not needed her NTG. Recently, she has not experienced recent asthma attacks, therefore, has not needed to take albuterol. She continues to take potassium for bilateral feet cramps. These cramps occurred prior to taking triam-HCTZ but they may have worsened with this medication. She is compliant in taking losartan. She is not currently on a cholesterol medicine because she is concerned about adverse reactions affecting her joints.   She had COVID in July this past year. During the infection, she did not feel sick but did report fatigue.  To manage her cholesterol, she made dietary changes and lost some weight. For exercise, she enjoys yoga.  The patient denies chest pain, chest pressure, dyspnea at rest or with exertion, PND, orthopnea, or leg swelling. Denies cough, fever, chills, nausea, or vomiting. Denies syncope, presyncope, or snoring. Denies dizziness or lightheadedness.   Past Medical History:  Diagnosis Date   Allergic rhinitis    per Dr. Lucie Leather   Asthma    per Dr. Lucie Leather   DJD (degenerative joint disease)    GERD (gastroesophageal reflux disease)    Hyperlipidemia    Hypertension     Past Surgical History:  Procedure Laterality Date   APPENDECTOMY     BRAVO PH STUDY N/A 03/16/2015    Procedure: BRAVO PH STUDY;  Surgeon: Charlott Rakes, MD;  Location: [facility];  Service: Endoscopy;  Laterality: N/A;   CESAREAN SECTION     ESOPHAGOGASTRODUODENOSCOPY N/A 03/16/2015   Procedure: ESOPHAGOGASTRODUODENOSCOPY (EGD);  Surgeon: Charlott Rakes, MD;  Location: [facility];  Service: Endoscopy;  Laterality: N/A;   IR ANGIO INTRA EXTRACRAN SEL COM CAROTID INNOMINATE BILAT MOD SED  04/24/2019   IR ANGIO VERTEBRAL SEL VERTEBRAL BILAT MOD SED  04/24/2019   IR US GUIDE VASC ACCESS RIGHT  04/24/2019   MYOMECTOMY     TEMPOROMANDIBULAR JOINT SURGERY Right    TONSILLECTOMY      Current Medications: Current Meds  Medication Sig   Cholecalciferol (VITAMIN D PO) Take 1 tablet by mouth See admin instructions. Take one tablet by mouth daily on Monday thru Friday (skip Saturday and Sunday)   esomeprazole (NEXIUM) 40 MG capsule Take 1 capsule (40 mg total) by mouth 2 (two) times daily before a meal. (Patient taking differently: Take 40 mg by mouth as needed.)   famotidine (PEPCID) 40 MG tablet CAN TAKE 1 TABLET BY MOUTH EVERY DAY IF NEEDED.   fluticasone (FLONASE) 50 MCG/ACT nasal spray Place 1 spray into both nostrils daily.   fluticasone (FLOVENT HFA) 110 MCG/ACT inhaler Inhale 2 puffs into the lungs 2 (two) times daily as needed (Increase to three inhalations three times a day during flare ups).   gelatin 650 MG capsule Take 650 mg by mouth See admin instructions. Mon - Fri  losartan (COZAAR) 50 MG tablet Take 50 mg by mouth daily.   nitroGLYCERIN (NITROSTAT) 0.4 MG SL tablet Place 1 tablet (0.4 mg total) under the tongue every 5 (five) minutes as needed for chest pain. (Patient taking differently: Place 0.4 mg under the tongue every 5 (five) minutes as needed for chest pain. Has never had to use before, but patient does have some on hand.)   Potassium 99 MG TABS Take 1 tablet by mouth at bedtime.    traZODone (DESYREL) 50 MG tablet Take 25 mg by mouth at bedtime.    triamterene-hydrochlorothiazide (MAXZIDE) 75-50 MG tablet Take 1 tablet by mouth daily.   valACYclovir (VALTREX) 500 MG tablet Take 500 mg by mouth See admin instructions. Take one tablet (500 mg) by mouth daily at bedtime, take four tablets (2000 mg) once as one dose as needed at onset of fever blister     Allergies:   Advil [ibuprofen], Niacin and related, and Red yeast rice [cholestin]   Social History   Socioeconomic History   Marital status: Married    Spouse name: Not on file   Number of children: 2   Years of education: Not on file   Highest education level: Not on file  Occupational History   Occupation: judge  Tobacco Use   Smoking status: Never   Smokeless tobacco: Never  Vaping Use   Vaping Use: Never used  Substance and Sexual Activity   Alcohol use: Yes    Alcohol/week: 0.0 standard drinks    Comment: glass a wine a day   Drug use: No   Sexual activity: Yes  Other Topics Concern   Not on file  Social History Narrative   Not on file   Social Determinants of Health   Financial Resource Strain: Not on file  Food Insecurity: Not on file  Transportation Needs: Not on file  Physical Activity: Not on file  Stress: Not on file  Social Connections: Not on file     Family History: The patient's family history includes Allergies in her sister; Asthma in her sister; Eczema in her sister; Heart attack in her brother, father, and paternal grandfather; Heart disease in her father; Hypertension in her brother, sister, sister, and another family member; Stroke in her father.  ROS:   Please see the history of present illness.    (+) Bilateral feet cramps All other systems reviewed and are negative.  EKGs/Labs/Other Studies Reviewed:    The following studies were reviewed today: Carotid Arterial Duplex 10/20/19 Right Carotid: Velocities in the right ICA are consistent with a 1-39%  stenosis.  Left Carotid: Velocities in the left ICA are consistent with a 1-39%   stenosis.  Vertebrals:  Bilateral vertebral arteries demonstrate antegrade flow.  Subclavians: Normal flow hemodynamics were seen in bilateral subclavian arteries.   Echo 07/21/19  1. Left ventricular ejection fraction, by estimation, is 65 to 70%. The  left ventricle has normal function. The left ventricle has no regional  wall motion abnormalities. Left ventricular diastolic parameters are  consistent with Grade I diastolic  dysfunction (impaired relaxation). The average left ventricular global  longitudinal strain is -23.1 %.   2. Right ventricular systolic function is normal. The right ventricular  size is normal.   3. The mitral valve is normal in structure and function. Trivial mitral  valve regurgitation.   4. The aortic valve is tricuspid. Aortic valve regurgitation is not  visualized.   5. The inferior vena cava is normal in size with greater  than 50%  respiratory variability, suggesting right atrial pressure of 3 mmHg.   Monitor 05/25/19 Indication: dizziness Minimum HR (bpm): 62 Maximum HR (bpm): 136 Supraventricular Ectopy: <1%  Ventricular Ectopy: <1% Ventricular Tachycardia: none Pauses: none AV block: none Atrial fibrillation: none Diary events: none IMPRESSION: Infrequent ectopy, no pauses or atrial fibrillation.   CT Coronary Morph 05/08/18 Coronary calcium score: The patient's coronary artery calcium score is 0, which places the patient in the 0 percentile. Coronary arteries: Normal coronary origins.  Right dominance. Right Coronary Artery: No detectable plaque or stenosis. Patent PDA and posterolateral artery. Left Main Coronary Artery: No detectable plaque or stenosis. Left Anterior Descending Coronary Artery: Minimal atherosclerotic plaque in the proximal LAD (<25% stenosis). Patent diagonal branches. Left Circumflex Artery: Patent small caliber left circumflex artery without detectable plaque or stenosis. Aorta:  Normal size.  No calcifications.  No  dissection. Aortic Valve: No calcifications. Other findings: Normal pulmonary vein drainage into the left atrium. Normal left atrial appendage without a thrombus. Normal size of the pulmonary artery. IMPRESSION: 1. Coronary calcium score of 0. This was 0 percentile for age and sex matched control. 2. Normal coronary origin with right dominance. 3. Minimal CAD in the proximal LAD, CADRADS = 1.  EKG:  03/16/21: NSR, rate 83 bpm 10/29/19: NSR, nonspecific T wave abnormality.   Recent Labs: No results found for requested labs within last 8760 hours.  Recent Lipid Panel No results found for: CHOL, TRIG, HDL, CHOLHDL, VLDL, LDLCALC, LDLDIRECT  Physical Exam:    VS:  BP 136/84   Pulse 83   Ht 5\' 3"  (1.6 m)   Wt 160 lb 6.4 oz (72.8 kg)   SpO2 99%   BMI 28.41 kg/m     Wt Readings from Last 5 Encounters:  03/16/21 160 lb 6.4 oz (72.8 kg)  02/14/21 160 lb 12.8 oz (72.9 kg)  02/17/20 163 lb (73.9 kg)  10/29/19 178 lb 3.2 oz (80.8 kg)  06/30/19 189 lb 9.6 oz (86 kg)     Constitutional: No acute distress Eyes: sclera non-icteric, normal conjunctiva and lids ENMT: normal dentition, moist mucous membranes Cardiovascular: regular rhythm, normal rate, no murmurs. S1 and S2 normal. Radial pulses normal bilaterally. No jugular venous distention.  Respiratory: clear to auscultation bilaterally GI : normal bowel sounds, soft and nontender. No distention.   MSK: extremities warm, well perfused. No edema.  NEURO: grossly nonfocal exam, moves all extremities. PSYCH: alert and oriented x 3, normal mood and affect.   ASSESSMENT:    1. Essential hypertension   2. Dizziness   3. Moderate persistent asthma with acute exacerbation   4. Retinal vascular occlusion of left eye   5. Hyperlipidemia, unspecified hyperlipidemia type   6. Prediabetes     PLAN:    Retinal vascular occlusion of left eye - Plan: EKG 12-Lead - mild carotid artery disease on carotid Doppler. Monitor unremarkable. -  lipids well controlled on last check 10/2020 - No definite intracardiac source of embolism.   Dizziness - stable. Unremarkable cardiac monitor and echocardiogram.   Essential hypertension - stable, continue losartan 50 mg daily, triamterene HCTZ 75-50 mg.   Hyperlipidemia, unspecified hyperlipidemia type - lipids well conrolled, elevated HDL, otherwise well controlled LDL and Trig.  Prediabetes - diet controlled. HbA1c 5.8%  Total time of encounter: 30 minutes total time of encounter, including 20 minutes spent in face-to-face patient care on the date of this encounter. This time includes coordination of care and counseling regarding above mentioned problem list.  Remainder of non-face-to-face time involved reviewing chart documents/testing relevant to the patient encounter and documentation in the medical record. I have independently reviewed documentation from referring provider.   Weston Brass, MD, [facility]  [facility]   Medication Adjustments/Labs and Tests Ordered: Current medicines are reviewed at length with the patient today.  Concerns regarding medicines are outlined above.  Orders Placed This Encounter  Procedures   EKG 12-Lead    No orders of the defined types were placed in this encounter.    Patient Instructions  Medication Instructions:  No Changes In Medications at this time.  *If you need a refill on your cardiac medications before your next appointment, please call your pharmacy*  Follow-Up: At [facility], you and your health needs are our priority.  As part of our continuing mission to provide you with exceptional heart care, we have created designated Provider Care Teams.  These Care Teams include your primary Cardiologist (physician) and Advanced Practice Providers (APPs -  Physician Assistants and Nurse Practitioners) who all work together to provide you with the care you need, when you need it.  Your next appointment:   1 year(s)  The  format for your next appointment:   In Person  Provider:   Weston Brass, MD   Sherin Quarry as a scribe for Parke Poisson, MD.,have documented all relevant documentation on the behalf of Parke Poisson, MD,as directed by  Parke Poisson, MD while in the presence of Parke Poisson, MD.  I, Parke Poisson, MD, have reviewed all documentation for this visit. The documentation on today's date of service for the exam, diagnosis, procedures, and orders are all accurate and complete.

## 2021-03-17 ENCOUNTER — Ambulatory Visit: Payer: No Typology Code available for payment source | Admitting: Rehabilitative and Restorative Service Providers"

## 2021-03-24 ENCOUNTER — Ambulatory Visit (INDEPENDENT_AMBULATORY_CARE_PROVIDER_SITE_OTHER): Payer: No Typology Code available for payment source | Admitting: Rehabilitative and Restorative Service Providers"

## 2021-03-24 ENCOUNTER — Other Ambulatory Visit: Payer: Self-pay

## 2021-03-24 ENCOUNTER — Encounter: Payer: Self-pay | Admitting: Rehabilitative and Restorative Service Providers"

## 2021-03-24 DIAGNOSIS — R600 Localized edema: Secondary | ICD-10-CM

## 2021-03-24 DIAGNOSIS — M6281 Muscle weakness (generalized): Secondary | ICD-10-CM

## 2021-03-24 DIAGNOSIS — M25631 Stiffness of right wrist, not elsewhere classified: Secondary | ICD-10-CM | POA: Diagnosis not present

## 2021-03-24 DIAGNOSIS — M25521 Pain in right elbow: Secondary | ICD-10-CM

## 2021-03-24 NOTE — Therapy (Signed)
[facility] Physical Therapy [address] [place], [place], [address] Phone: [phone]   Fax:  [phone]  Physical Therapy Evaluation  Patient Details  Name: Brenda Daniel MRN: [id number] Date of Birth: May 13, 1956 Referring Provider (PT): Valeria Batman MD   Encounter Date: 03/24/2021   PT End of Session - 03/24/21 1712     Visit Number 1    Number of Visits 8    Date for PT Re-Evaluation 05/05/21    PT Start Time 1345    PT Stop Time 1425    PT Time Calculation (min) 40 min    Activity Tolerance Patient tolerated treatment well;No increased pain    Behavior During Therapy WFL for tasks assessed/performed             Past Medical History:  Diagnosis Date   Allergic rhinitis    per Dr. Lucie Leather   Asthma    per Dr. Lucie Leather   DJD (degenerative joint disease)    GERD (gastroesophageal reflux disease)    Hyperlipidemia    Hypertension     Past Surgical History:  Procedure Laterality Date   APPENDECTOMY     BRAVO PH STUDY N/A 03/16/2015   Procedure: BRAVO PH STUDY;  Surgeon: Charlott Rakes, MD;  Location: [facility];  Service: Endoscopy;  Laterality: N/A;   CESAREAN SECTION     ESOPHAGOGASTRODUODENOSCOPY N/A 03/16/2015   Procedure: ESOPHAGOGASTRODUODENOSCOPY (EGD);  Surgeon: Charlott Rakes, MD;  Location: [facility];  Service: Endoscopy;  Laterality: N/A;   IR ANGIO INTRA EXTRACRAN SEL COM CAROTID INNOMINATE BILAT MOD SED  04/24/2019   IR ANGIO VERTEBRAL SEL VERTEBRAL BILAT MOD SED  04/24/2019   IR US GUIDE VASC ACCESS RIGHT  04/24/2019   MYOMECTOMY     TEMPOROMANDIBULAR JOINT SURGERY Right    TONSILLECTOMY      There were no vitals filed for this visit.    Subjective Assessment - 03/24/21 1709     Subjective Elona has had off and on battles with golfers elbow.  She has had 2-3 shots of cortisone and wants to get stronger so she can return to working out without flaring-up her elbow.    Pertinent History B carpal tunnel  release    Limitations Lifting;House hold activities    Patient Stated Goals Return to workouts and normal activity without medial elbow pain.    Currently in Pain? Yes    Pain Score 4     Pain Location Elbow    Pain Orientation Right;Medial    Pain Descriptors / Indicators Aching;Constant;Sore    Pain Type Chronic pain    Pain Radiating Towards NA    Pain Onset More than a month ago    Pain Frequency Constant    Aggravating Factors  Repetitive use and WB with Yoga    Pain Relieving Factors Rest    Effect of Pain on Daily Activities Has to avoid overuse and modify workouts    Multiple Pain Sites No                [facility] PT Assessment - 03/24/21 0001       Assessment   Medical Diagnosis R medial epicondylitis elbow    Referring Provider (PT) Valeria Batman MD    Onset Date/Surgical Date --   Chronic   Hand Dominance Right    Prior Therapy 2 (maybe 3) cortisone shots      Precautions   Precautions None      Restrictions   Weight Bearing Restrictions Yes  Other Position/Activity Restrictions R UE      Balance Screen   Has the patient fallen in the past 6 months No    Has the patient had a decrease in activity level because of a fear of falling?  No    Is the patient reluctant to leave their home because of a fear of falling?  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    Leisure Workouts      Cognition   Overall Cognitive Status Within Functional Limits for tasks assessed      Observation/Other Assessments   Focus on Therapeutic Outcomes (FOTO)  51 (Goal 67 in 7 visits)      ROM / Strength   AROM / PROM / Strength AROM;Strength      AROM   Overall AROM  Deficits    AROM Assessment Site Elbow;Wrist    Right/Left Elbow Left;Right    Right Elbow Flexion 139    Right Elbow Extension -14    Left Elbow Flexion 135    Left Elbow Extension -12    Right/Left Wrist Left    Right Wrist Extension 50 Degrees     Right Wrist Flexion 65 Degrees    Left Wrist Extension 55 Degrees    Left Wrist Flexion 70 Degrees      Strength   Overall Strength Comments Grip (L/R in pounds): 60.4/58.8 pounds                        Objective measurements completed on examination: See above findings.       [facility] Adult PT Treatment/Exercise - 03/24/21 0001       Exercises   Exercises Wrist;Hand      Wrist Exercises   Wrist Flexion AAROM;Right;10 reps;Seated;Limitations    Wrist Flexion Limitations 10 seconds (stretch extensors with elbow straight)    Wrist Extension AAROM;Right;10 reps;Seated;Limitations    Wrist Extension Limitations 10 seconds (include thumb with elbow straight) stretch into extension    Other wrist exercises Drop eccentrics with 3# for wrist flexors (palm up, slowly eccentric lower into extension, L side brings up to flexion) 2 sets of 20                     PT Education - 03/24/21 1711     Education Details Reviewed exam findings, education on avoiding repetitive grip and grasping, started day 1 HEP.    Person(s) Educated Patient    Methods Explanation;Demonstration;Verbal cues;Handout    Comprehension Verbalized understanding;Returned demonstration;Need further instruction;Verbal cues required              PT Short Term Goals - 03/24/21 1716       PT SHORT TERM GOAL #1   Title Mckenlee will have R wrist AROM = to that of the L.    Baseline R is stiffer (see objective).    Time 4    Period Weeks    Status New    Target Date 04/21/21               PT Long Term Goals - 03/24/21 1716       PT LONG TERM GOAL #1   Title Improve FOTO to 67.    Baseline 51    Time 6    Period Weeks    Status New    Target Date 05/05/21      PT LONG  TERM GOAL #2   Title Improve R grip strength to 70+ pounds.    Baseline < 60 pounds    Time 6    Period Weeks    Status New    Target Date 05/05/21      PT LONG TERM GOAL #3   Title Keron will  report R medial elbow pain as consistently 0-2/10 on the Numeric Pain Rating Scale.    Baseline Affects workouts so 4+/10    Time 6    Period Weeks    Status New    Target Date 05/05/21      PT LONG TERM GOAL #4   Title Meral will be independent and compliant with her long-term HEP.    Baseline Assigned to day    Time 6    Period Weeks    Status New    Target Date 05/05/21                    Plan - 03/24/21 1712     Clinical Impression Statement Khloei has chronic off and on R medial epicondylitis.  She has had her allotment of cortisone and will benefit from strength and flexibility work while avoiding overuse for a long-term solution.  Tight wrists (previous CT surgery) and weak grip will be addressed.  Her prognosis is good with the recommended POC.    Personal Factors and Comorbidities Comorbidity 1;Time since onset of injury/illness/exacerbation    Comorbidities Previous carpal tunnel releases    Examination-Participation Restrictions Community Activity;Occupation    Stability/Clinical Decision Making Stable/Uncomplicated    Clinical Decision Making Low    Rehab Potential Good    PT Frequency --   1-2X/week   PT Duration 6 weeks    PT Treatment/Interventions ADLs/Self Care Home Management;Electrical Stimulation;Cryotherapy;Iontophoresis 4mg /ml Dexamethasone;Ultrasound;Therapeutic activities;Neuromuscular re-education;Therapeutic exercise;Patient/family education;Manual techniques;Dry needling;Vasopneumatic Device    PT Next Visit Plan Review wrist flexors and extensors stretching, progress forearm and general UE strength while avoiding overuse.    PT Home Exercise Plan Access Code: [email or web address]             Patient will benefit from skilled therapeutic intervention in order to improve the following deficits and impairments:  Decreased endurance, Decreased range of motion, Decreased strength, Increased edema, Impaired UE functional use, Pain  Visit  Diagnosis: Muscle weakness (generalized)  Stiffness of right wrist, not elsewhere classified  Pain in right elbow  Localized edema     Problem List Patient Active Problem List   Diagnosis Date Noted   Epicondylitis elbow, medial, right 02/17/2020   Bilateral bunions 02/17/2020   Medial epicondylitis of elbow, left 03/03/2019   Arch pain, unspecified laterality 03/03/2019   Pain in right foot 03/03/2019   Cough 05/23/2018   Moderate persistent asthma with acute exacerbation 05/23/2018   Perennial allergic rhinitis 05/23/2018   Carpal tunnel syndrome, left upper limb 02/18/2017   Pain in left hip 04/23/2016   Pain in both upper extremities 04/23/2016   Mild intermittent asthma with acute exacerbation 06/01/2015   LPRD (laryngopharyngeal reflux disease) 06/01/2015   Allergic rhinoconjunctivitis 06/01/2015   Morbid obesity (HCC) 04/02/2015   GERD (gastroesophageal reflux disease) 03/16/2015   Upper airway cough syndrome 03/16/2015    03/18/2015, PT, MPT 03/24/2021, 5:21 PM  [facility] Physical Therapy [address] [place], [place], [place] Phone: [phone]   Fax:  [phone]  Name: ARSENIA GORACKE MRN: Magda Bernheim Date of Birth: [birth year]

## 2021-03-24 NOTE — Patient Instructions (Signed)
Access Code: [email or web address] URL: [email or web address] Date: 03/24/2021 Prepared by: Pauletta Browns  Exercises Wrist Flexion AROM - 2 x daily - 7 x weekly - 2 sets - 20 reps Seated Wrist Flexion Stretch - 2 x daily - 7 x weekly - 1 sets - 10 reps - 10 seconds hold Seated Wrist Extension Stretch - 2 x daily - 7 x weekly - 1 sets - 10 reps - 10 seconds hold

## 2021-03-27 ENCOUNTER — Encounter: Payer: Self-pay | Admitting: Physical Therapy

## 2021-03-27 ENCOUNTER — Ambulatory Visit (INDEPENDENT_AMBULATORY_CARE_PROVIDER_SITE_OTHER): Payer: No Typology Code available for payment source | Admitting: Physical Therapy

## 2021-03-27 ENCOUNTER — Other Ambulatory Visit: Payer: Self-pay

## 2021-03-27 DIAGNOSIS — M6281 Muscle weakness (generalized): Secondary | ICD-10-CM | POA: Diagnosis not present

## 2021-03-27 DIAGNOSIS — M25521 Pain in right elbow: Secondary | ICD-10-CM | POA: Diagnosis not present

## 2021-03-27 DIAGNOSIS — M25631 Stiffness of right wrist, not elsewhere classified: Secondary | ICD-10-CM | POA: Diagnosis not present

## 2021-03-27 DIAGNOSIS — R600 Localized edema: Secondary | ICD-10-CM | POA: Diagnosis not present

## 2021-03-27 NOTE — Therapy (Signed)
[facility] Physical Therapy [address] [place], [place], [address] Phone: [phone]   Fax:  [phone]  Physical Therapy Treatment  Patient Details  Name: Brenda Daniel MRN: [id number] Date of Birth: 1955-12-19 Referring Provider (PT): Valeria Batman MD   Encounter Date: 03/27/2021   PT End of Session - 03/27/21 0801     Visit Number 2    Number of Visits 8    Date for PT Re-Evaluation 05/05/21    PT Start Time 0801    PT Stop Time 0846    PT Time Calculation (min) 45 min    Activity Tolerance Patient tolerated treatment well;No increased pain    Behavior During Therapy WFL for tasks assessed/performed             Past Medical History:  Diagnosis Date   Allergic rhinitis    per Dr. Lucie Leather   Asthma    per Dr. Lucie Leather   DJD (degenerative joint disease)    GERD (gastroesophageal reflux disease)    Hyperlipidemia    Hypertension     Past Surgical History:  Procedure Laterality Date   APPENDECTOMY     BRAVO PH STUDY N/A 03/16/2015   Procedure: BRAVO PH STUDY;  Surgeon: Charlott Rakes, MD;  Location: [facility];  Service: Endoscopy;  Laterality: N/A;   CESAREAN SECTION     ESOPHAGOGASTRODUODENOSCOPY N/A 03/16/2015   Procedure: ESOPHAGOGASTRODUODENOSCOPY (EGD);  Surgeon: Charlott Rakes, MD;  Location: [facility];  Service: Endoscopy;  Laterality: N/A;   IR ANGIO INTRA EXTRACRAN SEL COM CAROTID INNOMINATE BILAT MOD SED  04/24/2019   IR ANGIO VERTEBRAL SEL VERTEBRAL BILAT MOD SED  04/24/2019   IR US GUIDE VASC ACCESS RIGHT  04/24/2019   MYOMECTOMY     TEMPOROMANDIBULAR JOINT SURGERY Right    TONSILLECTOMY      There were no vitals filed for this visit.   Subjective Assessment - 03/27/21 0802     Subjective had some pain with stretch after she had done wrist flex    Patient Stated Goals Return to workouts and normal activity without medial elbow pain.    Currently in Pain? No/denies                                [facility] Adult PT Treatment/Exercise - 03/27/21 0001       Wrist Exercises   Wrist Flexion Limitations Access Code: [email or web address]  URL: [email or web address]  Date: 03/27/2021  Prepared by: Brenda Daniel    Exercises  Wrist Flexion AROM - 2 x daily - 7 x weekly - 2 sets - 20 reps  Seated Wrist Flexion Stretch - 2 x daily - 7 x weekly - 1 sets - 10 reps - 10 seconds hold  Seated Wrist Extension Stretch - 2 x daily - 7 x weekly - 1 sets - 10 reps - 10 seconds hold  Bent Over Single Arm Shoulder Row with Dumbbell - 1 x daily - 3 x weekly - 1-3 sets - 10 reps  Standing Shoulder Extension - 1 x daily - 3 x weekly - 1-3 sets - 10 reps    Wrist Extension AAROM;Right;10 reps;Seated;Limitations    Wrist Extension Limitations modified to bil WB with small dynamic ROM   more tolerable to pt   Other wrist exercises 3# elbow at 90 deg supinated; extend elbow and return 3sec out/3sec hold/3 sec back x 8 (fatigued); reviewed drop eccentrics x 10    Other wrist  exercises isometric 45 sec hold x5 using 6# wt in 90 deg elbow flex and forearm sup   2 min rest in between doing rows 6#. shoulder ext 6#, bent over Ts 3#                    PT Education - 03/27/21 0934     Education Details HEP modified and progressed    Person(s) Educated Patient    Methods Explanation;Demonstration;Verbal cues;Handout    Comprehension Verbalized understanding;Returned demonstration              PT Short Term Goals - 03/24/21 1716       PT SHORT TERM GOAL #1   Title Kayanna will have R wrist AROM = to that of the L.    Baseline R is stiffer (see objective).    Time 4    Period Weeks    Status New    Target Date 04/21/21               PT Long Term Goals - 03/24/21 1716       PT LONG TERM GOAL #1   Title Improve FOTO to 67.    Baseline 51    Time 6    Period Weeks    Status New    Target Date 05/05/21      PT LONG TERM GOAL #2   Title Improve R grip  strength to 70+ pounds.    Baseline < 60 pounds    Time 6    Period Weeks    Status New    Target Date 05/05/21      PT LONG TERM GOAL #3   Title Brenda Daniel will report R medial elbow pain as consistently 0-2/10 on the Numeric Pain Rating Scale.    Baseline Affects workouts so 4+/10    Time 6    Period Weeks    Status New    Target Date 05/05/21      PT LONG TERM GOAL #4   Title Brenda Daniel will be independent and compliant with her long-term HEP.    Baseline Assigned to day    Time 6    Period Weeks    Status New    Target Date 05/05/21                   Plan - 03/27/21 0854     Clinical Impression Statement Patient ind with initial HEP, however she reported difficulty with wrist ext stretch so this was modified to bil WB stretch. Patient tolerated all TE without elbow pain. HEP progressed with heavy isometrics and tempo activities.    Personal Factors and Comorbidities Comorbidity 1;Time since onset of injury/illness/exacerbation    Comorbidities Previous carpal tunnel releases    PT Duration 6 weeks    PT Treatment/Interventions ADLs/Self Care Home Management;Electrical Stimulation;Cryotherapy;Iontophoresis 4mg /ml Dexamethasone;Ultrasound;Therapeutic activities;Neuromuscular re-education;Therapeutic exercise;Patient/family education;Manual techniques;Dry needling;Vasopneumatic Device    PT Next Visit Plan Review wrist flexors and extensors stretching, progress forearm and general UE strength while avoiding overuse.    PT Home Exercise Plan Access Code: [email or web address]    Consulted and Agree with Plan of Care Patient             Patient will benefit from skilled therapeutic intervention in order to improve the following deficits and impairments:  Decreased endurance, Decreased range of motion, Decreased strength, Increased edema, Impaired UE functional use, Pain  Visit Diagnosis: Pain in right elbow  Stiffness of right wrist, not elsewhere classified  Muscle weakness  (generalized)  Localized edema     Problem List Patient Active Problem List   Diagnosis Date Noted   Epicondylitis elbow, medial, right 02/17/2020   Bilateral bunions 02/17/2020   Medial epicondylitis of elbow, left 03/03/2019   Arch pain, unspecified laterality 03/03/2019   Pain in right foot 03/03/2019   Cough 05/23/2018   Moderate persistent asthma with acute exacerbation 05/23/2018   Perennial allergic rhinitis 05/23/2018   Carpal tunnel syndrome, left upper limb 02/18/2017   Pain in left hip 04/23/2016   Pain in both upper extremities 04/23/2016   Mild intermittent asthma with acute exacerbation 06/01/2015   LPRD (laryngopharyngeal reflux disease) 06/01/2015   Allergic rhinoconjunctivitis 06/01/2015   Morbid obesity (HCC) 04/02/2015   GERD (gastroesophageal reflux disease) 03/16/2015   Upper airway cough syndrome 03/16/2015    Solon Palm, PT 03/27/2021, 9:35 AM  [facility] Physical Therapy [address] [place], [place], [address] Phone: [phone]   Fax:  [phone]  Name: Brenda Daniel MRN: [id number] Date of Birth: [birth year]

## 2021-03-27 NOTE — Patient Instructions (Addendum)
Access Code: [email or web address] URL: [email or web address] Date: 03/27/2021 Prepared by: Raynelle Fanning  Exercises Wrist Flexion AROM - 2 x daily - 7 x weekly - 2 sets - 20 reps Seated Wrist Flexion Stretch - 2 x daily - 7 x weekly - 1 sets - 10 reps - 10 seconds hold Seated Wrist Extension Stretch - 2 x daily - 7 x weekly - 1 sets - 10 reps - 10 seconds hold Bent Over Single Arm Shoulder Row with Dumbbell - 1 x daily - 3 x weekly - 1-3 sets - 10 reps Standing Shoulder Extension - 1 x daily - 3 x weekly - 1-3 sets - 10 reps  Isometric using 5-6# with elbow at 90 deg and forearm supinated. 45 sec hold x 5 reps; rest in between Tempo: elbow at 90 deg 2-3# wt: extend arm forward in 3 sec, hold 3 sec, return in 3 sec; 1-3 sets of 10

## 2021-03-29 ENCOUNTER — Ambulatory Visit (INDEPENDENT_AMBULATORY_CARE_PROVIDER_SITE_OTHER): Payer: No Typology Code available for payment source | Admitting: Rehabilitative and Restorative Service Providers"

## 2021-03-29 ENCOUNTER — Encounter: Payer: Self-pay | Admitting: Rehabilitative and Restorative Service Providers"

## 2021-03-29 ENCOUNTER — Other Ambulatory Visit: Payer: Self-pay

## 2021-03-29 DIAGNOSIS — M25521 Pain in right elbow: Secondary | ICD-10-CM | POA: Diagnosis not present

## 2021-03-29 DIAGNOSIS — R600 Localized edema: Secondary | ICD-10-CM | POA: Diagnosis not present

## 2021-03-29 DIAGNOSIS — M6281 Muscle weakness (generalized): Secondary | ICD-10-CM

## 2021-03-29 DIAGNOSIS — M25631 Stiffness of right wrist, not elsewhere classified: Secondary | ICD-10-CM | POA: Diagnosis not present

## 2021-03-29 NOTE — Therapy (Signed)
[facility] Physical Therapy [address] [place], [place], [address] Phone: [phone]   Fax:  [phone]  Physical Therapy Treatment  Patient Details  Name: Brenda Daniel MRN: [id number] Date of Birth: 1956/01/20 Referring Provider (PT): Brenda Batman MD   Encounter Date: 03/29/2021   PT End of Session - 03/29/21 0841     Visit Number 3    Number of Visits 8    Date for PT Re-Evaluation 05/05/21    PT Start Time 0843    PT Stop Time 0922    PT Time Calculation (min) 39 min    Activity Tolerance Patient tolerated treatment well    Behavior During Therapy [facility] for tasks assessed/performed             Past Medical History:  Diagnosis Date   Allergic rhinitis    per Dr. Lucie Leather   Asthma    per Dr. Lucie Leather   DJD (degenerative joint disease)    GERD (gastroesophageal reflux disease)    Hyperlipidemia    Hypertension     Past Surgical History:  Procedure Laterality Date   APPENDECTOMY     BRAVO PH STUDY N/A 03/16/2015   Procedure: BRAVO PH STUDY;  Surgeon: Charlott Rakes, MD;  Location: [facility];  Service: Endoscopy;  Laterality: N/A;   CESAREAN SECTION     ESOPHAGOGASTRODUODENOSCOPY N/A 03/16/2015   Procedure: ESOPHAGOGASTRODUODENOSCOPY (EGD);  Surgeon: Charlott Rakes, MD;  Location: [facility];  Service: Endoscopy;  Laterality: N/A;   IR ANGIO INTRA EXTRACRAN SEL COM CAROTID INNOMINATE BILAT MOD SED  04/24/2019   IR ANGIO VERTEBRAL SEL VERTEBRAL BILAT MOD SED  04/24/2019   IR US GUIDE VASC ACCESS RIGHT  04/24/2019   MYOMECTOMY     TEMPOROMANDIBULAR JOINT SURGERY Right    TONSILLECTOMY      There were no vitals filed for this visit.   Subjective Assessment - 03/29/21 0842     Subjective Pt. indicated feeling similar overall complaints, not hurting all the time.  Pt. indicated no pain upon arrival.  Pt. indicated complaints in upper arm still noted during wrist strengthening.    Pertinent History Bilateral carpal tunnel  release    Limitations Lifting;House hold activities    Patient Stated Goals Return to workouts and normal activity without medial elbow pain.    Currently in Pain? No/denies    Pain Score 0-No pain                               [facility] Adult PT Treatment/Exercise - 03/29/21 0001       Wrist Exercises   Other wrist exercises eccentric wrist flexor strengthening  c blue band for adpatability at home 3 x 10, wrist flexor stretching 30 se x 3    Other wrist exercises supination/pronation 2 lb weight 20x each, shoulder ER c towel at side 2 x 10 bilateral blue band      Manual Therapy   Manual therapy comments compression to Lt wrist flexors c movmeent                     PT Education - 03/29/21 0924     Education Details HEP    Person(s) Educated Patient    Methods Explanation;Demonstration;Verbal cues;Handout    Comprehension Returned demonstration;Verbalized understanding              PT Short Term Goals - 03/29/21 1031  PT SHORT TERM GOAL #1   Title [name] will have Rt wrist AROM = to that of the Lt.    Time 4    Period Weeks    Status On-going    Target Date 04/21/21               PT Long Term Goals - 03/24/21 1716       PT LONG TERM GOAL #1   Title Improve FOTO to 67.    Baseline 51    Time 6    Period Weeks    Status New    Target Date 05/05/21      PT LONG TERM GOAL #2   Title Improve R grip strength to 70+ pounds.    Baseline < 60 pounds    Time 6    Period Weeks    Status New    Target Date 05/05/21      PT LONG TERM GOAL #3   Title [name] will report R medial elbow pain as consistently 0-2/10 on the Numeric Pain Rating Scale.    Baseline Affects workouts so 4+/10    Time 6    Period Weeks    Status New    Target Date 05/05/21      PT LONG TERM GOAL #4   Title [name] will be independent and compliant with her long-term HEP.    Baseline Assigned to day    Time 6    Period Weeks    Status  New    Target Date 05/05/21                   Plan - 03/29/21 0102     Clinical Impression Statement Positioning challenges resulting in Rt upper arm symptoms noted c pressure in WB.  Possible connection to Rt posterior rotator cuff weakness on Rt vs. Lt as reported during ER strengthening.  Compression to Wrist flexor trigger points produced some localized trigger points response (possible dry needling in future per Pt. request).    Personal Factors and Comorbidities Comorbidity 1;Time since onset of injury/illness/exacerbation    Comorbidities Previous carpal tunnel releases    PT Duration 6 weeks    PT Treatment/Interventions ADLs/Self Care Home Management;Electrical Stimulation;Cryotherapy;Iontophoresis 4mg /ml Dexamethasone;Ultrasound;Therapeutic activities;Neuromuscular re-education;Therapeutic exercise;Patient/family education;Manual techniques;Dry needling;Vasopneumatic Device    PT Next Visit Plan General strengthening progression, possible dry needling to wrist flexors.    PT Home Exercise Plan Access Code: [email or web address]    Consulted and Agree with Plan of Care Patient             Patient will benefit from skilled therapeutic intervention in order to improve the following deficits and impairments:  Decreased endurance, Decreased range of motion, Decreased strength, Increased edema, Impaired UE functional use, Pain  Visit Diagnosis: Muscle weakness (generalized)  Stiffness of right wrist, not elsewhere classified  Pain in right elbow  Localized edema     Problem List Patient Active Problem List   Diagnosis Date Noted   Epicondylitis elbow, medial, right 02/17/2020   Bilateral bunions 02/17/2020   Medial epicondylitis of elbow, left 03/03/2019   Arch pain, unspecified laterality 03/03/2019   Pain in right foot 03/03/2019   Cough 05/23/2018   Moderate persistent asthma with acute exacerbation 05/23/2018   Perennial allergic rhinitis 05/23/2018   Carpal tunnel  syndrome, left upper limb 02/18/2017   Pain in left hip 04/23/2016   Pain in both upper extremities 04/23/2016   Mild intermittent asthma with acute exacerbation 06/01/2015   LPRD (  laryngopharyngeal reflux disease) 06/01/2015   Allergic rhinoconjunctivitis 06/01/2015   Morbid obesity (HCC) 04/02/2015   GERD (gastroesophageal reflux disease) 03/16/2015   Upper airway cough syndrome 03/16/2015    Chyrel Masson, PT, DPT, OCS, ATC 03/29/21  9:25 AM    [facility] Physical Therapy [address] [place], [place], [address] Phone: [phone]   Fax:  [phone]  Name: Brenda Daniel MRN: [id number] Date of Birth: August 04, 1955

## 2021-03-29 NOTE — Patient Instructions (Signed)
Access Code: [email or web address] URL: [email or web address] Date: 03/29/2021 Prepared by: Chyrel Masson  Exercises Wrist Flexion AROM - 2 x daily - 7 x weekly - 2 sets - 20 reps Seated Wrist Flexion Stretch - 2 x daily - 7 x weekly - 1 sets - 10 reps - 10 seconds hold Seated Wrist Extension Stretch - 2 x daily - 7 x weekly - 1 sets - 10 reps - 10 seconds hold Bent Over Single Arm Shoulder Row with Dumbbell - 1 x daily - 3 x weekly - 1-3 sets - 10 reps Standing Shoulder Extension - 1 x daily - 3 x weekly - 1-3 sets - 10 reps Shoulder External Rotation with Anchored Resistance - 2 x daily - 7 x weekly - 3 sets - 10 reps

## 2021-04-07 ENCOUNTER — Encounter: Payer: Self-pay | Admitting: Rehabilitative and Restorative Service Providers"

## 2021-04-07 ENCOUNTER — Ambulatory Visit (INDEPENDENT_AMBULATORY_CARE_PROVIDER_SITE_OTHER): Payer: No Typology Code available for payment source | Admitting: Rehabilitative and Restorative Service Providers"

## 2021-04-07 ENCOUNTER — Other Ambulatory Visit: Payer: Self-pay

## 2021-04-07 DIAGNOSIS — M25631 Stiffness of right wrist, not elsewhere classified: Secondary | ICD-10-CM | POA: Diagnosis not present

## 2021-04-07 DIAGNOSIS — R600 Localized edema: Secondary | ICD-10-CM | POA: Diagnosis not present

## 2021-04-07 DIAGNOSIS — M25521 Pain in right elbow: Secondary | ICD-10-CM

## 2021-04-07 DIAGNOSIS — M6281 Muscle weakness (generalized): Secondary | ICD-10-CM | POA: Diagnosis not present

## 2021-04-07 NOTE — Patient Instructions (Signed)
Access Code: [email or web address] URL: [email or web address] Date: 04/07/2021 Prepared by: Pauletta Browns  Exercises Wrist Flexion AROM - 2 x daily - 7 x weekly - 2 sets - 20 reps Seated Wrist Flexion Stretch - 2 x daily - 7 x weekly - 1 sets - 10 reps - 10 seconds hold Seated Wrist Extension Stretch - 2 x daily - 7 x weekly - 1 sets - 10 reps - 10 seconds hold Bent Over Single Arm Shoulder Row with Dumbbell - 1 x daily - 3 x weekly - 1-3 sets - 10 reps Standing Shoulder Extension - 1 x daily - 3 x weekly - 1-3 sets - 10 reps Shoulder External Rotation with Anchored Resistance - 2 x daily - 7 x weekly - 3 sets - 10 reps Supine Shoulder Internal Rotation Stretch - 2 x daily - 7 x weekly - 1 sets - 10-20 reps - 10 seconds hold Sidelying Shoulder External Rotation Dumbbell - 1 x daily - 7 x weekly - 2-3 sets - 10 reps

## 2021-04-07 NOTE — Therapy (Signed)
[facility] [address] [place], [place], [address] Phone: [phone]   Fax:  [phone]  Physical Therapy Treatment  Patient Details  Name: Brenda Daniel MRN: [id number] Date of Birth: February 25, 1956 Referring Provider (PT): Valeria Batman MD   Encounter Date: 04/07/2021   PT End of Session - 04/07/21 1229     Visit Number 4    Number of Visits 8    Date for PT Re-Evaluation 05/05/21    PT Start Time 0802    PT Stop Time 0846    PT Time Calculation (min) 44 min    Activity Tolerance Patient tolerated treatment well    Behavior During Therapy [facility] for tasks assessed/performed             Past Medical History:  Diagnosis Date   Allergic rhinitis    per Dr. Lucie Leather   Asthma    per Dr. Lucie Leather   DJD (degenerative joint disease)    GERD (gastroesophageal reflux disease)    Hyperlipidemia    Hypertension     Past Surgical History:  Procedure Laterality Date   APPENDECTOMY     BRAVO PH STUDY N/A 03/16/2015   Procedure: BRAVO PH STUDY;  Surgeon: Charlott Rakes, MD;  Location: [facility];  Service: Endoscopy;  Laterality: N/A;   CESAREAN SECTION     ESOPHAGOGASTRODUODENOSCOPY N/A 03/16/2015   Procedure: ESOPHAGOGASTRODUODENOSCOPY (EGD);  Surgeon: Charlott Rakes, MD;  Location: [facility];  Service: Endoscopy;  Laterality: N/A;   IR ANGIO INTRA EXTRACRAN SEL COM CAROTID INNOMINATE BILAT MOD SED  04/24/2019   IR ANGIO VERTEBRAL SEL VERTEBRAL BILAT MOD SED  04/24/2019   IR US GUIDE VASC ACCESS RIGHT  04/24/2019   MYOMECTOMY     TEMPOROMANDIBULAR JOINT SURGERY Right    TONSILLECTOMY      There were no vitals filed for this visit.   Subjective Assessment - 04/07/21 1225     Subjective Brenda Daniel notes her shoulder bothers her more than her elbow with all ADLs.  Shoulder (lateral arm) pain is affecting participation with elbow PT and she asked that we adress this today.    Pertinent History Bilateral carpal tunnel release     Limitations Lifting;House hold activities    Patient Stated Goals Return to workouts and normal activity without medial elbow pain.    Currently in Pain? Yes    Pain Score 4     Pain Location Shoulder    Pain Orientation Right    Pain Descriptors / Indicators Aching;Constant;Sore    Pain Type Acute pain    Pain Radiating Towards NA    Pain Onset 1 to 4 weeks ago    Pain Frequency Constant    Aggravating Factors  All UE ADLs and at night    Effect of Pain on Daily Activities Affects sleep and all R UE function                [facility] PT Assessment - 04/07/21 0001       ROM / Strength   AROM / PROM / Strength AROM;Strength      AROM   Overall AROM  Deficits    AROM Assessment Site Elbow;Shoulder    Right/Left Shoulder Left;Right    Right Shoulder Flexion 170 Degrees    Right Shoulder Internal Rotation 40 Degrees    Right Shoulder External Rotation 115 Degrees    Right Shoulder Horizontal  ADduction 40 Degrees    Left Shoulder Flexion 170 Degrees    Left Shoulder  Internal Rotation 75 Degrees    Left Shoulder External Rotation 90 Degrees    Left Shoulder Horizontal ADduction 45 Degrees    Right Wrist Extension 55 Degrees    Right Wrist Flexion 65 Degrees    Left Wrist Extension 60 Degrees    Left Wrist Flexion 70 Degrees      Strength   Overall Strength Deficits    Overall Strength Comments Grip (L/R in pounds): 57.7/54.8 pounds    Strength Assessment Site Shoulder    Right/Left Shoulder Left;Right    Right Shoulder Internal Rotation --   16.1 pounds   Right Shoulder External Rotation --   10.3 pounds   Left Shoulder Internal Rotation --   21.3 pounds   Left Shoulder External Rotation --   14.8 pounds                          [facility] Adult PT Treatment/Exercise - 04/07/21 0001       Therapeutic Activites    Therapeutic Activities Other Therapeutic Activities    Other Therapeutic Activities Assessment of shoulder as Brenda Daniel had complaints of  shoulder pain with all activities (affects elbow rehabilitation)      Exercises   Exercises Wrist;Hand;Shoulder      Shoulder Exercises: Sidelying   External Rotation Strengthening;Right;10 reps;Limitations    External Rotation Limitations 3 seconds      Shoulder Exercises: Standing   Other Standing Exercises Shoulder blade pinches 10X 5 seconds      Shoulder Exercises: Stretch   Other Shoulder Stretches Supine shoulder IR 20X 10 seconds      Wrist Exercises   Wrist Flexion AAROM;Right;10 reps;Seated;Limitations    Wrist Flexion Limitations 10 seconds (stretch extensors with elbow straight)    Wrist Extension Limitations 10 seconds (include thumb with elbow straight) stretch into extension    Other wrist exercises Drop eccentrics with 3# for wrist flexors (palm up, slowly eccentric lower into extension, L side brings up to flexion) 2 sets of 20                     PT Education - 04/07/21 1228     Education Details Reviewed shoulder exam findings and added activities to address impingement.  Reviewed medial epicondylitis program and reviewed overuse avoidance instructions.    Person(s) Educated Patient    Methods Explanation;Demonstration;Tactile cues;Verbal cues;Handout    Comprehension Verbalized understanding;Tactile cues required;Need further instruction;Returned demonstration;Verbal cues required              PT Short Term Goals - 03/29/21 0842       PT SHORT TERM GOAL #1   Title Brenda Daniel will have Rt wrist AROM = to that of the Lt.    Time 4    Period Weeks    Status On-going    Target Date 04/21/21               PT Long Term Goals - 03/24/21 1716       PT LONG TERM GOAL #1   Title Improve FOTO to 67.    Baseline 51    Time 6    Period Weeks    Status New    Target Date 05/05/21      PT LONG TERM GOAL #2   Title Improve R grip strength to 70+ pounds.    Baseline < 60 pounds    Time 6    Period Weeks    Status New  Target Date  05/05/21      PT LONG TERM GOAL #3   Title Brenda Daniel will report R medial elbow pain as consistently 0-2/10 on the Numeric Pain Rating Scale.    Baseline Affects workouts so 4+/10    Time 6    Period Weeks    Status New    Target Date 05/05/21      PT LONG TERM GOAL #4   Title Brenda Daniel will be independent and compliant with her long-term HEP.    Baseline Assigned to day    Time 6    Period Weeks    Status New    Target Date 05/05/21                   Plan - 04/07/21 1229     Clinical Impression Statement Brenda Daniel notes her R shoulder has been most functionally limiting over the last 1-2 weeks.  At her request, I screened her shoulder.  A tight posterior capsule and weak RTC (particularly ER) was noted.  I added activities to address shoulder impingement along with a review of her medial epicondylitis exercises and education.  We will follow-up next week with progression of both her elbow and shoulder program.    Personal Factors and Comorbidities Comorbidity 1;Time since onset of injury/illness/exacerbation    Comorbidities Previous carpal tunnel releases    Examination-Participation Restrictions Community Activity;Occupation    Stability/Clinical Decision Making Stable/Uncomplicated    Rehab Potential Good    PT Duration 6 weeks    PT Treatment/Interventions ADLs/Self Care Home Management;Electrical Stimulation;Cryotherapy;Iontophoresis 4mg /ml Dexamethasone;Ultrasound;Therapeutic activities;Neuromuscular re-education;Therapeutic exercise;Patient/family education;Manual techniques;Dry needling;Vasopneumatic Device    PT Next Visit Plan Wrist flexor strength progression, posterior capsule stretching, RTC strengthening with wrist in neutral    PT Home Exercise Plan Access Code: [email or web address]    Consulted and Agree with Plan of Care Patient             Patient will benefit from skilled therapeutic intervention in order to improve the following deficits and impairments:   Decreased endurance, Decreased range of motion, Decreased strength, Increased edema, Impaired UE functional use, Pain  Visit Diagnosis: Muscle weakness (generalized)  Stiffness of right wrist, not elsewhere classified  Pain in right elbow  Localized edema     Problem List Patient Active Problem List   Diagnosis Date Noted   Epicondylitis elbow, medial, right 02/17/2020   Bilateral bunions 02/17/2020   Medial epicondylitis of elbow, left 03/03/2019   Arch pain, unspecified laterality 03/03/2019   Pain in right foot 03/03/2019   Cough 05/23/2018   Moderate persistent asthma with acute exacerbation 05/23/2018   Perennial allergic rhinitis 05/23/2018   Carpal tunnel syndrome, left upper limb 02/18/2017   Pain in left hip 04/23/2016   Pain in both upper extremities 04/23/2016   Mild intermittent asthma with acute exacerbation 06/01/2015   LPRD (laryngopharyngeal reflux disease) 06/01/2015   Allergic rhinoconjunctivitis 06/01/2015   Morbid obesity (HCC) 04/02/2015   GERD (gastroesophageal reflux disease) 03/16/2015   Upper airway cough syndrome 03/16/2015    03/18/2015, PT, MPT 04/07/2021, 12:32 PM  [facility] [address] [place], [place], [place] Phone: [phone]   Fax:  [phone]  Name: Brenda Daniel MRN: Magda Bernheim Date of Birth: November 28, 1955

## 2021-04-10 ENCOUNTER — Encounter: Payer: Self-pay | Admitting: Physical Therapy

## 2021-04-10 ENCOUNTER — Other Ambulatory Visit: Payer: Self-pay

## 2021-04-10 ENCOUNTER — Ambulatory Visit (INDEPENDENT_AMBULATORY_CARE_PROVIDER_SITE_OTHER): Payer: No Typology Code available for payment source | Admitting: Physical Therapy

## 2021-04-10 DIAGNOSIS — M25631 Stiffness of right wrist, not elsewhere classified: Secondary | ICD-10-CM | POA: Diagnosis not present

## 2021-04-10 DIAGNOSIS — M25521 Pain in right elbow: Secondary | ICD-10-CM | POA: Diagnosis not present

## 2021-04-10 DIAGNOSIS — R600 Localized edema: Secondary | ICD-10-CM

## 2021-04-10 DIAGNOSIS — M6281 Muscle weakness (generalized): Secondary | ICD-10-CM

## 2021-04-10 NOTE — Therapy (Signed)
[facility] Therapy [address] [place], [place], [address] Phone: [phone]   Fax:  [phone]  Physical Therapy Treatment  Patient Details  Name: Brenda Daniel MRN: [id number] Date of Birth: 1955/11/28 Referring Provider (PT): Brenda Batman MD   Encounter Date: 04/10/2021   PT End of Session - 04/10/21 0800     Visit Number 5    Number of Visits 8    Date for PT Re-Evaluation 05/05/21    PT Start Time 0800    PT Stop Time 0845    PT Time Calculation (min) 45 min    Activity Tolerance Patient tolerated treatment well    Behavior During Therapy [facility] for tasks assessed/performed             Past Medical History:  Diagnosis Date   Allergic rhinitis    per Dr. Lucie Daniel   Asthma    per Dr. Lucie Daniel   DJD (degenerative joint disease)    GERD (gastroesophageal reflux disease)    Hyperlipidemia    Hypertension     Past Surgical History:  Procedure Laterality Date   APPENDECTOMY     BRAVO PH STUDY N/A 03/16/2015   Procedure: BRAVO PH STUDY;  Surgeon: Brenda Rakes, MD;  Location: [facility];  Service: Endoscopy;  Laterality: N/A;   CESAREAN SECTION     ESOPHAGOGASTRODUODENOSCOPY N/A 03/16/2015   Procedure: ESOPHAGOGASTRODUODENOSCOPY (EGD);  Surgeon: Brenda Rakes, MD;  Location: [facility];  Service: Endoscopy;  Laterality: N/A;   IR ANGIO INTRA EXTRACRAN SEL COM CAROTID INNOMINATE BILAT MOD SED  04/24/2019   IR ANGIO VERTEBRAL SEL VERTEBRAL BILAT MOD SED  04/24/2019   IR US GUIDE VASC ACCESS RIGHT  04/24/2019   MYOMECTOMY     TEMPOROMANDIBULAR JOINT SURGERY Right    TONSILLECTOMY      There were no vitals filed for this visit.   Subjective Assessment - 04/10/21 0800     Subjective Feeling mucles in shoulder blade area. Elbow still hurts.    Pertinent History Bilateral carpal tunnel release    Limitations Lifting;House hold activities    Patient Stated Goals Return to workouts and normal activity without medial  elbow pain.    Currently in Pain? Yes   no pain in shoulder right now   Pain Score 2     Pain Location Elbow    Pain Orientation Right    Pain Descriptors / Indicators Sore    Pain Type Acute pain                               [facility] Adult PT Treatment/Exercise - 04/10/21 0001       Shoulder Exercises: Prone   Horizontal ABduction 1 Right;10 reps    Horizontal ABduction 1 Limitations 3 sec    Horizontal ABduction 2 Right;10 reps    Horizontal ABduction 2 Limitations some pain in to shoulder      Shoulder Exercises: Sidelying   External Rotation Strengthening;Right;10 reps;Limitations    External Rotation Limitations 3 seconds    Other Sidelying Exercises 2#  flexion and horizontal ABD x 10 ea      Shoulder Exercises: Stretch   Other Shoulder Stretches Supine shoulder IR 20X 10 seconds      Manual Therapy   Manual Therapy Soft tissue mobilization    Manual therapy comments skilled palpation and monitoring of soft tissues during DN  Trigger Point Dry Needling - 04/10/21 0001     Consent Given? Yes    Education Handout Provided Previously provided    Muscles Treated Head and Neck Upper trapezius    Muscles Treated Upper Quadrant Subscapularis;Infraspinatus    Muscles Treated Wrist/Hand Flexor carpi ulnaris    Dry Needling Comments right    Upper Trapezius Response Twitch reponse elicited;Palpable increased muscle length    Infraspinatus Response Twitch response elicited;Palpable increased muscle length    Subscapularis Response Twitch response elicited;Palpable increased muscle length    Flexor carpi ulnaris Response Palpable increased muscle length                   PT Education - 04/10/21 1001     Education Details reviewed DN and aftercare; verbally added SDLY flex/horizontal ABD to HEP (pt wrote on her sheet); discussed MFR using MELT method    Person(s) Educated Patient    Methods Explanation;Demonstration     Comprehension Verbalized understanding              PT Short Term Goals - 03/29/21 0842       PT SHORT TERM GOAL #1   Title [name] will have Rt wrist AROM = to that of the Lt.    Time 4    Period Weeks    Status On-going    Target Date 04/21/21               PT Long Term Goals - 03/24/21 1716       PT LONG TERM GOAL #1   Title Improve FOTO to 67.    Baseline 51    Time 6    Period Weeks    Status New    Target Date 05/05/21      PT LONG TERM GOAL #2   Title Improve R grip strength to 70+ pounds.    Baseline < 60 pounds    Time 6    Period Weeks    Status New    Target Date 05/05/21      PT LONG TERM GOAL #3   Title [name] will report R medial elbow pain as consistently 0-2/10 on the Numeric Pain Rating Scale.    Baseline Affects workouts so 4+/10    Time 6    Period Weeks    Status New    Target Date 05/05/21      PT LONG TERM GOAL #4   Title [name] will be independent and compliant with her long-term HEP.    Baseline Assigned to day    Time 6    Period Weeks    Status New    Target Date 05/05/21                   Plan - 04/10/21 1002     Clinical Impression Statement Patient continues to c/o referred right shoulder pain with ADLS. Her elbow is better she states, but her shoulder is still limiting her elbow exercises. She still feels it just superior to medial epicondyle with certain activities. HEP was reviewed and modified as needed. Initial trial of DN to address shoulder spasms went well, especially in subscapularis, infraspinatus and UT.    PT Treatment/Interventions ADLs/Self Care Home Management;Electrical Stimulation;Cryotherapy;Iontophoresis 4mg /ml Dexamethasone;Ultrasound;Therapeutic activities;Neuromuscular re-education;Therapeutic exercise;Patient/family education;Manual techniques;Dry needling;Vasopneumatic Device    PT Next Visit Plan Assess response to DN, Wrist flexor strength progression, posterior capsule  stretching, RTC strengthening with wrist in neutral  Patient will benefit from skilled therapeutic intervention in order to improve the following deficits and impairments:  Decreased endurance, Decreased range of motion, Decreased strength, Increased edema, Impaired UE functional use, Pain  Visit Diagnosis: Muscle weakness (generalized)  Pain in right elbow  Stiffness of right wrist, not elsewhere classified  Localized edema     Problem List Patient Active Problem List   Diagnosis Date Noted   Epicondylitis elbow, medial, right 02/17/2020   Bilateral bunions 02/17/2020   Medial epicondylitis of elbow, left 03/03/2019   Arch pain, unspecified laterality 03/03/2019   Pain in right foot 03/03/2019   Cough 05/23/2018   Moderate persistent asthma with acute exacerbation 05/23/2018   Perennial allergic rhinitis 05/23/2018   Carpal tunnel syndrome, left upper limb 02/18/2017   Pain in left hip 04/23/2016   Pain in both upper extremities 04/23/2016   Mild intermittent asthma with acute exacerbation 06/01/2015   LPRD (laryngopharyngeal reflux disease) 06/01/2015   Allergic rhinoconjunctivitis 06/01/2015   Morbid obesity (HCC) 04/02/2015   GERD (gastroesophageal reflux disease) 03/16/2015   Upper airway cough syndrome 03/16/2015    Solon Palm, PT 04/10/2021, 3:09 PM  [facility] Physical Therapy [address] [place], [place], [address] Phone: [phone]   Fax:  [phone]  Name: Brenda Daniel MRN: [id number] Date of Birth: 1955-08-03

## 2021-04-19 ENCOUNTER — Other Ambulatory Visit: Payer: Self-pay

## 2021-04-19 ENCOUNTER — Ambulatory Visit (INDEPENDENT_AMBULATORY_CARE_PROVIDER_SITE_OTHER): Payer: No Typology Code available for payment source | Admitting: Rehabilitative and Restorative Service Providers"

## 2021-04-19 ENCOUNTER — Encounter: Payer: Self-pay | Admitting: Rehabilitative and Restorative Service Providers"

## 2021-04-19 DIAGNOSIS — M25521 Pain in right elbow: Secondary | ICD-10-CM

## 2021-04-19 DIAGNOSIS — M6281 Muscle weakness (generalized): Secondary | ICD-10-CM

## 2021-04-19 DIAGNOSIS — M25631 Stiffness of right wrist, not elsewhere classified: Secondary | ICD-10-CM

## 2021-04-19 DIAGNOSIS — R600 Localized edema: Secondary | ICD-10-CM | POA: Diagnosis not present

## 2021-04-19 NOTE — Patient Instructions (Signed)
Access Code: [email or web address] URL: [email or web address] Date: 04/19/2021 Prepared by: Pauletta Browns  Exercises Wrist Flexion AROM - 2 x daily - 7 x weekly - 2 sets - 20 reps Seated Wrist Flexion Stretch - 1 x daily - 7 x weekly - 1 sets - 10 reps - 10 seconds hold Seated Wrist Extension Stretch - 1 x daily - 7 x weekly - 1 sets - 10 reps - 10 seconds hold Shoulder External Rotation with Anchored Resistance - 1 x daily - 7 x weekly - 2-3 sets - 10 reps - 3 seconds hold Supine Shoulder Internal Rotation Stretch - 2 x daily - 7 x weekly - 1 sets - 10-20 reps - 10 seconds hold Standing Shoulder Internal Rotation Stretch with Hands Behind Back - 2-3 x daily - 7 x weekly - 1 sets - 5 reps - 10 seconds hold Supine Scapular Protraction in Flexion with Dumbbells - 1-2 x daily - 7 x weekly - 1 sets - 20 reps - 3 seconds hold Standing Scapular Retraction - 5 x daily - 7 x weekly - 1 sets - 5 reps - 5 second hold

## 2021-04-19 NOTE — Therapy (Signed)
[facility] Physical Therapy [address] [place], [place], [address] Phone: [phone]   Fax:  [phone]  Physical Therapy Treatment  Patient Details  Name: Brenda Daniel MRN: [id number] Date of Birth: [birth year] Referring Provider (PT): Valeria Batman MD   Encounter Date: 04/19/2021   PT End of Session - 04/19/21 1718     Visit Number 6    Number of Visits 8    Date for PT Re-Evaluation 05/05/21    PT Start Time 0802    PT Stop Time 0847    PT Time Calculation (min) 45 min    Activity Tolerance Patient tolerated treatment well    Behavior During Therapy [facility] for tasks assessed/performed             Past Medical History:  Diagnosis Date   Allergic rhinitis    per Dr. Lucie Leather   Asthma    per Dr. Lucie Leather   DJD (degenerative joint disease)    GERD (gastroesophageal reflux disease)    Hyperlipidemia    Hypertension     Past Surgical History:  Procedure Laterality Date   APPENDECTOMY     BRAVO PH STUDY N/A 03/16/2015   Procedure: BRAVO PH STUDY;  Surgeon: Charlott Rakes, MD;  Location: [facility];  Service: Endoscopy;  Laterality: N/A;   CESAREAN SECTION     ESOPHAGOGASTRODUODENOSCOPY N/A 03/16/2015   Procedure: ESOPHAGOGASTRODUODENOSCOPY (EGD);  Surgeon: Charlott Rakes, MD;  Location: [facility];  Service: Endoscopy;  Laterality: N/A;   IR ANGIO INTRA EXTRACRAN SEL COM CAROTID INNOMINATE BILAT MOD SED  04/24/2019   IR ANGIO VERTEBRAL SEL VERTEBRAL BILAT MOD SED  04/24/2019   IR US GUIDE VASC ACCESS RIGHT  04/24/2019   MYOMECTOMY     TEMPOROMANDIBULAR JOINT SURGERY Right    TONSILLECTOMY      There were no vitals filed for this visit.   Subjective Assessment - 04/19/21 1715     Subjective Brenda Daniel notes her medial elbow is doing better.  "Tightness" of the lateral arm/shoulder is most limited.    Pertinent History Bilateral carpal tunnel release    Limitations Lifting;House hold activities    Patient Stated Goals Return  to workouts and normal activity without medial elbow pain.    Currently in Pain? Yes    Pain Score 4     Pain Location Arm    Pain Orientation Right;Lateral    Pain Descriptors / Indicators Aching;Tightness    Pain Type Acute pain    Pain Radiating Towards NA    Pain Onset 1 to 4 weeks ago    Pain Frequency Constant    Aggravating Factors  R UE use, better at rest    Pain Relieving Factors Rest and change of position    Effect of Pain on Daily Activities Affects sleep and R UE function    Multiple Pain Sites No                [facility] PT Assessment - 04/19/21 0001       AROM   Right Shoulder Internal Rotation 55 Degrees      Strength   Overall Strength Comments R Grip 59.9 pounds                           [facility] Adult PT Treatment/Exercise - 04/19/21 0001       Exercises   Exercises Wrist;Hand;Shoulder      Shoulder Exercises: Supine   Protraction Strengthening;Both;20 reps;Weights  Protraction Weight (lbs) 3    Internal Rotation AROM;Right;10 reps;Limitations    Internal Rotation Limitations 10 seconds      Shoulder Exercises: Standing   External Rotation Strengthening;Right;10 reps;Theraband    Theraband Level (Shoulder External Rotation) Level 2 (Red)    External Rotation Limitations 2 sets slow eccentrics    Internal Rotation AAROM;Right;10 reps;Limitations    Internal Rotation Limitations Thumb up the back 10 seconds    Other Standing Exercises Shoulder blade pinches 10X 5 seconds    Other Standing Exercises --      Shoulder Exercises: Stretch   Other Shoulder Stretches --      Wrist Exercises   Other wrist exercises Drop eccentrics with 3# for wrist flexors (palm up, slowly eccentric lower into extension, L side brings up to flexion) 2 sets of 20                     PT Education - 04/19/21 1717     Education Details Reviewed HEP for epicondylitis and shoulder impingement as it is affecting elbow PT.  Progressions and  corrections were needed.    Person(s) Educated Patient    Methods Explanation;Demonstration;Tactile cues;Verbal cues;Handout    Comprehension Verbalized understanding;Tactile cues required;Need further instruction;Returned demonstration;Verbal cues required              PT Short Term Goals - 03/29/21 0842       PT SHORT TERM GOAL #1   Title Brenda Daniel will have Rt wrist AROM = to that of the Lt.    Time 4    Period Weeks    Status On-going    Target Date 04/21/21               PT Long Term Goals - 03/24/21 1716       PT LONG TERM GOAL #1   Title Improve FOTO to 67.    Baseline 51    Time 6    Period Weeks    Status New    Target Date 05/05/21      PT LONG TERM GOAL #2   Title Improve R grip strength to 70+ pounds.    Baseline < 60 pounds    Time 6    Period Weeks    Status New    Target Date 05/05/21      PT LONG TERM GOAL #3   Title Brenda Daniel will report R medial elbow pain as consistently 0-2/10 on the Numeric Pain Rating Scale.    Baseline Affects workouts so 4+/10    Time 6    Period Weeks    Status New    Target Date 05/05/21      PT LONG TERM GOAL #4   Title Brenda Daniel will be independent and compliant with her long-term HEP.    Baseline Assigned to day    Time 6    Period Weeks    Status New    Target Date 05/05/21                   Plan - 04/19/21 1719     Clinical Impression Statement Brenda Daniel is making objective progress with her R grip strength and posterior shoulder capsule flexibility.  As posterior capsule and ER shoulder strength improve, R lateral arm "tightness" should improve.  Compliance with elbow activities has slipped due to her impingement symptoms.  Continue to address all R UE impairments to return Brenda Daniel to full function.    Personal Factors and Comorbidities  Comorbidity 1;Time since onset of injury/illness/exacerbation    Comorbidities Previous carpal tunnel releases    Examination-Activity Limitations  Carry;Lift    Examination-Participation Restrictions Community Activity;Occupation    Stability/Clinical Decision Making Stable/Uncomplicated    Rehab Potential Good    PT Frequency --   1-2X/week   PT Duration 6 weeks    PT Treatment/Interventions ADLs/Self Care Home Management;Electrical Stimulation;Cryotherapy;Iontophoresis 4mg /ml Dexamethasone;Ultrasound;Therapeutic activities;Neuromuscular re-education;Therapeutic exercise;Patient/family education;Manual techniques;Dry needling;Vasopneumatic Device    PT Next Visit Plan RA, FOTO    PT Home Exercise Plan Access Code: [email or web address]    Consulted and Agree with Plan of Care Patient             Patient will benefit from skilled therapeutic intervention in order to improve the following deficits and impairments:  Decreased endurance, Decreased range of motion, Decreased strength, Increased edema, Impaired UE functional use, Pain  Visit Diagnosis: Muscle weakness (generalized)  Pain in right elbow  Stiffness of right wrist, not elsewhere classified  Localized edema     Problem List Patient Active Problem List   Diagnosis Date Noted   Epicondylitis elbow, medial, right 02/17/2020   Bilateral bunions 02/17/2020   Medial epicondylitis of elbow, left 03/03/2019   Arch pain, unspecified laterality 03/03/2019   Pain in right foot 03/03/2019   Cough 05/23/2018   Moderate persistent asthma with acute exacerbation 05/23/2018   Perennial allergic rhinitis 05/23/2018   Carpal tunnel syndrome, left upper limb 02/18/2017   Pain in left hip 04/23/2016   Pain in both upper extremities 04/23/2016   Mild intermittent asthma with acute exacerbation 06/01/2015   LPRD (laryngopharyngeal reflux disease) 06/01/2015   Allergic rhinoconjunctivitis 06/01/2015   Morbid obesity (HCC) 04/02/2015   GERD (gastroesophageal reflux disease) 03/16/2015   Upper airway cough syndrome 03/16/2015    03/18/2015, PT, MPT 04/19/2021, 5:23 PM  [facility]  [facility] Physical Therapy [address] [place], [place], [place] Phone: [phone]   Fax:  [phone]  Name: Brenda Daniel MRN: Magda Bernheim Date of Birth: April 22, 1956

## 2021-04-20 HISTORY — PX: RETINAL LASER PROCEDURE: SHX2339

## 2021-04-21 ENCOUNTER — Encounter: Payer: BC Managed Care – PPO | Admitting: Rehabilitative and Restorative Service Providers"

## 2021-05-05 ENCOUNTER — Other Ambulatory Visit: Payer: Self-pay

## 2021-05-05 ENCOUNTER — Ambulatory Visit (INDEPENDENT_AMBULATORY_CARE_PROVIDER_SITE_OTHER): Payer: No Typology Code available for payment source | Admitting: Rehabilitative and Restorative Service Providers"

## 2021-05-05 ENCOUNTER — Encounter: Payer: Self-pay | Admitting: Rehabilitative and Restorative Service Providers"

## 2021-05-05 DIAGNOSIS — M25631 Stiffness of right wrist, not elsewhere classified: Secondary | ICD-10-CM

## 2021-05-05 DIAGNOSIS — M25521 Pain in right elbow: Secondary | ICD-10-CM | POA: Diagnosis not present

## 2021-05-05 DIAGNOSIS — R600 Localized edema: Secondary | ICD-10-CM | POA: Diagnosis not present

## 2021-05-05 DIAGNOSIS — M6281 Muscle weakness (generalized): Secondary | ICD-10-CM

## 2021-05-05 NOTE — Patient Instructions (Signed)
Access Code: [email or web address] URL: [email or web address] Date: 05/05/2021 Prepared by: Pauletta Browns  Exercises Wrist Flexion AROM - 2 x daily - 3 x weekly - 2 sets - 20-30 reps Seated Wrist Flexion Stretch - 1 x daily - 1 x weekly - 1 sets - 10 reps - 10 seconds hold Seated Wrist Extension Stretch - 1 x daily - 1 x weekly - 1 sets - 10 reps - 10 seconds hold Shoulder External Rotation with Anchored Resistance - 1 x daily - 3 x weekly - 2-3 sets - 10 reps - 3 seconds hold Supine Shoulder Internal Rotation Stretch - 1 x daily - 1 x weekly - 1 sets - 10-20 reps - 10 seconds hold Standing Shoulder Internal Rotation Stretch with Hands Behind Back - 1 x daily - 7 x weekly - 1 sets - 5 reps - 10 seconds hold Supine Scapular Protraction in Flexion with Dumbbells - 1 x daily - 3 x weekly - 1 sets - 20-30 reps - 3 seconds hold Standing Scapular Retraction - 5 x daily - 7 x weekly - 1 sets - 5 reps - 5 second hold Shoulder Internal Rotation with Resistance - 1 x daily - 3 x weekly - 1-2 sets - 10 reps - 3 seconds hold

## 2021-05-05 NOTE — Therapy (Signed)
[facility] Physical Therapy [address] [place], [place], [address] Phone: [phone]   Fax:  [phone]  Physical Therapy Treatment  Patient Details  Name: Brenda Daniel MRN: [id number] Date of Birth: Jun 17, 1955 Referring Provider (PT): Valeria Batman MD  Progress Note Reporting Period 03/24/2021 to 05/05/2021  See note below for Objective Data and Assessment of Progress/Goals.     Encounter Date: 05/05/2021   PT End of Session - 05/05/21 0849     Visit Number 7    Number of Visits 8    Date for PT Re-Evaluation 05/05/21    PT Start Time 0800    PT Stop Time 0843    PT Time Calculation (min) 43 min    Activity Tolerance Patient tolerated treatment well;No increased pain    Behavior During Therapy [facility] for tasks assessed/performed             Past Medical History:  Diagnosis Date   Allergic rhinitis    per Dr. Lucie Leather   Asthma    per Dr. Lucie Leather   DJD (degenerative joint disease)    GERD (gastroesophageal reflux disease)    Hyperlipidemia    Hypertension     Past Surgical History:  Procedure Laterality Date   APPENDECTOMY     BRAVO PH STUDY N/A 03/16/2015   Procedure: BRAVO PH STUDY;  Surgeon: Charlott Rakes, MD;  Location: [facility];  Service: Endoscopy;  Laterality: N/A;   CESAREAN SECTION     ESOPHAGOGASTRODUODENOSCOPY N/A 03/16/2015   Procedure: ESOPHAGOGASTRODUODENOSCOPY (EGD);  Surgeon: Charlott Rakes, MD;  Location: [facility];  Service: Endoscopy;  Laterality: N/A;   IR ANGIO INTRA EXTRACRAN SEL COM CAROTID INNOMINATE BILAT MOD SED  04/24/2019   IR ANGIO VERTEBRAL SEL VERTEBRAL BILAT MOD SED  04/24/2019   IR US GUIDE VASC ACCESS RIGHT  04/24/2019   MYOMECTOMY     TEMPOROMANDIBULAR JOINT SURGERY Right    TONSILLECTOMY      There were no vitals filed for this visit.   Subjective Assessment - 05/05/21 0817     Subjective Brenda Daniel notes "significant progress" over the past 2 weeks.  She has been increasing her  challenge to her R shoulder and elbow with Yoga without increased pain.    Pertinent History Bilateral carpal tunnel release    Limitations Lifting;House hold activities    Patient Stated Goals Return to workouts and normal activity without medial elbow pain.    Currently in Pain? No/denies    Pain Score 0-No pain    Pain Location Arm    Pain Orientation Right    Pain Radiating Towards NA    Pain Onset More than a month ago    Pain Frequency Rarely    Aggravating Factors  Overuse    Pain Relieving Factors Exercise    Effect of Pain on Daily Activities No longer affects sleep or R UE function, easing back into full function    Multiple Pain Sites No                [facility] PT Assessment - 05/05/21 0001       ROM / Strength   AROM / PROM / Strength AROM;Strength      AROM   Overall AROM  Within functional limits for tasks performed    AROM Assessment Site Shoulder    Right Shoulder Flexion 170 Degrees    Right Shoulder Internal Rotation 70 Degrees    Right Shoulder External Rotation 115 Degrees    Right Shoulder Horizontal  ADduction 40 Degrees      Strength   Overall Strength Deficits    Overall Strength Comments Grip 56.8 pounds/57.9 pounds    Right Shoulder Internal Rotation --   20.9 pounds (was 16.1)   Right Shoulder External Rotation --   14.7 pounds (was 10.3)                          [facility] Adult PT Treatment/Exercise - 05/05/21 0001       Exercises   Exercises Wrist;Hand;Shoulder      Shoulder Exercises: Supine   Protraction Strengthening;Both;20 reps;Weights    Protraction Weight (lbs) 3    Internal Rotation AROM;Right;10 reps;Limitations    Internal Rotation Limitations 10 seconds      Shoulder Exercises: Standing   External Rotation Strengthening;Right;10 reps;Theraband    Theraband Level (Shoulder External Rotation) Level 3 (Green)    External Rotation Limitations 2 sets slow eccentrics    Internal Rotation AAROM;Right;10  reps;Limitations    Theraband Level (Shoulder Internal Rotation) Level 3 (Green)    Internal Rotation Limitations 2 sets with theraband and Thumb up the back 10 seconds    Other Standing Exercises Shoulder blade pinches 10X 5 seconds      Wrist Exercises   Other wrist exercises Drop eccentrics with 3# for wrist flexors (palm up, slowly eccentric lower into extension, L side brings up to flexion) 2 sets of 20                     PT Education - 05/05/21 0848     Education Details Reviewed RA findings, updated HEP and gave feedback for continued independent rehabilitation.    Person(s) Educated Patient    Methods Explanation;Demonstration;Verbal cues;Handout    Comprehension Verbalized understanding;Returned demonstration;Verbal cues required              PT Short Term Goals - 05/05/21 0848       PT SHORT TERM GOAL #1   Title Brenda Daniel will have Rt wrist AROM = to that of the Lt.    Time 4    Period Weeks    Status Achieved    Target Date 04/21/21               PT Long Term Goals - 05/05/21 0848       PT LONG TERM GOAL #1   Title Improve FOTO to 67.    Baseline 70 (was 51 at evaluation)    Time 6    Period Weeks    Status Achieved    Target Date 05/05/21      PT LONG TERM GOAL #2   Title Improve R grip strength to 70+ pounds.    Baseline Improved but still < 60 pounds    Time 6    Period Weeks    Status On-going    Target Date 05/05/21      PT LONG TERM GOAL #3   Title Brenda Daniel will report R medial elbow pain as consistently 0-2/10 on the Numeric Pain Rating Scale.    Baseline No longer affects workouts 0/10    Time 6    Period Weeks    Status Achieved    Target Date 05/05/21      PT LONG TERM GOAL #4   Title Brenda Daniel will be independent and compliant with her long-term HEP.    Baseline Assigned today    Time 6    Period Weeks  Status Achieved    Target Date 05/05/21                   Plan - 05/05/21 0850     Clinical  Impression Statement AROM and self-reported function are great.  Shoulder and grip strength have improved and will benefit from continued work.  I recommend Brenda Daniel continue her current updated HEP as prescribed while continuing to ease back into her normal Yoga and functional activities.  Follow-up in 3-4 weeks to objectively assess strength progress is scheduled but will be cancelled if she continues to functional normally without pain.  Either way, Brenda Daniel knows to continue her long-term HEP.    Personal Factors and Comorbidities Comorbidity 1;Time since onset of injury/illness/exacerbation    Comorbidities Previous carpal tunnel releases    Examination-Activity Limitations Carry;Lift    Examination-Participation Restrictions Community Activity;Occupation    Stability/Clinical Decision Making Stable/Uncomplicated    Rehab Potential Good    PT Frequency One time visit   1-2X/week   PT Duration 4 weeks    PT Treatment/Interventions ADLs/Self Care Home Management;Electrical Stimulation;Cryotherapy;Iontophoresis 4mg /ml Dexamethasone;Ultrasound;Therapeutic activities;Neuromuscular re-education;Therapeutic exercise;Patient/family education;Manual techniques;Dry needling;Vasopneumatic Device    PT Next Visit Plan Check strength, answer questions with HEP    PT Home Exercise Plan Access Code: [email or web address]    Consulted and Agree with Plan of Care Patient             Patient will benefit from skilled therapeutic intervention in order to improve the following deficits and impairments:  Decreased endurance, Decreased range of motion, Decreased strength, Increased edema, Impaired UE functional use, Pain  Visit Diagnosis: Muscle weakness (generalized)  Pain in right elbow  Stiffness of right wrist, not elsewhere classified  Localized edema     Problem List Patient Active Problem List   Diagnosis Date Noted   Epicondylitis elbow, medial, right 02/17/2020   Bilateral bunions 02/17/2020    Medial epicondylitis of elbow, left 03/03/2019   Arch pain, unspecified laterality 03/03/2019   Pain in right foot 03/03/2019   Cough 05/23/2018   Moderate persistent asthma with acute exacerbation 05/23/2018   Perennial allergic rhinitis 05/23/2018   Carpal tunnel syndrome, left upper limb 02/18/2017   Pain in left hip 04/23/2016   Pain in both upper extremities 04/23/2016   Mild intermittent asthma with acute exacerbation 06/01/2015   LPRD (laryngopharyngeal reflux disease) 06/01/2015   Allergic rhinoconjunctivitis 06/01/2015   Morbid obesity (HCC) 04/02/2015   GERD (gastroesophageal reflux disease) 03/16/2015   Upper airway cough syndrome 03/16/2015    03/18/2015, PT, MPT 05/05/2021, 8:53 AM  [facility] Physical Therapy [address] [place], [place], [place] Phone: [phone]   Fax:  [phone]  Name: Brenda Daniel MRN: Magda Bernheim Date of Birth: 1955-11-06

## 2021-05-18 ENCOUNTER — Encounter: Payer: BC Managed Care – PPO | Admitting: Rehabilitative and Restorative Service Providers"

## 2021-06-02 ENCOUNTER — Other Ambulatory Visit: Payer: Self-pay

## 2021-06-02 ENCOUNTER — Ambulatory Visit (INDEPENDENT_AMBULATORY_CARE_PROVIDER_SITE_OTHER): Payer: No Typology Code available for payment source | Admitting: Rehabilitative and Restorative Service Providers"

## 2021-06-02 ENCOUNTER — Encounter: Payer: Self-pay | Admitting: Rehabilitative and Restorative Service Providers"

## 2021-06-02 DIAGNOSIS — R600 Localized edema: Secondary | ICD-10-CM | POA: Diagnosis not present

## 2021-06-02 DIAGNOSIS — M25521 Pain in right elbow: Secondary | ICD-10-CM | POA: Diagnosis not present

## 2021-06-02 DIAGNOSIS — M25631 Stiffness of right wrist, not elsewhere classified: Secondary | ICD-10-CM | POA: Diagnosis not present

## 2021-06-02 DIAGNOSIS — M6281 Muscle weakness (generalized): Secondary | ICD-10-CM

## 2021-06-02 NOTE — Patient Instructions (Signed)
Access Code: [email or web address] URL: [email or web address] Date: 06/02/2021 Prepared by: Pauletta Browns  Exercises Wrist Flexion AROM - 2 x daily - 3 x weekly - 2 sets - 20-30 reps Seated Wrist Flexion Stretch - 1 x daily - 1 x weekly - 1 sets - 10 reps - 10 seconds hold Seated Wrist Extension Stretch - 1 x daily - 1 x weekly - 1 sets - 10 reps - 10 seconds hold Shoulder External Rotation with Anchored Resistance - 1 x daily - 3 x weekly - 2-3 sets - 10 reps - 3 seconds hold Supine Shoulder Internal Rotation Stretch - 1 x daily - 3 x weekly - 1 sets - 10-20 reps - 10 seconds hold Standing Shoulder Internal Rotation Stretch with Hands Behind Back - 1 x daily - 3 x weekly - 1 sets - 5 reps - 10 seconds hold Supine Scapular Protraction in Flexion with Dumbbells - 1 x daily - 3 x weekly - 1 sets - 20-30 reps - 3 seconds hold Standing Scapular Retraction - 5 x daily - 7 x weekly - 1 sets - 5 reps - 5 second hold Shoulder Internal Rotation with Resistance - 1 x daily - 1 x weekly - 1-2 sets - 10 reps - 3 seconds hold Standing Shoulder Posterior Capsule Stretch - 1 x daily - 1 x weekly - 1 sets - 5 reps - 10 hold

## 2021-06-02 NOTE — Therapy (Signed)
[facility] [address] [place], [place], [address] Phone: [phone]   Fax:  [phone]  Physical Therapy Treatment/DC  Patient Details  Name: Brenda Daniel MRN: [id number] Date of Birth: [birth year] Referring Provider (PT): Garald Balding MD  PHYSICAL THERAPY DISCHARGE SUMMARY  Visits from [facility] of Care: 8  Current functional level related to goals / functional outcomes: See note   Remaining deficits: See note   Education / Equipment: Updated HEP   Patient agrees to discharge. Patient goals were met. Patient is being discharged due to meeting the stated rehab goals.   Encounter Date: 06/02/2021   PT End of Session - 06/02/21 0929     Visit Number 8    Number of Visits 8    Date for PT Re-Evaluation 05/05/21    PT Start Time 0840    PT Stop Time 0921    PT Time Calculation (min) 41 min    Activity Tolerance Patient tolerated treatment well;No increased pain    Behavior During Therapy WFL for tasks assessed/performed             Past Medical History:  Diagnosis Date   Allergic rhinitis    per Dr. Neldon Mc   Asthma    per Dr. Neldon Mc   DJD (degenerative joint disease)    GERD (gastroesophageal reflux disease)    Hyperlipidemia    Hypertension     Past Surgical History:  Procedure Laterality Date   APPENDECTOMY     BRAVO [place] STUDY N/A 03/16/2015   Procedure: BRAVO [place];  Surgeon: Wilford Corner, MD;  Location: [facility];  Service: Endoscopy;  Laterality: N/A;   CESAREAN SECTION     ESOPHAGOGASTRODUODENOSCOPY N/A 03/16/2015   Procedure: ESOPHAGOGASTRODUODENOSCOPY (EGD);  Surgeon: Wilford Corner, MD;  Location: [facility];  Service: Endoscopy;  Laterality: N/A;   IR ANGIO INTRA EXTRACRAN SEL COM CAROTID INNOMINATE BILAT MOD SED  04/24/2019   IR ANGIO VERTEBRAL SEL VERTEBRAL BILAT MOD SED  04/24/2019   IR US GUIDE VASC ACCESS RIGHT  04/24/2019   MYOMECTOMY     TEMPOROMANDIBULAR JOINT SURGERY Right     TONSILLECTOMY      There were no vitals filed for this visit.   Subjective Assessment - 06/02/21 0915     Subjective Brenda Daniel notes no elbow or shoulder pain since last visit.  She did want to get an objective follow-up to know what to focus on long-term.    Pertinent History Bilateral carpal tunnel release    Limitations Lifting;House hold activities    Patient Stated Goals Return to workouts and normal activity without medial elbow pain.    Currently in Pain? No/denies    Pain Score 0-No pain    Pain Location Arm    Pain Orientation Right    Pain Radiating Towards NA    Pain Onset More than a month ago    Pain Frequency Rarely    Aggravating Factors  Was overuse (none now)    Pain Relieving Factors Exercises    Effect of Pain on Daily Activities Has returned to full function    Multiple Pain Sites No                [facility] PT Assessment - 06/02/21 0001       ROM / Strength   AROM / PROM / Strength AROM;Strength      AROM   Overall AROM  Within functional limits for tasks performed    AROM Assessment Site Shoulder  Right Shoulder Flexion 170 Degrees    Right Shoulder Internal Rotation 70 Degrees    Right Shoulder External Rotation 115 Degrees    Right Shoulder Horizontal  ADduction 35 Degrees      Strength   Overall Strength Within functional limits for tasks performed    Overall Strength Comments R 59.7 pounds    Right Shoulder Internal Rotation --   28.8 pounds (was 16.1)   Right Shoulder External Rotation --   12.7 pounds (was 10.3)                          [facility] Adult PT Treatment/Exercise - 06/02/21 0001       Exercises   Exercises Wrist;Hand;Shoulder      Shoulder Exercises: Supine   Protraction Strengthening;Both;20 reps;Weights    Protraction Weight (lbs) 3    Internal Rotation AROM;Right;10 reps;Limitations    Internal Rotation Limitations 10 seconds      Shoulder Exercises: Prone   Other Prone Exercises Plank and down dog  :30 each (to assess shoulder and elbow response)      Shoulder Exercises: Standing   External Rotation Strengthening;Right;10 reps;Theraband    Theraband Level (Shoulder External Rotation) Level 3 (Green)    External Rotation Limitations 2 sets slow eccentrics    Internal Rotation AAROM;Right;10 reps;Limitations    Theraband Level (Shoulder Internal Rotation) Level 3 (Green)    Other Standing Exercises Shoulder blade pinches 10X 5 seconds    Other Standing Exercises Posterior capsule stretch 5X 10 seconds      Wrist Exercises   Other wrist exercises Drop eccentrics with 3# for wrist flexors (palm up, slowly eccentric lower into extension, L side brings up to flexion) 2 sets of 20                     PT Education - 06/02/21 0928     Education Details Reviewed RA findings, updated HEP and assessed yoga activities on shoulder and elbow with tips to self-correct (hand position)    Person(s) Educated Patient    Methods Explanation;Verbal cues;Handout    Comprehension Verbalized understanding;Returned demonstration;Verbal cues required              PT Short Term Goals - 05/05/21 0848       PT SHORT TERM GOAL #1   Title Darielys will have Rt wrist AROM = to that of the Lt.    Time 4    Period Weeks    Status Achieved    Target Date 04/21/21               PT Long Term Goals - 06/02/21 0929       PT LONG TERM GOAL #1   Title Improve FOTO to 67.    Baseline 70 (was 51 at evaluation)    Time 6    Period Weeks    Status Achieved    Target Date 05/05/21      PT LONG TERM GOAL #2   Title Improve R grip strength to 70+ pounds.    Baseline Improved but still < 60 pounds    Time 6    Period Weeks    Status On-going    Target Date 05/05/21      PT LONG TERM GOAL #3   Title Raizy will report R medial elbow pain as consistently 0-2/10 on the Numeric Pain Rating Scale.    Baseline No longer affects workouts 0/10  Time 6    Period Weeks    Status  Achieved    Target Date 05/05/21      PT LONG TERM GOAL #4   Title Sheily will be independent and compliant with her long-term HEP.    Baseline Assigned today    Time 6    Period Weeks    Status Achieved    Target Date 05/05/21                   Plan - 06/02/21 0929     Clinical Impression Statement Janelly has not had significant R elbow or shoulder pain since last visit.  She wanted an objective RA to see how things have been going objectively.  We reviewed findings and reinforced the importance of drop eccentrics for the elbow and theraband ER for the shoulder for long-term success.  She is independent with her updated HEP and appears ready for independent rehabilitation.    Personal Factors and Comorbidities Comorbidity 1;Time since onset of injury/illness/exacerbation    Comorbidities Previous carpal tunnel releases    Examination-Activity Limitations Carry;Lift    Examination-Participation Restrictions Community Activity;Occupation    Stability/Clinical Decision Making Stable/Uncomplicated    Rehab Potential Good    PT Frequency One time visit   1-2X/week   PT Duration Other (comment)    PT Treatment/Interventions ADLs/Self Care Home Management;Electrical Stimulation;Cryotherapy;Iontophoresis 59m/ml Dexamethasone;Ultrasound;Therapeutic activities;Neuromuscular re-education;Therapeutic exercise;Patient/family education;Manual techniques;Dry needling;Vasopneumatic Device    PT Next Visit Plan DC    PT Home Exercise Plan Access Code: [email or web address]    Consulted and Agree with Plan of Care Patient             Patient will benefit from skilled therapeutic intervention in order to improve the following deficits and impairments:  Decreased endurance, Decreased range of motion, Decreased strength, Increased edema, Impaired UE functional use, Pain  Visit Diagnosis: Muscle weakness (generalized)  Pain in right elbow  Stiffness of right wrist, not elsewhere  classified  Localized edema     Problem List Patient Active Problem List   Diagnosis Date Noted   Epicondylitis elbow, medial, right 02/17/2020   Bilateral bunions 02/17/2020   Medial epicondylitis of elbow, left 03/03/2019   Arch pain, unspecified laterality 03/03/2019   Pain in right foot 03/03/2019   Cough 05/23/2018   Moderate persistent asthma with acute exacerbation 05/23/2018   Perennial allergic rhinitis 05/23/2018   Carpal tunnel syndrome, left upper limb 02/18/2017   Pain in left hip 04/23/2016   Pain in both upper extremities 04/23/2016   Mild intermittent asthma with acute exacerbation 06/01/2015   LPRD (laryngopharyngeal reflux disease) 06/01/2015   Allergic rhinoconjunctivitis 06/01/2015   [place]bid [place] 04/02/2015   GERD (gastroesophageal reflux disease) 03/16/2015   Upper airway cough syndrome 03/16/2015    RFarley Ly PT, MPT 06/02/2021, 9:32 AM  [facility] [address] [address]Phone: [phone]  Fax:  [phone] Name: CAVALON COPPINGERMRN: [id number] of Birth: [birth year]

## 2021-07-20 ENCOUNTER — Other Ambulatory Visit: Payer: Self-pay | Admitting: Allergy & Immunology

## 2021-10-02 ENCOUNTER — Ambulatory Visit (INDEPENDENT_AMBULATORY_CARE_PROVIDER_SITE_OTHER): Payer: No Typology Code available for payment source | Admitting: Allergy and Immunology

## 2021-10-02 ENCOUNTER — Encounter: Payer: Self-pay | Admitting: Allergy and Immunology

## 2021-10-02 VITALS — BP 116/76 | HR 80 | Resp 12 | Ht 61.8 in | Wt 174.8 lb

## 2021-10-02 DIAGNOSIS — K219 Gastro-esophageal reflux disease without esophagitis: Secondary | ICD-10-CM | POA: Diagnosis not present

## 2021-10-02 DIAGNOSIS — J4540 Moderate persistent asthma, uncomplicated: Secondary | ICD-10-CM | POA: Diagnosis not present

## 2021-10-02 DIAGNOSIS — J4521 Mild intermittent asthma with (acute) exacerbation: Secondary | ICD-10-CM

## 2021-10-02 DIAGNOSIS — J3089 Other allergic rhinitis: Secondary | ICD-10-CM

## 2021-10-02 IMAGING — CT CT ABD-PELV W/O CM
2 of 4 series · 16 of 46 positions shown, 18 images · non-contrast
Comparison: 06/12/2018 abdominal MRI.

CLINICAL DATA: Status post angiogram with neck and back pain. Rule
out retroperitoneal hematoma. Right hip pain radiating into right
thigh since [REDACTED].

EXAM:
CT ABDOMEN AND PELVIS WITHOUT CONTRAST
TECHNIQUE: Multidetector CT imaging of the abdomen and pelvis was performed
following the standard protocol without IV contrast.

[Series 2: abd/ pelvis · axial · 0.74mm/px · z∈[-483,-73]mm · 13 of 90 slices shown, 15 images]
[im 4/90  soft-tissue]
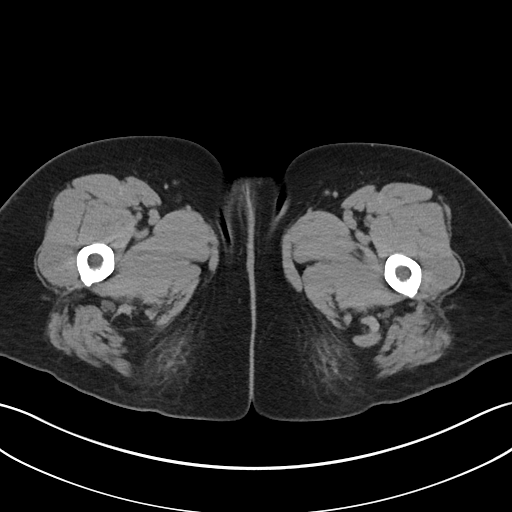
[im 4/90  bone]
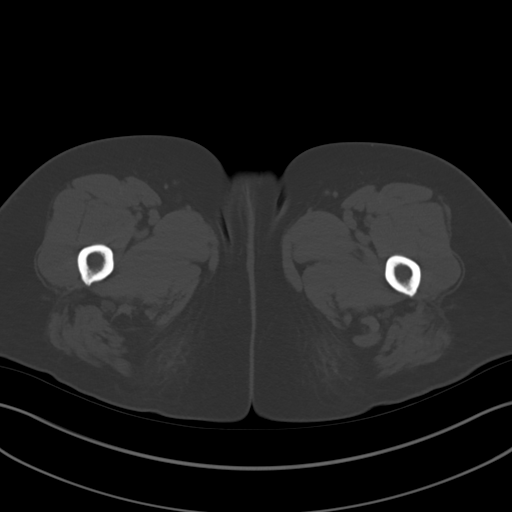
[im 11/90  soft-tissue]
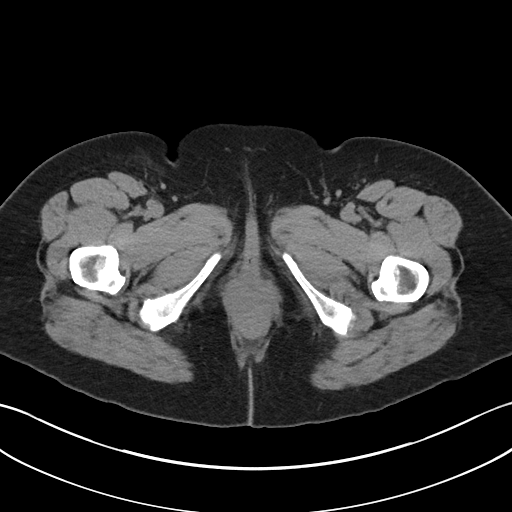
[im 18/90  soft-tissue]
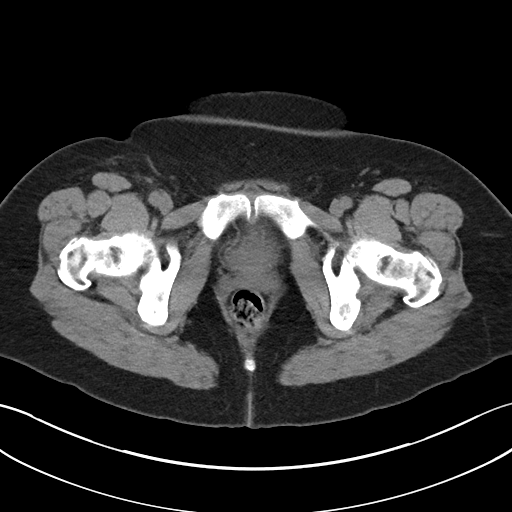
[im 25/90  soft-tissue]
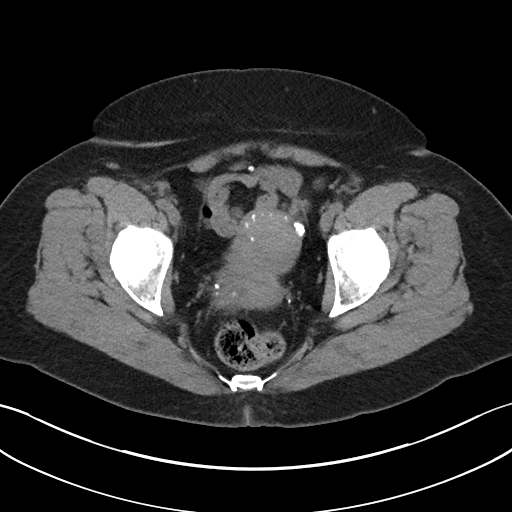
[im 33/90  soft-tissue]
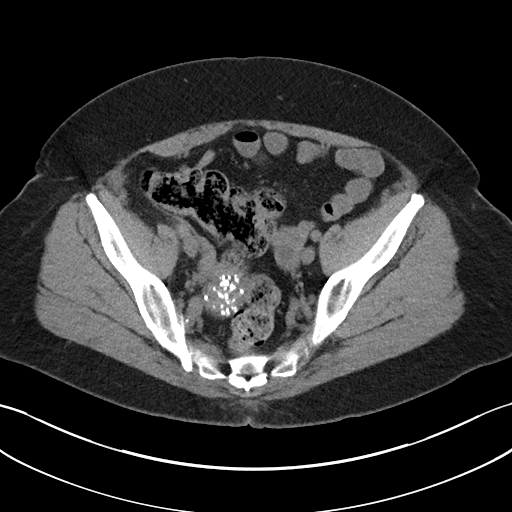
[im 40/90  soft-tissue]
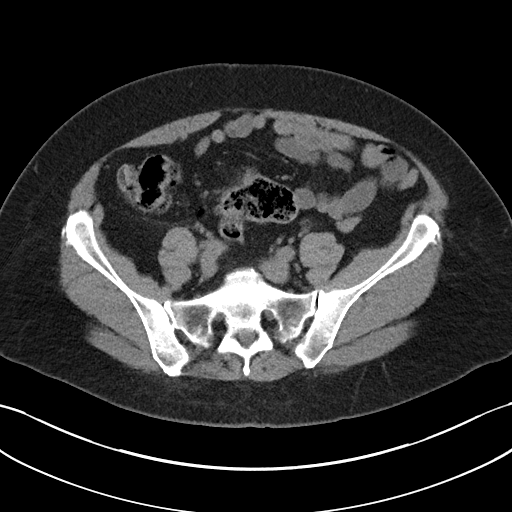
[im 47/90  soft-tissue]
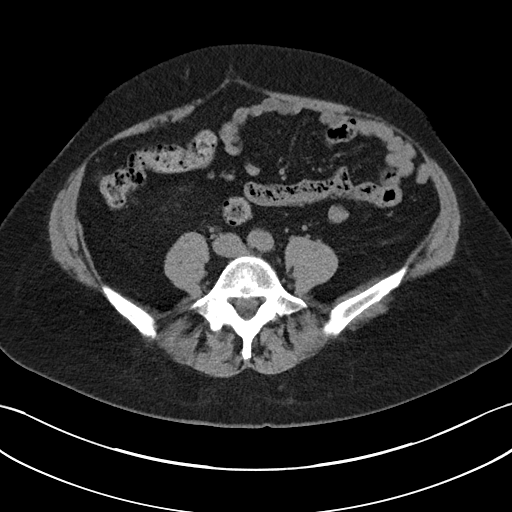
[im 50/90  soft-tissue]
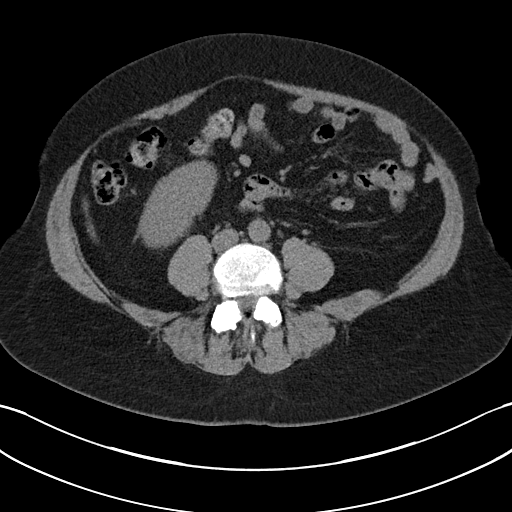
[im 57/90  soft-tissue]
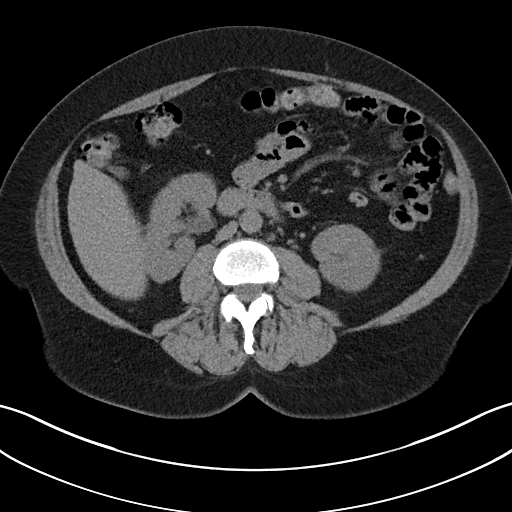
[im 57/90  bone]
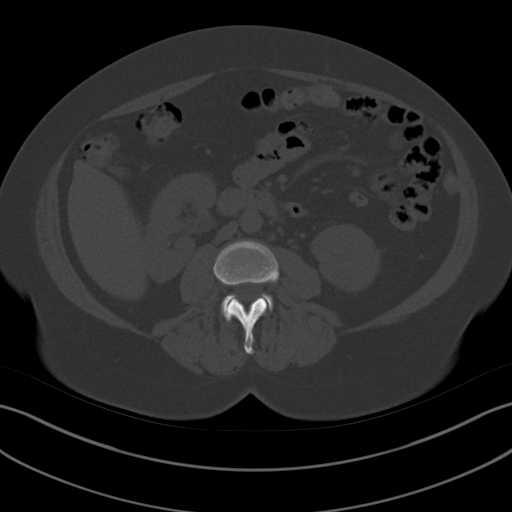
[im 65/90  soft-tissue]
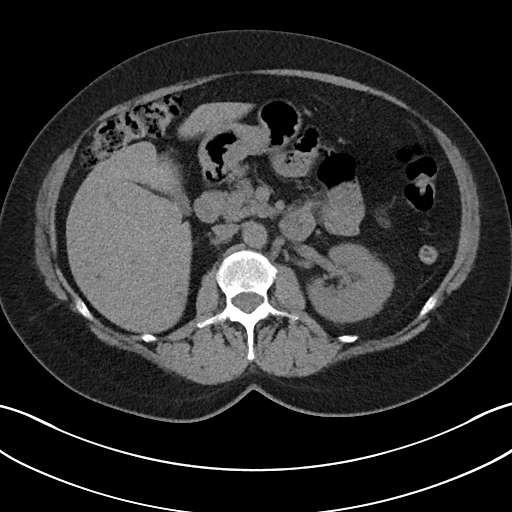
[im 72/90  soft-tissue]
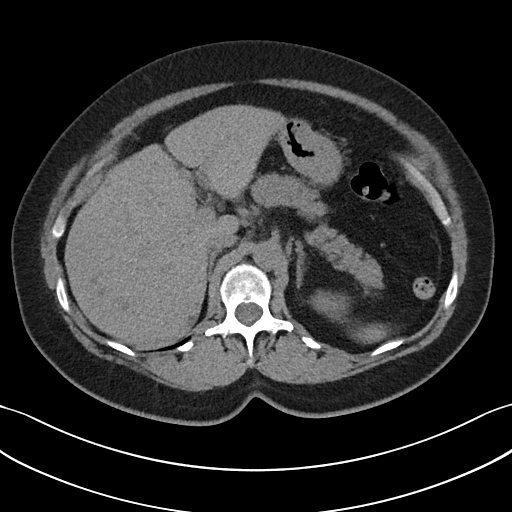
[im 79/90  soft-tissue]
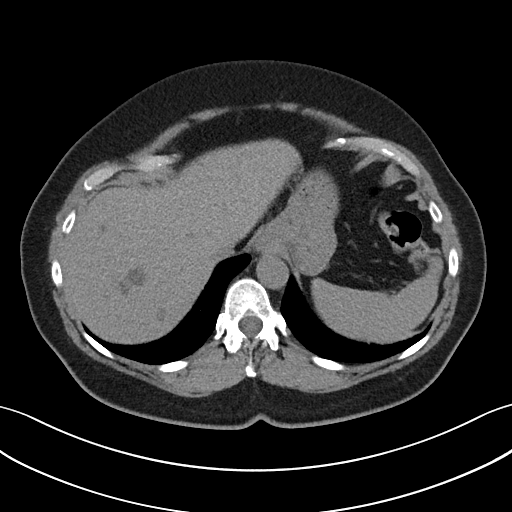
[im 86/90  soft-tissue]
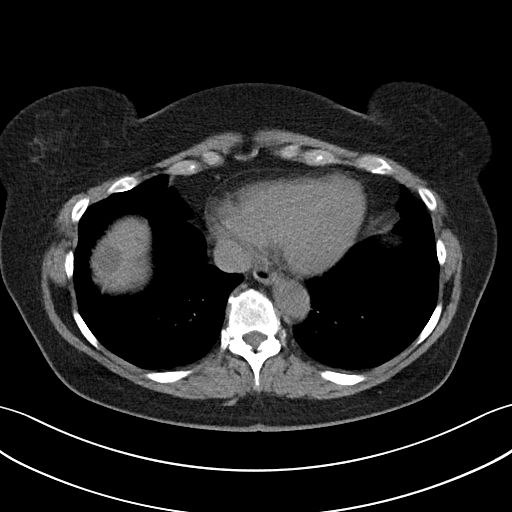

[Series 5: cor st · coronal · 0.69mm/px · 3 of 76 slices shown]
[im 26/76  soft-tissue]
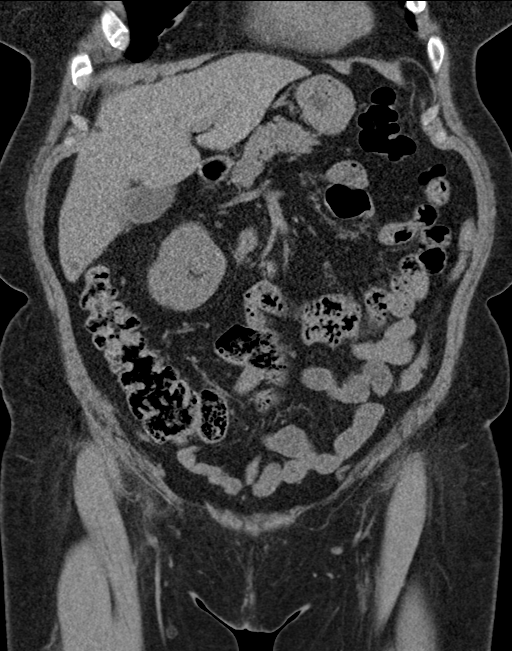
[im 34/76  soft-tissue]
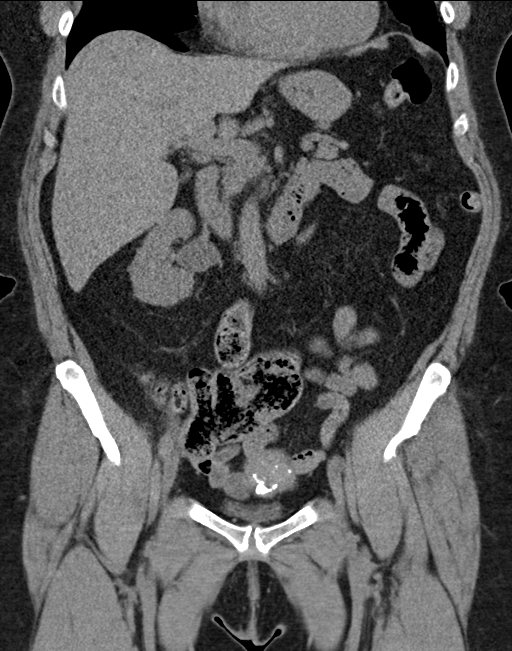
[im 42/76  soft-tissue]
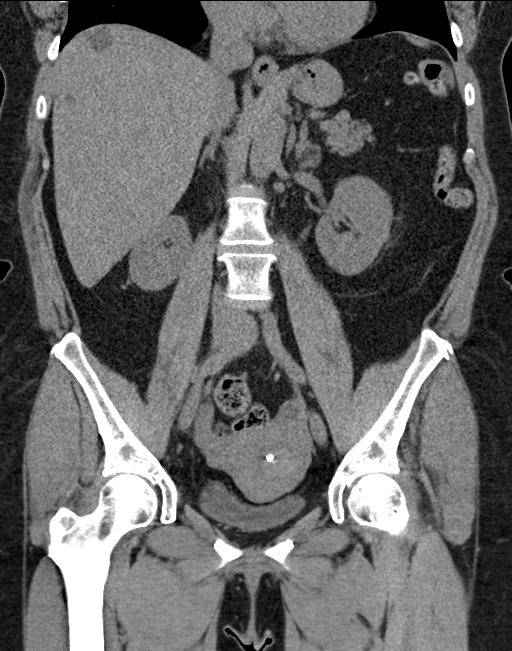

[16 of 46 positions shown; findings below may reference images not displayed]

FINDINGS: Lower chest: Left base scarring. Normal heart size without
pericardial or pleural effusion.

Hepatobiliary: Multiple hepatic cysts and bile duct hamartomas, as
before. Normal gallbladder, without biliary ductal dilatation.

Pancreas: Normal, without mass or ductal dilatation.

Spleen: Normal in size, without focal abnormality.

Adrenals/Urinary Tract: Normal adrenal glands. Malrotated right
kidney. No renal calculi or hydronephrosis. No hydroureter or
ureteric calculi. No bladder calculi.

Stomach/Bowel: Normal stomach, without wall thickening. The cecum
extends into the central pelvis. Normal terminal ileum. Appendix not
visualized, but no pericecal inflammation seen. Normal small bowel.

Vascular/Lymphatic: Normal caliber of the aorta and branch vessels.
No retroperitoneal hemorrhage. No abdominopelvic adenopathy. Minimal
edema adjacent the right femoral vasculature, consistent with the
clinical history. Example 75/2. No hematoma

Reproductive: Fibroid uterus. Example right-sided calcified mass of
5.2 cm. No adnexal mass.

Other: No significant free fluid.

Musculoskeletal: Trace L4-5 anterolisthesis with degenerative disc
disease at this level.
IMPRESSION: 1. No evidence of retroperitoneal hematoma.
2. Expected mild edema surrounding the right femoral vasculature,
consistent with the clinical history.
3.  No acute process in the abdomen or pelvis.
4. Fibroid uterus.

## 2021-10-02 MED ORDER — HYDROCOD POLI-CHLORPHE POLI ER 10-8 MG/5ML PO SUER: ORAL | 0 refills | Status: DC

## 2021-10-02 MED ORDER — ALBUTEROL SULFATE HFA 108 (90 BASE) MCG/ACT IN AERS: INHALATION_SPRAY | RESPIRATORY_TRACT | 1 refills | Status: DC

## 2021-10-02 MED ORDER — METHYLPREDNISOLONE ACETATE 80 MG/ML IJ SUSP
80.0000 mg | Freq: Once | INTRAMUSCULAR | Status: AC
  Administered 2021-10-02: 80 mg via INTRAMUSCULAR

## 2021-10-02 NOTE — Patient Instructions (Addendum)
? ?  1. Continue to treat and prevent inflammation: ? ? A. Flonase 1-2 sprays each nostril one time per day ? B. Flovent 110 - 2 inhalations 1 time per day ? C. Montelukast 10mg  one time per day ? ?2.  Continue to treat and prevent reflux/LPR:   ? ? A. Famotidine 40 mg - 1 tablet 1 time per day ? ?3. If needed: ? ? A. Antihistamine  ? B. Proair HFA - 2 inhalations or Xopenex neb every 4-6 hours ? C. Mucinex DM - 2 tablets two times per day ? ?4. "Action Plan" for flare up: ? ? A. Increase Flovent 110 to 3 inhalations 3 times per day ? B. Add Nexium 40 mg twice a day  ? ?5. For this recent event: ? ? A. Depomedrol 80 IM delivered in clinic today ? B. Tussionex - 2.5-5.0 ml every 12 hours for cough if needed, NARCOTIC, 60 ml ? C. Further evaluation??? ? ?6. Return to clinic in 12 months or earlier if problem ? ?  ? ?  ? ?

## 2021-10-02 NOTE — Progress Notes (Signed)
? ?[place] - [employer] - [place] - [place] - [place] ? ? ?Follow-up Note ? ?Referring Provider: Laurann Montana, MD ?Primary Provider: Laurann Montana, MD ?Date of Office Visit: 10/02/2021 ? ?Subjective:  ? ?Brenda Daniel (DOB: 27-Jul-1955) is a 66 y.o. female who [facility] in re-evaluation of the following: ? ?HPI: Brenda Daniel returns to this clinic in reevaluation of asthma, allergic rhinitis, and LPR.  I last saw Brenda Daniel in this clinic on 14 February 2021. ? ?Overall Brenda Daniel had a wonderful year regarding Brenda Daniel asthma with very good control and Brenda Daniel has slowly tapered off all of Brenda Daniel inhaled steroid.  Likewise, Brenda Daniel did very well with Brenda Daniel nose and tapered off Brenda Daniel nasal steroid.  Brenda Daniel reflux was also under excellent control and Brenda Daniel tapered off Brenda Daniel famotidine. ? ?Brenda Daniel has noticed over the course of the past 2 weeks that Brenda Daniel has had a little bit more problems with some coughing.  Brenda Daniel restarted Brenda Daniel medications and added in Nexium and this past weekend Brenda Daniel developed very significant coughing spells that Brenda Daniel could not contain.  Albuterol did not help these coughs.  Brenda Daniel has also had some nasal congestion and some left ear fullness.  Brenda Daniel has not had any fever or aches or other constitutional symptoms or systemic symptoms.  Brenda Daniel did COVID test herself on 2 occasions which was negative. ? ?Allergies as of 10/02/2021   ? ?   Reactions  ? Advil [ibuprofen] Other (See Comments)  ? Dizzy; any NSAIDS   ? Niacin And Related Other (See Comments)  ? dizzy  ? Red Yeast Rice [cholestin] Other (See Comments)  ? dizzy  ? ?  ? ?  ?Medication List  ? ? ?albuterol (2.5 MG/3ML) 0.083% nebulizer solution ?Commonly known as: PROVENTIL ?Take 3 mLs (2.5 mg total) by nebulization every 6 (six) hours as needed for wheezing or shortness of breath. ?  ?albuterol 108 (90 Base) MCG/ACT inhaler ?Commonly known as: VENTOLIN HFA ?Inhale 2 puffs into the lungs every 4 (four) hours as needed for wheezing or  shortness of breath. ?  ?esomeprazole 40 MG capsule ?Commonly known as: NEXIUM ?Take 1 capsule (40 mg total) by mouth 2 (two) times daily before a meal. ?  ?famotidine 40 MG tablet ?Commonly known as: PEPCID ?CAN TAKE 1 TABLET BY MOUTH EVERY DAY IF NEEDED. ?  ?fluticasone 110 MCG/ACT inhaler ?Commonly known as: Flovent HFA ?Inhale 2 puffs into the lungs 2 (two) times daily as needed (Increase to three inhalations three times a day during flare ups). ?  ?fluticasone 50 MCG/ACT nasal spray ?Commonly known as: FLONASE ?Place 1 spray into both nostrils daily. ?  ?gelatin 650 MG capsule ?Take 650 mg by mouth See admin instructions. Mon - Fri ?  ?losartan 50 MG tablet ?Commonly known as: COZAAR ?Take 50 mg by mouth daily. ?  ?montelukast 10 MG tablet ?Commonly known as: SINGULAIR ?TAKE 1 TABLET BY MOUTH EVERYDAY AT BEDTIME ?  ?nitroGLYCERIN 0.4 MG SL tablet ?Commonly known as: NITROSTAT ?Place 1 tablet (0.4 mg total) under the tongue every 5 (five) minutes as needed for chest pain. ?  ?Potassium 99 MG Tabs ?Take 1 tablet by mouth at bedtime. ?  ?traZODone 50 MG tablet ?Commonly known as: DESYREL ?Take 25 mg by mouth at bedtime. ?  ?triamterene-hydrochlorothiazide 75-50 MG tablet ?Commonly known as: MAXZIDE ?Take 1 tablet by mouth daily. ?  ?valACYclovir 500 MG tablet ?Commonly known as: VALTREX ?Take 500 mg by mouth See admin instructions.  Take one tablet (500 mg) by mouth daily at bedtime, take four tablets (2000 mg) once as one dose as needed at onset of fever blister ?  ?VITAMIN D PO ?Take 1 tablet by mouth See admin instructions. Take one tablet by mouth daily on Monday thru Friday (skip Saturday and Sunday) ?  ? ?Past Medical History:  ?Diagnosis Date  ? Allergic rhinitis   ? per Dr. Lucie Leather  ? Asthma   ? per Dr. Lucie Leather  ? DJD (degenerative joint disease)   ? GERD (gastroesophageal reflux disease)   ? Hyperlipidemia   ? Hypertension   ? ? ?Past Surgical History:  ?Procedure Laterality Date  ? APPENDECTOMY    ? BRAVO  [place] STUDY N/A 03/16/2015  ? Procedure: BRAVO [place] STUDY;  Surgeon: Charlott Rakes, MD;  Location: [facility];  Service: Endoscopy;  Laterality: N/A;  ? CESAREAN SECTION    ? ESOPHAGOGASTRODUODENOSCOPY N/A 03/16/2015  ? Procedure: ESOPHAGOGASTRODUODENOSCOPY (EGD);  Surgeon: Charlott Rakes, MD;  Location: [facility];  Service: Endoscopy;  Laterality: N/A;  ? IR ANGIO INTRA EXTRACRAN SEL COM CAROTID INNOMINATE BILAT MOD SED  04/24/2019  ? IR ANGIO VERTEBRAL SEL VERTEBRAL BILAT MOD SED  04/24/2019  ? IR US GUIDE VASC ACCESS RIGHT  04/24/2019  ? MYOMECTOMY    ? RETINAL LASER PROCEDURE  04/2021  ? TEMPOROMANDIBULAR JOINT SURGERY Right   ? TONSILLECTOMY    ? ? ?Review of systems negative except as noted in HPI / PMHx or noted below: ? ?Review of Systems  ?Constitutional: Negative.   ?HENT: Negative.    ?Eyes: Negative.   ?Respiratory: Negative.    ?Cardiovascular: Negative.   ?Gastrointestinal: Negative.   ?Genitourinary: Negative.   ?Musculoskeletal: Negative.   ?Skin: Negative.   ?Neurological: Negative.   ?Endo/Heme/Allergies: Negative.   ?Psychiatric/Behavioral: Negative.    ? ? ?Objective:  ? ?Vitals:  ? 10/02/21 1547  ?BP: 116/76  ?Pulse: 80  ?Resp: 12  ?SpO2: 96%  ? ?Height: 5' 1.8" (157 cm)  ?Weight: 174 lb 12.8 oz (79.3 kg)  ? ?Physical Exam ?Constitutional:   ?   Appearance: Brenda Daniel is not diaphoretic.  ?HENT:  ?   Head: Normocephalic.  ?   Right Ear: Tympanic membrane, ear canal and external ear normal.  ?   Left Ear: Tympanic membrane, ear canal and external ear normal.  ?   Nose: Nose normal. No mucosal edema or rhinorrhea.  ?   Mouth/Throat:  ?   Pharynx: Uvula midline. No oropharyngeal exudate.  ?Eyes:  ?   Conjunctiva/sclera: Conjunctivae normal.  ?Neck:  ?   Thyroid: No thyromegaly.  ?   Trachea: Trachea normal. No tracheal tenderness or tracheal deviation.  ?Cardiovascular:  ?   Rate and Rhythm: Normal rate and regular rhythm.  ?   Heart sounds: Normal heart sounds, S1 normal and S2 normal. No murmur  heard. ?Pulmonary:  ?   Effort: No respiratory distress.  ?   Breath sounds: Normal breath sounds. No stridor. No wheezing or rales.  ?Lymphadenopathy:  ?   Head:  ?   Right side of head: No tonsillar adenopathy.  ?   Left side of head: No tonsillar adenopathy.  ?   Cervical: No cervical adenopathy.  ?Skin: ?   Findings: No erythema or rash.  ?   Nails: There is no clubbing.  ?Neurological:  ?   Mental Status: Brenda Daniel is alert.  ? ? ?Diagnostics:  ?  ?Spirometry was performed and demonstrated an FEV1 of 1.85 at 84 %  of predicted. ? ?Assessment and Plan:  ? ?1. Not well controlled moderate persistent asthma   ?2. Perennial allergic rhinitis   ?3. LPRD (laryngopharyngeal reflux disease)   ?4. Mild intermittent asthma with acute exacerbation   ? ? ?1. Continue to treat and prevent inflammation: ? ? A. Flonase 1-2 sprays each nostril one time per day ? B. Flovent 110 - 2 inhalations 1 time per day ? C. Montelukast 10mg  one time per day ? ?2.  Continue to treat and prevent reflux/LPR:   ? ? A. Famotidine 40 mg - 1 tablet 1 time per day ? ?3. If needed: ? ? A. Antihistamine  ? B. Proair HFA - 2 inhalations or Xopenex neb every 4-6 hours ? C. Mucinex DM - 2 tablets two times per day ? ?4. "Action Plan" for flare up: ? ? A. Increase Flovent 110 to 3 inhalations 3 times per day ? B. Add Nexium 40 mg twice a day  ? ?5. For this recent event: ? ? A. Depomedrol 80 IM delivered in clinic today ? B. Tussionex - 2.5-5.0 ml every 12 hours for cough if needed, NARCOTIC, 60 ml ? C. Further evaluation??? ? ?6. Return to clinic in 12 months or earlier if problem ? ?Brenda EvansCatherine will hopefully respond to the administration of a systemic steroid and if needed some narcotic-based cough suppressants assuming that Brenda Daniel has a viral flareup of Brenda Daniel respiratory tract disease.  Fortunately, Brenda Daniel airway flow is actually very good.  Brenda Daniel will continue to treat inflammation and reflux induced respiratory disease with the plan noted above.  We are going to  hold off for any further evaluation for other etiologic factors contributing to Brenda Daniel respiratory tract symptoms at this point in time but certainly if Brenda Daniel continues to cough Brenda Daniel is going to require further evaluatio

## 2021-10-03 ENCOUNTER — Encounter: Payer: Self-pay | Admitting: Allergy and Immunology

## 2021-10-11 ENCOUNTER — Telehealth: Payer: Self-pay | Admitting: Allergy and Immunology

## 2021-10-11 DIAGNOSIS — J069 Acute upper respiratory infection, unspecified: Secondary | ICD-10-CM

## 2021-10-11 DIAGNOSIS — J4540 Moderate persistent asthma, uncomplicated: Secondary | ICD-10-CM

## 2021-10-11 NOTE — Telephone Encounter (Signed)
Please have Thaila obtain a chest x-ray and a CBC with differential and comprehensive metabolic panel to make sure were not dealing with something other than what appeared to be a viral respiratory tract infection.

## 2021-10-11 NOTE — Telephone Encounter (Signed)
Called and informed patient of the note per Dr. Neldon Mc. Patient expressed that she would come I the morning to to get labs drawn and obtain her chest Xray.

## 2021-10-11 NOTE — Telephone Encounter (Signed)
Brenda Daniel called in and states she saw Dr. Lucie Leather on Monday for an asthma flare.  Brenda Daniel states that she has been following the recommendations that Dr. Lucie Leather has given her and was told in 3 to 4 days she should feel better and Brenda Daniel states she is not feeling any better.  It has only been 2 days.  Brenda Daniel states she is still coughing and now she is having a productive cough.  Brenda Daniel states she feels a little better until the afternoon and then she "just crashes" Brenda Daniel wants to know what to do and why isn't she feeling any better yet?  Please advise.

## 2021-10-12 ENCOUNTER — Telehealth: Payer: Self-pay | Admitting: Allergy and Immunology

## 2021-10-12 ENCOUNTER — Ambulatory Visit
Admission: RE | Admit: 2021-10-12 | Discharge: 2021-10-12 | Disposition: A | Discharge status: Home / Self Care | Payer: No Typology Code available for payment source | Source: Ambulatory Visit | Attending: Allergy and Immunology | Admitting: Allergy and Immunology

## 2021-10-12 NOTE — Telephone Encounter (Signed)
Brenda Daniel came into the office to get blood work and wanted to let Brenda Daniel know that she developed a UTI yesterday 10/11/2021.  She went to see her provider and was put on 2 medications.  Those medications are Phenazopyridine 200 mg for 2 days and Cephalexin 500 mg for 5 days.  Brenda Daniel states she felt you needed to know this information.

## 2021-10-13 LAB — CBC WITH DIFFERENTIAL
Basophils Absolute: 0 10*3/uL (ref 0.0–0.2)
Basos: 0 %
EOS (ABSOLUTE): 0.1 10*3/uL (ref 0.0–0.4)
Eos: 1 %
Hematocrit: 40.7 % (ref 34.0–46.6)
Hemoglobin: 14.2 g/dL (ref 11.1–15.9)
Immature Grans (Abs): 0 10*3/uL (ref 0.0–0.1)
Immature Granulocytes: 0 %
Lymphocytes Absolute: 1.6 10*3/uL (ref 0.7–3.1)
Lymphs: 16 %
MCH: 31.8 pg (ref 26.6–33.0)
MCHC: 34.9 g/dL (ref 31.5–35.7)
MCV: 91 fL (ref 79–97)
Monocytes Absolute: 0.8 10*3/uL (ref 0.1–0.9)
Monocytes: 8 %
Neutrophils Absolute: 7.5 10*3/uL — ABNORMAL HIGH (ref 1.4–7.0)
Neutrophils: 75 %
RBC: 4.47 x10E6/uL (ref 3.77–5.28)
RDW: 12.7 % (ref 11.7–15.4)
WBC: 10 10*3/uL (ref 3.4–10.8)

## 2021-12-26 ENCOUNTER — Other Ambulatory Visit: Payer: Self-pay | Admitting: Allergy & Immunology

## 2021-12-26 NOTE — Telephone Encounter (Signed)
Per pharmacy patient's insurance does not cover Flovent. They are requesting change to  Qvar ReadiHaler Pulmicort FlexHaler  Please advise, thank you!

## 2022-01-03 ENCOUNTER — Ambulatory Visit: Payer: BC Managed Care – PPO | Admitting: Allergy and Immunology

## 2022-01-29 ENCOUNTER — Other Ambulatory Visit: Payer: Self-pay | Admitting: Allergy & Immunology

## 2022-02-01 ENCOUNTER — Telehealth: Payer: Self-pay

## 2022-02-01 NOTE — Telephone Encounter (Signed)
Patient called stating the Flovent is requiring a prior auth. Patient is almost out of her inhaler.   Please Advise

## 2022-02-02 MED ORDER — FLOVENT HFA 110 MCG/ACT IN AERO
INHALATION_SPRAY | RESPIRATORY_TRACT | 1 refills | Status: DC
Start: 2022-02-02 — End: 2022-02-14

## 2022-02-02 NOTE — Telephone Encounter (Signed)
Per covermyeds - Flovent  HFA 110 MCG/ACT aero does not require a PA.  MEDICATION                                                PRIOR AUTH REQUIREMENT *  Flovent HFA 110MCG/ACT Aerosol                          Not Required Advair HFA                                                              Not Required AirDuo Digihaler                                                  Not Required AirDuo RespiClick                                                  Not Required Alvesco                                                              Not Required ArmonAir Digihaler                                                  Not Required Arnuity Ellipta                                                              Not Required Asmanex [facility]                                                              Not Required Asmanex Twisthaler                                      Not Required Markus Daft Aerosphere  Not Required Budesonide 0.25mg  & 0.5mg                                       Not Required Budesonide-Formoterol Fumarate Dihydrate    Not Required [facility]                                                                          Not Required Flovent Diskus                                                              Not Required Fluticasone Propionate HFA                                      Not Required QVAR Redihaler                                                  Not Required Singulair                                                              Not Required Spiriva Respimat 1.25 Mcg                                      Not Required  Called patient - DOB verified - advised of the above notation.  I will resend prescription to pharmacy.

## 2022-02-08 NOTE — Telephone Encounter (Signed)
Patient came into the office and states she still can't get Flovent bc the pharmacy is still stating they need a Prior Auth.  Patient just wants to make sure that the no prior auth needed was for the [facility] insurance since that is her primary insurance.

## 2022-02-13 ENCOUNTER — Ambulatory Visit (INDEPENDENT_AMBULATORY_CARE_PROVIDER_SITE_OTHER): Payer: No Typology Code available for payment source | Admitting: Allergy and Immunology

## 2022-02-13 VITALS — BP 126/84 | HR 98 | Temp 97.9°F | Resp 16 | Ht 61.8 in | Wt 178.4 lb

## 2022-02-13 DIAGNOSIS — J4540 Moderate persistent asthma, uncomplicated: Secondary | ICD-10-CM

## 2022-02-13 DIAGNOSIS — U099 Post covid-19 condition, unspecified: Secondary | ICD-10-CM | POA: Diagnosis not present

## 2022-02-13 DIAGNOSIS — K219 Gastro-esophageal reflux disease without esophagitis: Secondary | ICD-10-CM | POA: Diagnosis not present

## 2022-02-13 DIAGNOSIS — J3089 Other allergic rhinitis: Secondary | ICD-10-CM | POA: Diagnosis not present

## 2022-02-13 NOTE — Progress Notes (Unsigned)
[place] [facility] [place] [facility] [place]   Follow-up Note  Referring Provider: Harlan Stains, MD Primary Provider: Harlan Stains, MD Date of Office Visit: 02/13/2022  Subjective:   Brenda Daniel (DOB: Aug 18, 1955) is a 66 y.o. female who returns to the [place] [facility] on 02/13/2022 in re-evaluation of the following:  HPI: Brenda Daniel returns to this clinic in reevaluation of asthma, allergic rhinitis, LPR.  Her last visit to this clinic with me was 02 Oct 2021.  During the last visit she appeared to have a viral induced flare of her respiratory tract disease that with aggressive therapy finally resolved and she did well until she contracted COVID about 3 weeks ago manifested as a pretty significant upper and lower respiratory tract infection requiring Paxlovid.  She reinitiated the use of her Flovent as well.  Over the course of the past week since she has started Flovent to 2 and elations twice a day she is done very well yet still continues to have some cough.  Her current plan includes Flonase and Flovent and montelukast and she added in Nexium to her famotidine.  Allergies as of 02/13/2022       Reactions   Advil [ibuprofen] Other (See Comments)   Dizzy; any NSAIDS    Niacin And Related Other (See Comments)   dizzy   Red Yeast Rice [cholestin] Other (See Comments)   dizzy        Medication List    albuterol (2.5 MG/3ML) 0.083% nebulizer solution Commonly known as: PROVENTIL Take 3 mLs (2.5 mg total) by nebulization every 6 (six) hours as needed for wheezing or shortness of breath.   albuterol 108 (90 Base) MCG/ACT inhaler Commonly known as: VENTOLIN HFA Can inhale two puffs every four to six hours as needed for cough or wheeze.   chlorpheniramine-HYDROcodone 10-8 MG/5ML Commonly known as: Tussionex Pennkinetic ER Can take 2.5 to 5.0 mL every 12 hours if needed for cough. NARCOTIC   esomeprazole 40 MG capsule Commonly known as:  NEXIUM Take 1 capsule (40 mg total) by mouth 2 (two) times daily before a meal. What changed:  when to take this reasons to take this   famotidine 40 MG tablet Commonly known as: PEPCID CAN TAKE 1 TABLET BY MOUTH EVERY DAY IF NEEDED.   Flovent HFA 110 MCG/ACT inhaler Generic drug: fluticasone INHALE 2 PUFFS INTO THE LUNGS 1 (ONE) TIME DAILY.   fluticasone 50 MCG/ACT nasal spray Commonly known as: FLONASE Place 1 spray into both nostrils daily.   gelatin 650 MG capsule Take 650 mg by mouth See admin instructions. Mon - Fri   losartan 50 MG tablet Commonly known as: COZAAR Take 50 mg by mouth daily.   montelukast 10 MG tablet Commonly known as: SINGULAIR TAKE 1 TABLET BY MOUTH EVERYDAY AT BEDTIME   nitroGLYCERIN 0.4 MG SL tablet Commonly known as: NITROSTAT Place 1 tablet (0.4 mg total) under the tongue every 5 (five) minutes as needed for chest pain. What changed: additional instructions   Potassium 99 MG Tabs Take 1 tablet by mouth at bedtime.   traZODone 50 MG tablet Commonly known as: DESYREL Take 25 mg by mouth at bedtime.   triamterene-hydrochlorothiazide 75-50 MG tablet Commonly known as: MAXZIDE Take 1 tablet by mouth daily.   valACYclovir 500 MG tablet Commonly known as: VALTREX Take 500 mg by mouth See admin instructions. Take one tablet (500 mg) by mouth daily at bedtime, take four tablets (2000 mg) once as one  dose as needed at onset of fever blister   VITAMIN D PO Take 1 tablet by mouth See admin instructions. Take one tablet by mouth daily on Monday thru Friday (skip Saturday and Sunday)      Past Medical History:  Diagnosis Date   Allergic rhinitis    per Dr. Lucie Leather   Asthma    per Dr. Lucie Leather   DJD (degenerative joint disease)    GERD (gastroesophageal reflux disease)    Hyperlipidemia    Hypertension     Past Surgical History:  Procedure Laterality Date   APPENDECTOMY     BRAVO PH STUDY N/A 03/16/2015   Procedure: BRAVO PH STUDY;   Surgeon: Charlott Rakes, MD;  Location: [facility];  Service: Endoscopy;  Laterality: N/A;   CESAREAN SECTION     ESOPHAGOGASTRODUODENOSCOPY N/A 03/16/2015   Procedure: ESOPHAGOGASTRODUODENOSCOPY (EGD);  Surgeon: Charlott Rakes, MD;  Location: [facility];  Service: Endoscopy;  Laterality: N/A;   IR ANGIO INTRA EXTRACRAN SEL COM CAROTID INNOMINATE BILAT MOD SED  04/24/2019   IR ANGIO VERTEBRAL SEL VERTEBRAL BILAT MOD SED  04/24/2019   IR US GUIDE VASC ACCESS RIGHT  04/24/2019   MYOMECTOMY     RETINAL LASER PROCEDURE  04/2021   TEMPOROMANDIBULAR JOINT SURGERY Right    TONSILLECTOMY      Review of systems negative except as noted in HPI / PMHx or noted below:  Review of Systems  Constitutional: Negative.   HENT: Negative.    Eyes: Negative.   Respiratory: Negative.    Cardiovascular: Negative.   Gastrointestinal: Negative.   Genitourinary: Negative.   Musculoskeletal: Negative.   Skin: Negative.   Neurological: Negative.   Endo/Heme/Allergies: Negative.   Psychiatric/Behavioral: Negative.       Objective:   There were no vitals filed for this visit.        Physical Exam Constitutional:      Appearance: She is not diaphoretic.  HENT:     Head: Normocephalic.     Right Ear: Tympanic membrane, ear canal and external ear normal.     Left Ear: Tympanic membrane, ear canal and external ear normal.     Nose: Nose normal. No mucosal edema or rhinorrhea.     Mouth/Throat:     Pharynx: Uvula midline. No oropharyngeal exudate.  Eyes:     Conjunctiva/sclera: Conjunctivae normal.  Neck:     Thyroid: No thyromegaly.     Trachea: Trachea normal. No tracheal tenderness or tracheal deviation.  Cardiovascular:     Rate and Rhythm: Normal rate and regular rhythm.     Heart sounds: Normal heart sounds, S1 normal and S2 normal. No murmur heard. Pulmonary:     Effort: No respiratory distress.     Breath sounds: Normal breath sounds. No stridor. No wheezing or rales.   Lymphadenopathy:     Head:     Right side of head: No tonsillar adenopathy.     Left side of head: No tonsillar adenopathy.     Cervical: No cervical adenopathy.  Skin:    Findings: No erythema or rash.     Nails: There is no clubbing.  Neurological:     Mental Status: She is alert.     Diagnostics: none    Assessment and Plan:   1. Not well controlled moderate persistent asthma   2. Post-COVID syndrome   3. Perennial allergic rhinitis   4. LPRD (laryngopharyngeal reflux disease)     1. Continue to treat and prevent inflammation:   A.  Flonase 1-2 sprays each nostril one time per day  B. Flovent 110 - 2 inhalations 1 time per day  C. Montelukast 10mg  one time per day  2.  Continue to treat and prevent reflux/LPR:     A. Famotidine 40 mg - 1 tablet 1 time per day  3. If needed:   A. Antihistamine   B. Proair HFA - 2 inhalations or Xopenex neb every 4-6 hours  C. Mucinex DM - 2 tablets two times per day  4. "Action Plan" for flare up:   A. Increase Flovent 110 up to 3 inhalations 3 times per day  B. Add Nexium 40 mg twice a day   5. For this recent event:   A.  Prednisone 10 mg - 1 tablet 1 time per day for 10 days only  6. Return to clinic in 12 months or earlier if problem  7.  Obtain flu vaccine and RSV vaccine.  Hopefully Brenda Daniel will go back to her usual functioning status which has been pretty good when addressing issues of respiratory tract inflammation and reflux induced respiratory disease with the medications noted above.  She will continue on her "action plan" and I given her a very short course of systemic steroids to use to finish off her COVID induced respiratory tract irritation and inflammation.  Assuming she does well with this plan I will see her back in this clinic in 1 year or earlier if there is a problem.    Santina Evans, MD Allergy / Immunology [facility]

## 2022-02-13 NOTE — Patient Instructions (Signed)
   1. Continue to treat and prevent inflammation:   A. Flonase 1-2 sprays each nostril one time per day  B. Flovent 110 - 2 inhalations 1 time per day  C. Montelukast 10mg  one time per day  2.  Continue to treat and prevent reflux/LPR:     A. Famotidine 40 mg - 1 tablet 1 time per day  3. If needed:   A. Antihistamine   B. Proair HFA - 2 inhalations or Xopenex neb every 4-6 hours  C. Mucinex DM - 2 tablets two times per day  4. "Action Plan" for flare up:   A. Increase Flovent 110 up to 3 inhalations 3 times per day  B. Add Nexium 40 mg twice a day   5. For this recent event:   A.  Prednisone 10 mg - 1 tablet 1 time per day for 10 days only  6. Return to clinic in 12 months or earlier if problem  7.  Obtain flu vaccine and RSV vaccine.

## 2022-02-14 ENCOUNTER — Encounter: Payer: Self-pay | Admitting: Allergy and Immunology

## 2022-02-14 MED ORDER — MONTELUKAST SODIUM 10 MG PO TABS: 10.0000 mg | ORAL_TABLET | Freq: Every evening | ORAL | 4 refills | Status: DC

## 2022-02-14 MED ORDER — ALBUTEROL SULFATE HFA 108 (90 BASE) MCG/ACT IN AERS: INHALATION_SPRAY | RESPIRATORY_TRACT | 1 refills | Status: DC

## 2022-02-14 MED ORDER — FLUTICASONE PROPIONATE 50 MCG/ACT NA SUSP: 1.0000 | Freq: Every day | NASAL | 4 refills | Status: DC

## 2022-02-14 MED ORDER — LEVOCETIRIZINE DIHYDROCHLORIDE 5 MG PO TABS: 5.0000 mg | ORAL_TABLET | Freq: Every day | ORAL | 11 refills | Status: DC | PRN

## 2022-02-14 MED ORDER — LEVALBUTEROL HCL 0.31 MG/3ML IN NEBU: 1.0000 | INHALATION_SOLUTION | Freq: Four times a day (QID) | RESPIRATORY_TRACT | 3 refills | Status: DC | PRN

## 2022-02-14 MED ORDER — FLOVENT HFA 110 MCG/ACT IN AERO: 2.0000 | INHALATION_SPRAY | Freq: Every day | RESPIRATORY_TRACT | 4 refills | Status: DC

## 2022-02-14 MED ORDER — FAMOTIDINE 40 MG PO TABS: 40.0000 mg | ORAL_TABLET | Freq: Every day | ORAL | 4 refills | Status: DC

## 2022-02-14 MED ORDER — ALBUTEROL SULFATE (2.5 MG/3ML) 0.083% IN NEBU: 2.5000 mg | INHALATION_SOLUTION | RESPIRATORY_TRACT | 3 refills | Status: DC | PRN

## 2022-02-14 NOTE — Addendum Note (Signed)
Addended by: Clovis Cao A on: 02/14/2022 12:17 PM   Modules accepted: Orders

## 2022-02-21 NOTE — Telephone Encounter (Signed)
Flovent HFA PA....  KEY: [id number] verified correct insurance used: [employer] is advising to soon to process PA. I will contact customer service on tomorrow, Thursday for status - [phone].

## 2022-03-20 ENCOUNTER — Ambulatory Visit: Payer: Commercial Managed Care - PPO | Attending: Internal Medicine | Admitting: Internal Medicine

## 2022-03-20 ENCOUNTER — Encounter: Payer: Self-pay | Admitting: Internal Medicine

## 2022-03-20 VITALS — BP 130/78 | HR 72 | Ht 64.0 in | Wt 181.2 lb

## 2022-03-20 DIAGNOSIS — J4541 Moderate persistent asthma with (acute) exacerbation: Secondary | ICD-10-CM

## 2022-03-20 DIAGNOSIS — I10 Essential (primary) hypertension: Secondary | ICD-10-CM

## 2022-03-20 DIAGNOSIS — K219 Gastro-esophageal reflux disease without esophagitis: Secondary | ICD-10-CM

## 2022-03-20 DIAGNOSIS — R42 Dizziness and giddiness: Secondary | ICD-10-CM

## 2022-03-20 DIAGNOSIS — H349 Unspecified retinal vascular occlusion: Secondary | ICD-10-CM | POA: Diagnosis not present

## 2022-03-20 DIAGNOSIS — G5602 Carpal tunnel syndrome, left upper limb: Secondary | ICD-10-CM

## 2022-03-20 DIAGNOSIS — E785 Hyperlipidemia, unspecified: Secondary | ICD-10-CM

## 2022-03-20 DIAGNOSIS — R7303 Prediabetes: Secondary | ICD-10-CM

## 2022-03-20 NOTE — Patient Instructions (Signed)
Medication Instructions:  No Changes In Medications at this time.  *If you need a refill on your cardiac medications before your next appointment, please call your pharmacy*  Follow-Up: At [facility], you and your health needs are our priority.  As part of our continuing mission to provide you with exceptional heart care, we have created designated Provider Care Teams.  These Care Teams include your primary Cardiologist (physician) and Advanced Practice Providers (APPs -  Physician Assistants and Nurse Practitioners) who all work together to provide you with the care you need, when you need it.  Your next appointment:   1 year(s)  The format for your next appointment:   In Person  Provider:   Gayatri A Acharya, MD          

## 2022-03-20 NOTE — Progress Notes (Signed)
Cardiology Office Note:    Date:  03/20/2022  ID:  Brenda Daniel, DOB 07/23/1955, MRN [id number]  PCP:  Laurann Montana, MD  Cardiologist:  Parke Poisson, MD  Electrophysiologist:  None   Referring MD: Laurann Montana, MD   Chief Complaint:  Cardiovascular follow up  History of Present Illness:    Brenda Daniel is a 66 y.o. female with a history of hypertension, hyperlipidemia, prediabetes, GERD. She is coming in today for 1-year follow-up. She works as a Education administrator. She had COVID in July in 2022 and July 2023. She enjoys yoga.  At her last visit with me on 03/16/2021, she was doing well without any particular concerns.  Today, she states that she has been doing okay. However, she had an episode of asthma exacerbation with seasonal allergies and smoke exposure over the Summer. She had a chest xray on 10/12/21 which showed a tortuous thoracic aorta. She has some questions regarding this finding. She states that her mother was diagnosed with an aortic aneurysm in her 18s but never had problems with this.  We reviewed chest x-rays dating back to 2020 in detail and I shared these images with the patient in the room today.  Likely unchanged tortuosity of the ascending aorta on image review.  Normal caliber thoracic aorta on coronary CTA in 2019, personally measured.  She has not had any recurrence of chest heaviness since her initial evaluation.  She states that her blood pressure has been well controlled in the 120s systolic.  She reports good control of her reflux with daily Pepcid use and prn Nexium.  She reports having some shortness of breath and fatigue. She attributes this to her deconditioning as she has been dealing with depression since losing her mother in August. She also notes that this may be lingering symptoms after having COVID. She had COVID again in July 2023 after a trip to [place]. She reports being much sicker than with her previous episode of COVID, and notes that  she had to miss 1 week of work with her illness. She received Paxlovid with improvement.  She reports a whooshing sound in her left ear which she states is due to the proximity of her carotid to her ear. She is followed by ENT Dr. Dorma Russell and saw him on 02/16/22. He attributed the worsening of her pulsatile tinnitus at that time to her diet while traveling and increased salt intake.  She continues to have annual opthalmology exams following her retinal vascular occlusion of left eye. Patient reports having a retinal tear this past year which was treated urgently. She has not been on statins in quite some time due to concern for possible myalgias.   She states that she had lost 30lbs intentionally but gained 15lbs back given the challenges she has faced this year. She feels that her diet is well balanced she just tends to large portions.   The patient denies chest pain, chest pressure, PND, orthopnea, or leg swelling. Denies cough, fever, chills, nausea, or vomiting. Denies syncope, presyncope, or snoring. Denies dizziness or lightheadedness.   Past Medical History:  Diagnosis Date   Allergic rhinitis    per Dr. Lucie Leather   Asthma    per Dr. Lucie Leather   DJD (degenerative joint disease)    GERD (gastroesophageal reflux disease)    Hyperlipidemia    Hypertension     Past Surgical History:  Procedure Laterality Date   APPENDECTOMY     BRAVO PH STUDY N/A 03/16/2015  Procedure: BRAVO PH STUDY;  Surgeon: Charlott RakesVincent Schooler, MD;  Location: [facility];  Service: Endoscopy;  Laterality: N/A;   CESAREAN SECTION     ESOPHAGOGASTRODUODENOSCOPY N/A 03/16/2015   Procedure: ESOPHAGOGASTRODUODENOSCOPY (EGD);  Surgeon: Charlott RakesVincent Schooler, MD;  [facility];  Service: Endoscopy;  Laterality: N/A;   IR ANGIO INTRA EXTRACRAN SEL COM CAROTID INNOMINATE BILAT MOD SED  04/24/2019   IR ANGIO VERTEBRAL SEL VERTEBRAL BILAT MOD SED  04/24/2019   IR US GUIDE VASC ACCESS RIGHT  04/24/2019   MYOMECTOMY      RETINAL LASER PROCEDURE  04/2021   TEMPOROMANDIBULAR JOINT SURGERY Right    TONSILLECTOMY      Current Medications: Current Meds  Medication Sig   Cholecalciferol (VITAMIN D PO) Take 1 tablet by mouth See admin instructions. Take one tablet by mouth daily on Monday thru Friday (skip Saturday and Sunday)   esomeprazole (NEXIUM) 40 MG capsule Take 1 capsule (40 mg total) by mouth 2 (two) times daily before a meal. (Patient taking differently: Take 40 mg by mouth as needed.)   famotidine (PEPCID) 40 MG tablet Take 1 tablet (40 mg total) by mouth daily.   FLOVENT HFA 110 MCG/ACT inhaler Inhale 2 puffs into the lungs daily. INHALE 2 PUFFS INTO THE LUNGS 1 (ONE) TIME DAILY.   fluticasone (FLONASE) 50 MCG/ACT nasal spray Place 1 spray into both nostrils daily.   gelatin 650 MG capsule Take 650 mg by mouth See admin instructions. Mon - Fri   levalbuterol (XOPENEX) 0.31 MG/3ML nebulizer solution Take 3 mLs (0.31 mg total) by nebulization every 6 (six) hours as needed for wheezing.   levocetirizine (XYZAL) 5 MG tablet Take 1 tablet (5 mg total) by mouth daily as needed for allergies (Can take an extra dose during flare ups.).   losartan (COZAAR) 50 MG tablet Take 50 mg by mouth daily.   montelukast (SINGULAIR) 10 MG tablet Take 1 tablet (10 mg total) by mouth at bedtime.   Potassium 99 MG TABS Take 1 tablet by mouth at bedtime.    traZODone (DESYREL) 50 MG tablet Take 25 mg by mouth at bedtime.   triamterene-hydrochlorothiazide (MAXZIDE) 75-50 MG tablet Take 1 tablet by mouth daily.   valACYclovir (VALTREX) 500 MG tablet Take 500 mg by mouth See admin instructions. Take one tablet (500 mg) by mouth daily at bedtime, take four tablets (2000 mg) once as one dose as needed at onset of fever blister   [DISCONTINUED] albuterol (PROVENTIL) (2.5 MG/3ML) 0.083% nebulizer solution Take 3 mLs (2.5 mg total) by nebulization every 4 (four) hours as needed for wheezing or shortness of breath.   [DISCONTINUED]  albuterol (VENTOLIN HFA) 108 (90 Base) MCG/ACT inhaler Can inhale two puffs every four to six hours as needed for cough or wheeze.   [DISCONTINUED] chlorpheniramine-HYDROcodone (TUSSIONEX PENNKINETIC ER) 10-8 MG/5ML Can take 2.5 to 5.0 mL every 12 hours if needed for cough. NARCOTIC     Allergies:   Advil [ibuprofen], Niacin and related, and Red yeast rice [cholestin]   Social History   Socioeconomic History   Marital status: Married    Spouse name: Not on file   Number of children: 2   Years of education: Not on file   Highest education level: Not on file  Occupational History   Occupation: judge  Tobacco Use   Smoking status: Never   Smokeless tobacco: Never  Vaping Use   Vaping Use: Never used  Substance and Sexual Activity   Alcohol use: Yes  Alcohol/week: 0.0 standard drinks of alcohol    Comment: glass a wine a day   Drug use: No   Sexual activity: Yes  Other Topics Concern   Not on file  Social History Narrative   Not on file   Social Determinants of Health   Financial Resource Strain: Not on file  Food Insecurity: Not on file  Transportation Needs: Not on file  Physical Activity: Not on file  Stress: Not on file  Social Connections: Not on file     Family History: The patient's family history includes Allergies in her sister; Asthma in her sister; Eczema in her sister; Heart attack in her brother, father, and paternal grandfather; Heart disease in her father; Hypertension in her brother, sister, sister, and another family member; Stroke in her father.  ROS:   Please see the history of present illness.    (+) Fatigue (+) Shortness of breath (+) Depression (+) Tinnitus All other systems reviewed and are negative.  EKGs/Labs/Other Studies Reviewed:    The following studies were reviewed today:  Chest Xray 09/19/2021: FINDINGS: A tortuous thoracic aorta is stable. Atelectasis in the lateral left lung base. The heart, hila, mediastinum, lungs, and pleura  are otherwise unremarkable. IMPRESSION: No active cardiopulmonary disease.  Carotid Arterial Duplex 10/20/19 Right Carotid: Velocities in the right ICA are consistent with a 1-39%  stenosis.  Left Carotid: Velocities in the left ICA are consistent with a 1-39%  stenosis.  Vertebrals:  Bilateral vertebral arteries demonstrate antegrade flow.  Subclavians: Normal flow hemodynamics were seen in bilateral subclavian arteries.   Echo 07/21/19  1. Left ventricular ejection fraction, by estimation, is 65 to 70%. The  left ventricle has normal function. The left ventricle has no regional  wall motion abnormalities. Left ventricular diastolic parameters are  consistent with Grade I diastolic  dysfunction (impaired relaxation). The average left ventricular global  longitudinal strain is -23.1 %.   2. Right ventricular systolic function is normal. The right ventricular  size is normal.   3. The mitral valve is normal in structure and function. Trivial mitral  valve regurgitation.   4. The aortic valve is tricuspid. Aortic valve regurgitation is not  visualized.   5. The inferior vena cava is normal in size with greater than 50%  respiratory variability, suggesting right atrial pressure of 3 mmHg.   Monitor 05/25/19 Indication: dizziness Minimum HR (bpm): 62 Maximum HR (bpm): 136 Supraventricular Ectopy: <1%  Ventricular Ectopy: <1% Ventricular Tachycardia: none Pauses: none AV block: none Atrial fibrillation: none Diary events: none IMPRESSION: Infrequent ectopy, no pauses or atrial fibrillation.   CT Coronary Morph 05/08/18 Coronary calcium score: The patient's coronary artery calcium score is 0, which places the patient in the 0 percentile. Coronary arteries: Normal coronary origins.  Right dominance. Right Coronary Artery: No detectable plaque or stenosis. Patent PDA and posterolateral artery. Left Main Coronary Artery: No detectable plaque or stenosis. Left Anterior Descending  Coronary Artery: Minimal atherosclerotic plaque in the proximal LAD (<25% stenosis). Patent diagonal branches. Left Circumflex Artery: Patent small caliber left circumflex artery without detectable plaque or stenosis. Aorta:  Normal size.  No calcifications.  No dissection. Aortic Valve: No calcifications. Other findings: Normal pulmonary vein drainage into the left atrium. Normal left atrial appendage without a thrombus. Normal size of the pulmonary artery. IMPRESSION: 1. Coronary calcium score of 0. This was 0 percentile for age and sex matched control. 2. Normal coronary origin with right dominance. 3. Minimal CAD in the proximal LAD, CADRADS =  1.  EKG:  EKG was ordered today. EKG from today reviewed and showed sinus rhythm, low voltage QRS, and nonspecific T-wave abnormality. 03/16/21: NSR, rate 83 bpm 10/29/19: NSR, nonspecific T wave abnormality.   Recent Labs: 10/12/2021: Hemoglobin 14.2  Recent Lipid Panel No results found for: "CHOL", "TRIG", "HDL", "CHOLHDL", "VLDL", "LDLCALC", "LDLDIRECT"  Physical Exam:    VS:  BP 130/78   Pulse 72   Ht 5\' 4"  (1.626 m)   Wt 181 lb 3.2 oz (82.2 kg)   SpO2 97%   BMI 31.10 kg/m     Wt Readings from Last 5 Encounters:  03/20/22 181 lb 3.2 oz (82.2 kg)  02/13/22 178 lb 6.4 oz (80.9 kg)  10/02/21 174 lb 12.8 oz (79.3 kg)  03/16/21 160 lb 6.4 oz (72.8 kg)  02/14/21 160 lb 12.8 oz (72.9 kg)     Constitutional: No acute distress Eyes: sclera non-icteric, normal conjunctiva and lids ENMT: normal dentition, moist mucous membranes Cardiovascular: regular rhythm, normal rate, no murmurs. S1 and S2 normal. No jugular venous distention.  Respiratory: clear to auscultation bilaterally GI : normal bowel sounds, soft and nontender. No distention.   MSK: extremities warm, well perfused. No edema.  NEURO: grossly nonfocal exam, moves all extremities. PSYCH: alert and oriented x 3, normal mood and affect.   ASSESSMENT:    1. Essential  hypertension   2. Retinal vascular occlusion of left eye   3. Dizziness   4. Moderate persistent asthma with acute exacerbation   5. Hyperlipidemia, unspecified hyperlipidemia type   6. Prediabetes   7. Carpal tunnel syndrome, left upper limb   8. Gastroesophageal reflux disease, unspecified whether esophagitis present      PLAN:    Retinal vascular occlusion of left eye -No evidence of atrial fibrillation on serial evaluations. - mild carotid artery disease on carotid Doppler. Monitor unremarkable. - lipids well controlled on last check 10/2020, however are elevated LDL 166 recently.  We discussed indications for statins.  0 calcium score and minimal CAD on her coronary CTA in 2019.  I have asked her to aggressively modify her diet and lifestyle, and consider statin therapy as a preventive measure. - no definite intracardiac source of embolism.  - has annual ophthalmologic exams.  Essential hypertension - stable, BP of 130/78 today, continue Losartan 50 mg daily, Triamterene HCTZ 75-50 mg daily.  Hyperlipidemia, unspecified hyperlipidemia type - lipids previously well controlled however have gone up this year likely with life stressors. Given her risk factor profile we discussed starting Zetia vs a statin. She deferred starting a medication at this time and would like to work on dietary control.  Recheck at next follow-up with me or PCP in around 6 months.  Prediabetes - diet controlled. HbA1c 6%.  GERD - Well controlled with Nexium 40mg  prn and Pepcid 40mg  daily.  Follow up: 1 year  Total time of encounter: 30 minutes total time of encounter, including 20 minutes spent in face-to-face patient care on the date of this encounter. This time includes coordination of care and counseling regarding above mentioned problem list. Remainder of non-face-to-face time involved reviewing chart documents/testing relevant to the patient encounter and documentation in the medical record. I have  independently reviewed documentation from referring provider.   Cherlynn Kaiser, MD, [facility]   Medication Adjustments/Labs and Tests Ordered: Current medicines are reviewed at length with the patient today.  Concerns regarding medicines are outlined above.  Orders Placed This Encounter  Procedures  EKG 12-Lead    No orders of the defined types were placed in this encounter.    Patient Instructions  Medication Instructions:  No Changes In Medications at this time.  *If you need a refill on your cardiac medications before your next appointment, please call your pharmacy*  Follow-Up: At [facility], you and your health needs are our priority.  As part of our continuing mission to provide you with exceptional heart care, we have created designated Provider Care Teams.  These Care Teams include your primary Cardiologist (physician) and Advanced Practice Providers (APPs -  Physician Assistants and Nurse Practitioners) who all work together to provide you with the care you need, when you need it.  Your next appointment:   1 year(s)  The format for your next appointment:   In Person  Provider:   Parke Poisson, MD           I,Alexis Herring,acting as a scribe for Parke Poisson, MD.,have documented all relevant documentation on the behalf of Parke Poisson, MD,as directed by  Parke Poisson, MD while in the presence of Parke Poisson, MD.  I, Parke Poisson, MD, have reviewed all documentation for the visit on 03/20/2022. The documentation on today's date of service for the exam, diagnosis, procedures, and orders are all accurate and complete.

## 2022-03-27 NOTE — Telephone Encounter (Signed)
Flovent HFA 110 MCT/ACT PA....  KEY: [id number]...Marland KitchenMarland Kitchenapproved  per  [place], [place] CSR/ [facility] [phone] - patient has already picked up medication - next refill is on 04/25/22.

## 2022-05-02 ENCOUNTER — Other Ambulatory Visit: Payer: Self-pay | Admitting: Family Medicine

## 2022-05-02 DIAGNOSIS — Z1231 Encounter for screening mammogram for malignant neoplasm of breast: Secondary | ICD-10-CM

## 2022-06-01 ENCOUNTER — Other Ambulatory Visit: Payer: Self-pay | Admitting: Allergy & Immunology

## 2022-06-01 ENCOUNTER — Other Ambulatory Visit: Payer: Self-pay | Admitting: Allergy

## 2022-06-01 ENCOUNTER — Other Ambulatory Visit: Payer: Self-pay | Admitting: *Deleted

## 2022-06-01 ENCOUNTER — Telehealth: Payer: Self-pay | Admitting: Allergy and Immunology

## 2022-06-01 MED ORDER — QVAR REDIHALER 80 MCG/ACT IN AERB: 2.0000 | INHALATION_SPRAY | Freq: Two times a day (BID) | RESPIRATORY_TRACT | 5 refills | Status: DC

## 2022-06-01 NOTE — Telephone Encounter (Signed)
Prescription for QVAR has been sent in. Called and left a voicemail asking for the patient to return call to inform.

## 2022-06-01 NOTE — Telephone Encounter (Signed)
Patient called back and I advised of the QVAR and that however insurance did not want to cover it and preferred Pulmicort through a refill request. This has been routed to Dr. Nelva Bush and currently waiting for response. I informed the patient that once I hear back I will call her back with an update. Patient verbalized understanding.

## 2022-06-01 NOTE — Telephone Encounter (Signed)
Dr. Nelva Bush changed it to Pulmicort 1 puff twice daily. This has been sent in to the pharmacy. Called patient and advised of change in inhaler. Patient verbalized understanding.

## 2022-06-01 NOTE — Telephone Encounter (Signed)
Patient requested refill from pharmacy for flovent. Patient was told her insurance no longer covers flovent & was provided two alternatives: Arnuity ellipta & Qvar ready inhaler (patient believes she has used qvar before).   Patient is requesting one of the alternatives to be sent in.   [facility] [place] [address]  Best contact number: [phone]

## 2022-06-01 NOTE — Telephone Encounter (Signed)
Pts insurance does not cover qvar but covers pulmicort please advise to change in therapy

## 2022-06-03 ENCOUNTER — Other Ambulatory Visit: Payer: Self-pay | Admitting: Allergy & Immunology

## 2022-06-05 NOTE — Telephone Encounter (Signed)
We did change it to Pulmicort as instructed and the patient went and picked it up from the pharmacy.

## 2022-07-05 ENCOUNTER — Ambulatory Visit
Admission: RE | Admit: 2022-07-05 | Discharge: 2022-07-05 | Disposition: A | Discharge status: Home / Self Care | Payer: Commercial Managed Care - PPO | Source: Ambulatory Visit | Attending: Family Medicine | Admitting: Family Medicine

## 2022-07-05 DIAGNOSIS — Z1231 Encounter for screening mammogram for malignant neoplasm of breast: Secondary | ICD-10-CM

## 2022-09-11 ENCOUNTER — Other Ambulatory Visit (INDEPENDENT_AMBULATORY_CARE_PROVIDER_SITE_OTHER): Payer: Commercial Managed Care - PPO

## 2022-09-11 ENCOUNTER — Ambulatory Visit (INDEPENDENT_AMBULATORY_CARE_PROVIDER_SITE_OTHER): Payer: Commercial Managed Care - PPO | Admitting: Physician Assistant

## 2022-09-11 ENCOUNTER — Encounter: Payer: Self-pay | Admitting: Physician Assistant

## 2022-09-11 DIAGNOSIS — M2011 Hallux valgus (acquired), right foot: Secondary | ICD-10-CM | POA: Diagnosis not present

## 2022-09-11 DIAGNOSIS — M79672 Pain in left foot: Secondary | ICD-10-CM

## 2022-09-11 DIAGNOSIS — M79671 Pain in right foot: Secondary | ICD-10-CM | POA: Diagnosis not present

## 2022-09-11 NOTE — Progress Notes (Signed)
Office Visit Note   Patient: Brenda Brenda           Date of Birth: 1955-10-22           MRN: [id number] Visit Date: 09/11/2022              Requested by: Laurann Montana, MD [phone] Brenda Bren[address] [place],  [place] [address] PCP: Laurann Montana, MD   Assessment & Plan: Visit Diagnoses:  1. Pain in left foot   2. Pain in right foot     Plan: Brenda Brenda is a former patient of Dr. Cleophas Dunker.  She has seen him in the past for several things but in 2021 came in complaining of painful bunion deformities and second toe crossing over on her right foot.  She also had multiple corns and calluses.  At that time she was given instruction for shoe wear which she has done.  She continues to have difficulty especially with the right foot.  Makes it difficult for her to a lot walk long distances.  She does try to be mindful of her shoe wear but is feeling more limited.  She also on the right foot feels like she is having a rubber band that goes around her great toe.  She gets multiple corns and calluses she has a lot of fat pad atrophy in both of her feet.  She is not diabetic and is a non-smoker.  On x-ray she does have some degenerative changes in the first MTP joint and an intermetatarsal angle of 23 degrees on the right-hand side.  Briefly discussed different types of hallux valgus surgery with her and given the angle may even be a better candidate just for first MTP arthrodesis.  That being said I have offered her a follow-up with Dr. Lajoyce Corners and she would like to do this.  She thinks she is at the point where she would like to do something corrective.  She is planning a trip to [place] in a couple months and would consider surgery after that.  Follow-Up Instructions: No follow-ups on file.   Orders:  Orders Placed This Encounter  Procedures   XR Foot 2 Views Right   XR Foot 2 Views Left   No orders of the defined types were placed in this encounter.     Procedures: No procedures  performed   Clinical Data: No additional findings.   Subjective: Chief Complaint  Patient presents with   bilateral foot pain    Pain right GT and left plantar 5th MT    HPI patient is a pleasant 67 year old woman who comes in today complaining of continued hallux valgus deformities despite adjusting her shoe wear.  Her right is more symptomatic and she describes now that she feels like there is a rubber band that wraps around her toe.  She is not diabetic.  Describes her pain as moderate especially with trying to be active and doing things like walking she has tried various spacers and bunion straps but have not found them particularly comfortable  Review of Systems  All other systems reviewed and are negative.    Objective: Vital Signs: There were no vitals taken for this visit.  Physical Exam Constitutional:      Appearance: Normal appearance.  HENT:     Head: Normocephalic.  Pulmonary:     Effort: Pulmonary effort is normal.  Skin:    General: Skin is warm and dry.  Neurological:     Mental Status: She is  alert.     Ortho Exam Right foot she does have a palpable dorsalis pedis pulse.  She has a significant hallux valgus deformity with some crossing over the second toe.  She has stiffness with range of motion of the first MTP joint.  Sensation is intact she has no swelling or cellulitis.  Similar findings on the left however the deformity is not as pronounced.  She also has several calluses and has some fat pad atrophy Specialty Comments:  No specialty comments available.  Imaging: XR Foot 2 Views Right  Result Date: 09/11/2022 Radiographs of her right foot were seen in multiple projections today.  She does has hallux valgus deformity with some sclerotic changes at the first MTP joint.  Some early degenerative changes of the midfoot.  No acute fractures noted.  She has an intermetatarsal angle of 23 degrees  XR Foot 2 Views Left  Result Date: 09/11/2022 2 view  radiographs of the left foot were seen today.  Well-maintained alignment she does have hallux valgus deformity with some early degenerative change of the first MTP joint no acute fractures noted    PMFS History: Patient Active Problem List   Diagnosis Date Noted   Epicondylitis elbow, medial, right 02/17/2020   Bilateral bunions 02/17/2020   Medial epicondylitis of elbow, left 03/03/2019   Arch pain, unspecified laterality 03/03/2019   Pain in right foot 03/03/2019   Cough 05/23/2018   Moderate persistent asthma with acute exacerbation 05/23/2018   Perennial allergic rhinitis 05/23/2018   Carpal tunnel syndrome, left upper limb 02/18/2017   Pain in left hip 04/23/2016   Pain in both upper extremities 04/23/2016   Mild intermittent asthma with acute exacerbation 06/01/2015   LPRD (laryngopharyngeal reflux disease) 06/01/2015   Allergic rhinoconjunctivitis 06/01/2015   Morbid obesity 04/02/2015   GERD (gastroesophageal reflux disease) 03/16/2015   Upper airway cough syndrome 03/16/2015   Past Medical History:  Diagnosis Date   Allergic rhinitis    per Dr. Lucie Leather   Asthma    per Dr. Lucie Leather   DJD (degenerative joint disease)    GERD (gastroesophageal reflux disease)    Hyperlipidemia    Hypertension     Family History  Problem Relation Age of Onset   Heart disease Father    Heart attack Father    Stroke Father    Allergies Sister    Eczema Sister    Asthma Sister    Hypertension Sister    Hypertension Other        x 4 siblings   Heart attack Brother    Hypertension Brother    Heart attack Paternal Grandfather    Hypertension Sister     Past Surgical History:  Procedure Laterality Date   APPENDECTOMY     BRAVO PH STUDY N/A 03/16/2015   Procedure: BRAVO PH STUDY;  Surgeon: Charlott Rakes, MD;  Location: [facility];  Service: Endoscopy;  Laterality: N/A;   CESAREAN SECTION     ESOPHAGOGASTRODUODENOSCOPY N/A 03/16/2015   Procedure: ESOPHAGOGASTRODUODENOSCOPY  (EGD);  Surgeon: Charlott Rakes, MD;  Location: [facility];  Service: Endoscopy;  Laterality: N/A;   IR ANGIO INTRA EXTRACRAN SEL COM CAROTID INNOMINATE BILAT MOD SED  04/24/2019   IR ANGIO VERTEBRAL SEL VERTEBRAL BILAT MOD SED  04/24/2019   IR US GUIDE VASC ACCESS RIGHT  04/24/2019   MYOMECTOMY     RETINAL LASER PROCEDURE  04/2021   TEMPOROMANDIBULAR JOINT SURGERY Right    TONSILLECTOMY     Social History   Occupational History  Occupation: judge  Tobacco Use   Smoking status: Never   Smokeless tobacco: Never  Vaping Use   Vaping Use: Never used  Substance and Sexual Activity   Alcohol use: Yes    Alcohol/week: 0.0 standard drinks of alcohol    Comment: glass a wine a day   Drug use: No   Sexual activity: Yes

## 2022-12-06 ENCOUNTER — Encounter: Payer: Self-pay | Admitting: Orthopedic Surgery

## 2022-12-06 ENCOUNTER — Ambulatory Visit: Payer: Commercial Managed Care - PPO | Admitting: Orthopedic Surgery

## 2022-12-06 DIAGNOSIS — M21619 Bunion of unspecified foot: Secondary | ICD-10-CM | POA: Diagnosis not present

## 2022-12-06 DIAGNOSIS — M2011 Hallux valgus (acquired), right foot: Secondary | ICD-10-CM

## 2022-12-06 DIAGNOSIS — M205X9 Other deformities of toe(s) (acquired), unspecified foot: Secondary | ICD-10-CM

## 2022-12-06 NOTE — Progress Notes (Signed)
Office Visit Note   Patient: Brenda Daniel           Date of Birth: 1955/12/07           MRN: [id number] Visit Date: 12/06/2022              Requested by: Laurann Montana, MD [name] [place],  [place] [address] PCP: Laurann Montana, MD  Chief Complaint  Patient presents with   Right Foot - Pain    bunion   Left Foot - Pain    bunion      HPI: Symptomatic that affect her activities of daily living we could proceed with surgical intervention.  Surgical treatment would be a chevron and possible Akin osteotomy of the great toe and a Weil osteotomy for the second metatarsal.  Risks and benefits of surgery were discussed including infection neurovascular injury persistent pain need for additional surgery.  Patient states she will call if her symptoms worsen. Patient is a 67 year old woman who is seen for initial evaluation for clawing of the second toe bilaterally and bunions bilaterally.  Patient states she has had symptoms but is asymptomatic today.  Patient denies a history of gout.  Assessment & Plan: Visit Diagnoses:  1. Hallux valgus (acquired), right foot   2. Claw toe, acquired, unspecified laterality   3. Bunion of unspecified foot     Plan: Discussed with the patient if these become  Follow-Up Instructions: Return if symptoms worsen or fail to improve.   Ortho Exam  Patient is alert, oriented, no adenopathy, well-dressed, normal affect, normal respiratory effort. Examination patient is a good dorsalis pedis pulse bilaterally she has good ankle good subtalar motion.  She has decreased dorsiflexion of the great toe MTP joint.  She has clawing of the second toe bilaterally with the second toe on top of the great toe.  There is a prominent bunion deformity but no open ulcers.  She also has a bunionette deformity on the right foot greater than the left.  Imaging: No results found. No images are attached to the encounter.  Labs: No results found for:  "HGBA1C", "ESRSEDRATE", "CRP", "LABURIC", "REPTSTATUS", "GRAMSTAIN", "CULT", "LABORGA"   No results found for: "ALBUMIN", "PREALBUMIN", "CBC"  No results found for: "MG" No results found for: "VD25OH"  No results found for: "PREALBUMIN"    Latest Ref Rng & Units 10/12/2021    9:27 AM 04/24/2019    7:10 AM 11/30/2017   12:06 PM  CBC EXTENDED  WBC 3.4 - 10.8 x10E3/uL 10.0  5.4  5.6   RBC 3.77 - 5.28 x10E6/uL 4.47  4.49  4.64   Hemoglobin 11.1 - 15.9 g/dL 78.2  95.6  21.3   HCT 34.0 - 46.6 % 40.7  40.6  43.0   Platelets 150 - 400 K/uL  280  265   NEUT# 1.4 - 7.0 x10E3/uL 7.5  3.3    Lymph# 0.7 - 3.1 x10E3/uL 1.6  1.6       There is no height or weight on file to calculate BMI.  Orders:  No orders of the defined types were placed in this encounter.  No orders of the defined types were placed in this encounter.    Procedures: No procedures performed  Clinical Data: No additional findings.  ROS:  All other systems negative, except as noted in the HPI. Review of Systems  Objective: Vital Signs: There were no vitals taken for this visit.  Specialty Comments:  No specialty comments available.  PMFS History: Patient Active Problem List   Diagnosis Date Noted   Hallux valgus (acquired), right foot 09/11/2022   Epicondylitis elbow, medial, right 02/17/2020   Bilateral bunions 02/17/2020   Medial epicondylitis of elbow, left 03/03/2019   Arch pain, unspecified laterality 03/03/2019   Pain in right foot 03/03/2019   Cough 05/23/2018   Moderate persistent asthma with acute exacerbation 05/23/2018   Perennial allergic rhinitis 05/23/2018   Carpal tunnel syndrome, left upper limb 02/18/2017   Pain in left hip 04/23/2016   Pain in both upper extremities 04/23/2016   Mild intermittent asthma with acute exacerbation 06/01/2015   LPRD (laryngopharyngeal reflux disease) 06/01/2015   Allergic rhinoconjunctivitis 06/01/2015   Morbid obesity (HCC) 04/02/2015   GERD  (gastroesophageal reflux disease) 03/16/2015   Upper airway cough syndrome 03/16/2015   Past Medical History:  Diagnosis Date   Allergic rhinitis    per Dr. Lucie Leather   Asthma    per Dr. Lucie Leather   DJD (degenerative joint disease)    GERD (gastroesophageal reflux disease)    Hyperlipidemia    Hypertension     Family History  Problem Relation Age of Onset   Heart disease Father    Heart attack Father    Stroke Father    Allergies Sister    Eczema Sister    Asthma Sister    Hypertension Sister    Hypertension Other        x 4 siblings   Heart attack Brother    Hypertension Brother    Heart attack Paternal Grandfather    Hypertension Sister     Past Surgical History:  Procedure Laterality Date   APPENDECTOMY     BRAVO PH STUDY N/A 03/16/2015   Procedure: BRAVO PH STUDY;  Surgeon: Charlott Rakes, MD;  Location: [facility];  Service: Endoscopy;  Laterality: N/A;   CESAREAN SECTION     ESOPHAGOGASTRODUODENOSCOPY N/A 03/16/2015   Procedure: ESOPHAGOGASTRODUODENOSCOPY (EGD);  Surgeon: Charlott Rakes, MD;  Location: [facility];  Service: Endoscopy;  Laterality: N/A;   IR ANGIO INTRA EXTRACRAN SEL COM CAROTID INNOMINATE BILAT MOD SED  04/24/2019   IR ANGIO VERTEBRAL SEL VERTEBRAL BILAT MOD SED  04/24/2019   IR US GUIDE VASC ACCESS RIGHT  04/24/2019   MYOMECTOMY     RETINAL LASER PROCEDURE  04/2021   TEMPOROMANDIBULAR JOINT SURGERY Right    TONSILLECTOMY     Social History   Occupational History   Occupation: judge  Tobacco Use   Smoking status: Never   Smokeless tobacco: Never  Vaping Use   Vaping status: Never Used  Substance and Sexual Activity   Alcohol use: Yes    Alcohol/week: 0.0 standard drinks of alcohol    Comment: glass a wine a day   Drug use: No   Sexual activity: Yes

## 2023-02-20 ENCOUNTER — Other Ambulatory Visit: Payer: Self-pay | Admitting: Family Medicine

## 2023-02-20 DIAGNOSIS — E2839 Other primary ovarian failure: Secondary | ICD-10-CM

## 2023-03-14 ENCOUNTER — Telehealth: Payer: Self-pay | Admitting: Allergy and Immunology

## 2023-03-14 MED ORDER — FAMOTIDINE 40 MG PO TABS: 40.0000 mg | ORAL_TABLET | Freq: Every day | ORAL | 0 refills | Status: DC

## 2023-03-14 NOTE — Telephone Encounter (Signed)
Sent in 1 curtesy with no refills

## 2023-03-14 NOTE — Telephone Encounter (Signed)
Patient called stating she needs a courtesy refill on Famotidine sent to [facility] on [address]. Patient has an appointment with Dr. Lucie Leather on Tuesday October 29th at 3:00pm.

## 2023-03-18 ENCOUNTER — Other Ambulatory Visit: Payer: Self-pay | Admitting: Family Medicine

## 2023-03-18 DIAGNOSIS — Z1231 Encounter for screening mammogram for malignant neoplasm of breast: Secondary | ICD-10-CM

## 2023-03-19 ENCOUNTER — Ambulatory Visit (INDEPENDENT_AMBULATORY_CARE_PROVIDER_SITE_OTHER): Payer: Medicare Other | Admitting: Allergy and Immunology

## 2023-03-19 ENCOUNTER — Encounter: Payer: Self-pay | Admitting: Allergy and Immunology

## 2023-03-19 ENCOUNTER — Other Ambulatory Visit: Payer: Self-pay

## 2023-03-19 VITALS — BP 122/82 | HR 84 | Temp 97.9°F | Ht 62.25 in | Wt 183.5 lb

## 2023-03-19 DIAGNOSIS — K219 Gastro-esophageal reflux disease without esophagitis: Secondary | ICD-10-CM | POA: Diagnosis not present

## 2023-03-19 DIAGNOSIS — J4540 Moderate persistent asthma, uncomplicated: Secondary | ICD-10-CM | POA: Diagnosis not present

## 2023-03-19 DIAGNOSIS — J3089 Other allergic rhinitis: Secondary | ICD-10-CM

## 2023-03-19 MED ORDER — PULMICORT FLEXHALER 180 MCG/ACT IN AEPB: 2.0000 | INHALATION_SPRAY | Freq: Every morning | RESPIRATORY_TRACT | 3 refills | Status: DC

## 2023-03-19 MED ORDER — ESOMEPRAZOLE MAGNESIUM 40 MG PO CPDR: 40.0000 mg | DELAYED_RELEASE_CAPSULE | Freq: Two times a day (BID) | ORAL | 1 refills | Status: DC

## 2023-03-19 MED ORDER — NEBULIZER MISC: 1.0000 | 1 refills | Status: AC

## 2023-03-19 MED ORDER — ALBUTEROL SULFATE (2.5 MG/3ML) 0.083% IN NEBU: 2.5000 mg | INHALATION_SOLUTION | RESPIRATORY_TRACT | 1 refills | Status: DC | PRN

## 2023-03-19 MED ORDER — FLUTICASONE PROPIONATE 50 MCG/ACT NA SUSP: 1.0000 | Freq: Every day | NASAL | 3 refills | Status: DC

## 2023-03-19 MED ORDER — FAMOTIDINE 40 MG PO TABS: 40.0000 mg | ORAL_TABLET | Freq: Every morning | ORAL | 3 refills | Status: DC

## 2023-03-19 MED ORDER — LEVOCETIRIZINE DIHYDROCHLORIDE 5 MG PO TABS: 5.0000 mg | ORAL_TABLET | Freq: Every day | ORAL | 3 refills | Status: DC | PRN

## 2023-03-19 MED ORDER — MONTELUKAST SODIUM 10 MG PO TABS: 10.0000 mg | ORAL_TABLET | Freq: Every evening | ORAL | 3 refills | Status: DC

## 2023-03-19 NOTE — Patient Instructions (Addendum)
   1. Continue to treat and prevent inflammation:   A. Flonase 1-2 sprays each nostril one time per day  B. Pulmicort 180 - 2 inhalations 1 time per day  C. Montelukast 10mg  one time per day  2.  Continue to treat and prevent reflux/LPR:     A. Famotidine 40 mg - 1 tablet 1 time per day  B. Minimize caffeine and chocolate consumption  3. If needed:   A. Antihistamine   B. Albuterol + Pulmicort 180 - 2 inhalations TOGETHER or nebulization every 4-6 hours  C. Mucinex DM - 2 tablets two times per day  4. "Action Plan" for flare up:   A.  Add Nexium 40 mg twice a day   5. Return to clinic in 12 months or earlier if problem

## 2023-03-19 NOTE — Progress Notes (Unsigned)
[place] - [place] - [place] - [place] - [place]   Follow-up Note  Referring Provider: Laurann Montana, MD Primary Provider: Laurann Montana, MD Date of Office Visit: 03/19/2023  Subjective:   Brenda Daniel (DOB: 1955-09-22) is a 67 y.o. female who [facility] in re-evaluation of the following:  HPI: Brenda Daniel returns to this clinic in reevaluation of asthma, allergic rhinitis, LPR.  I last saw her in this clinic 13 February 2022.  Brenda Daniel's issue is a chronic cough and for the most part she has had this under very good control while using anti-inflammatory agents for her airway and therapy directed against reflux.  During this winter she increased her Pulmicort to a full dose of 2 and elations twice a day as she did have a little bit more cough especially when she went outdoors in the heat.  And, on occasion she will develop a flare of coughing with throat clearing for which she will increase her treatment for reflux by adding in Nexium twice a day to her chronic famotidine use.  While utilizing the plan noted above overall she thinks she is done pretty well and she rarely uses any albuterol and her coughing has not interfered with her ability to perform activities or to do anything that she would like to do.  She has had very little problems with her upper airway wheezing montelukast and Flonase.  She did obtain the flu vaccine last year and obtain the RSV vaccine last year and also obtained this years flu vaccine.  Allergies as of 03/19/2023       Reactions   Advil [ibuprofen] Other (See Comments)   Dizzy; any NSAIDS    Niacin And Related Other (See Comments)   dizzy   Red Yeast Rice [cholestin] Other (See Comments)   dizzy        Medication List    esomeprazole 40 MG capsule Commonly known as: NEXIUM TAKE 1 CAPSULE (40 MG TOTAL) BY MOUTH 2 (TWO) TIMES DAILY BEFORE A MEAL.   famotidine 40 MG tablet Commonly  known as: PEPCID Take 1 tablet (40 mg total) by mouth daily.   fluticasone 50 MCG/ACT nasal spray Commonly known as: FLONASE Place 1 spray into both nostrils daily.   gelatin 650 MG capsule Take 650 mg by mouth See admin instructions. Mon - Fri   levalbuterol 0.31 MG/3ML nebulizer solution Commonly known as: XOPENEX Take 3 mLs (0.31 mg total) by nebulization every 6 (six) hours as needed for wheezing.   levocetirizine 5 MG tablet Commonly known as: XYZAL Take 1 tablet (5 mg total) by mouth daily as needed for allergies (Can take an extra dose during flare ups.).   losartan 50 MG tablet Commonly known as: COZAAR Take 50 mg by mouth daily.   montelukast 10 MG tablet Commonly known as: SINGULAIR Take 1 tablet (10 mg total) by mouth at bedtime.   nitroGLYCERIN 0.4 MG SL tablet Commonly known as: NITROSTAT Place 1 tablet (0.4 mg total) under the tongue every 5 (five) minutes as needed for chest pain. What changed: additional instructions   Potassium 99 MG Tabs Take 1 tablet by mouth at bedtime.   Pulmicort Flexhaler 180 MCG/ACT inhaler Generic drug: budesonide Inhale 1 puff into the lungs in the morning and at bedtime.   traZODone 50 MG tablet Commonly known as: DESYREL Take 25 mg by mouth at bedtime.   triamterene-hydrochlorothiazide 75-50 MG tablet Commonly known as: MAXZIDE Take 1 tablet by  mouth daily.   valACYclovir 500 MG tablet Commonly known as: VALTREX Take 500 mg by mouth See admin instructions. Take one tablet (500 mg) by mouth daily at bedtime, take four tablets (2000 mg) once as one dose as needed at onset of fever blister   VITAMIN D PO Take 1 tablet by mouth See admin instructions. Take one tablet by mouth daily on Monday thru Friday (skip Saturday and Sunday)    Past Medical History:  Diagnosis Date   Allergic rhinitis    per Dr. Lucie Leather   Asthma    per Dr. Lucie Leather   DJD (degenerative joint disease)    GERD (gastroesophageal reflux disease)     Hyperlipidemia    Hypertension     Past Surgical History:  Procedure Laterality Date   APPENDECTOMY     BRAVO PH STUDY N/A 03/16/2015   Procedure: BRAVO PH STUDY;  Surgeon: Charlott Rakes, MD;  Location: [facility];  Service: Endoscopy;  Laterality: N/A;   CESAREAN SECTION     ESOPHAGOGASTRODUODENOSCOPY N/A 03/16/2015   Procedure: ESOPHAGOGASTRODUODENOSCOPY (EGD);  Surgeon: Charlott Rakes, MD;  Location: [facility];  Service: Endoscopy;  Laterality: N/A;   IR ANGIO INTRA EXTRACRAN SEL COM CAROTID INNOMINATE BILAT MOD SED  04/24/2019   IR ANGIO VERTEBRAL SEL VERTEBRAL BILAT MOD SED  04/24/2019   IR US GUIDE VASC ACCESS RIGHT  04/24/2019   MYOMECTOMY     RETINAL LASER PROCEDURE  04/2021   TEMPOROMANDIBULAR JOINT SURGERY Right    TONSILLECTOMY      Review of systems negative except as noted in HPI / PMHx or noted below:  Review of Systems  Constitutional: Negative.   HENT: Negative.    Eyes: Negative.   Respiratory: Negative.    Cardiovascular: Negative.   Gastrointestinal: Negative.   Genitourinary: Negative.   Musculoskeletal: Negative.   Skin: Negative.   Neurological: Negative.   Endo/Heme/Allergies: Negative.   Psychiatric/Behavioral: Negative.       Objective:   Vitals:   03/19/23 1504  BP: (!) 120/90  Pulse: 84  Temp: 97.9 F (36.6 C)  SpO2: 96%   Height: 5' 2.25" (158.1 cm)  Weight: 183 lb 8 oz (83.2 kg)   Physical Exam Constitutional:      Appearance: She is not diaphoretic.  HENT:     Head: Normocephalic.     Right Ear: Tympanic membrane, ear canal and external ear normal.     Left Ear: Tympanic membrane, ear canal and external ear normal.     Nose: Nose normal. No mucosal edema or rhinorrhea.     Mouth/Throat:     Pharynx: Uvula midline. No oropharyngeal exudate.  Eyes:     Conjunctiva/sclera: Conjunctivae normal.  Neck:     Thyroid: No thyromegaly.     Trachea: Trachea normal. No tracheal tenderness or tracheal deviation.   Cardiovascular:     Rate and Rhythm: Normal rate and regular rhythm.     Heart sounds: Normal heart sounds, S1 normal and S2 normal. No murmur heard. Pulmonary:     Effort: No respiratory distress.     Breath sounds: Normal breath sounds. No stridor. No wheezing or rales.  Lymphadenopathy:     Head:     Right side of head: No tonsillar adenopathy.     Left side of head: No tonsillar adenopathy.     Cervical: No cervical adenopathy.  Skin:    Findings: No erythema or rash.     Nails: There is no clubbing.  Neurological:  Mental Status: She is alert.     Diagnostics:    Spirometry was not performed secondary to recent gum surgery  Assessment and Plan:   1. Asthma, moderate persistent, well-controlled   2. Perennial allergic rhinitis   3. LPRD (laryngopharyngeal reflux disease)     1. Continue to treat and prevent inflammation:   A. Flonase 1-2 sprays each nostril one time per day  B. Pulmicort 180 - 2 inhalations 1 time per day  C. Montelukast 10mg  one time per day  2.  Continue to treat and prevent reflux/LPR:     A. Famotidine 40 mg - 1 tablet 1 time per day  B. Minimize caffeine and chocolate consumption  3. If needed:   A. Antihistamine   B. Albuterol + Pulmicort 180 - 2 inhalations TOGETHER or nebulization every 4-6 hours  C. Mucinex DM - 2 tablets two times per day  4. "Action Plan" for flare up:   A.  Add Nexium 40 mg twice a day   5. Return to clinic in 12 months or earlier if problem  Overall Brenda Daniel has done relatively well on her current plan of using anti-inflammatory agents for her airway and therapy directed against reflux to control her respiratory tract symptoms and she will remain on this plan.  She has a very good idea about her disease state and how her medications work and appropriate dosing of her medications depending on disease activity.  Assuming she does well with this plan I will see her back in this clinic in 1 year or earlier if  there is a problem.   Laurette Schimke, MD Allergy / Immunology [facility]

## 2023-03-20 ENCOUNTER — Encounter: Payer: Self-pay | Admitting: Allergy and Immunology

## 2023-03-21 ENCOUNTER — Encounter: Payer: Self-pay | Admitting: Internal Medicine

## 2023-03-21 ENCOUNTER — Ambulatory Visit: Payer: BC Managed Care – PPO | Attending: Internal Medicine | Admitting: Internal Medicine

## 2023-03-21 VITALS — BP 104/80 | HR 80 | Ht 63.0 in | Wt 184.0 lb

## 2023-03-21 DIAGNOSIS — I10 Essential (primary) hypertension: Secondary | ICD-10-CM | POA: Diagnosis not present

## 2023-03-21 DIAGNOSIS — R7303 Prediabetes: Secondary | ICD-10-CM

## 2023-03-21 DIAGNOSIS — H349 Unspecified retinal vascular occlusion: Secondary | ICD-10-CM | POA: Diagnosis not present

## 2023-03-21 DIAGNOSIS — R072 Precordial pain: Secondary | ICD-10-CM | POA: Diagnosis not present

## 2023-03-21 DIAGNOSIS — J4541 Moderate persistent asthma with (acute) exacerbation: Secondary | ICD-10-CM

## 2023-03-21 DIAGNOSIS — E785 Hyperlipidemia, unspecified: Secondary | ICD-10-CM | POA: Diagnosis not present

## 2023-03-21 MED ORDER — METOPROLOL TARTRATE 100 MG PO TABS: 100.0000 mg | ORAL_TABLET | Freq: Once | ORAL | 0 refills | Status: DC

## 2023-03-21 NOTE — Progress Notes (Signed)
Cardiology Office Note:  .   Date:  03/21/2023  ID:  Brenda Daniel, DOB 14-Oct-1955, MRN [id number] PCP: Laurann Montana, MD  [facility] Providers Cardiologist:  Parke Poisson, MD    History of Present Illness: .   Brenda Daniel is a 67 y.o. female.  Discussed the use of AI scribe software for clinical note transcription with the patient, who gave verbal consent to proceed.  History of Present Illness   The patient, with a history of asthma and hypertension, presents for a routine check-up. She reports a difficult summer due to heat and allergies, which led to episodes of shortness of breath and chest pressure. The patient believes these symptoms are related to her asthma, as they would subside upon cooling off indoors. She also reports a persistent cough, which she identifies as her main asthma symptom. The patient's asthma is currently managed with Pulmicort, which she reports as generally effective, except during extreme heat. The patient's hypertension is managed with losartan and triamterene-HCTZ, which she reports as effective. The patient's sister recently had a mini stroke, which has raised concerns about her own cardiovascular health. The patient is open to further testing to assess her risk of heart disease.        ROS: negative except per HPI above.  Studies Reviewed: Marland Kitchen   EKG Interpretation Date/Time:  Thursday March 21 2023 08:07:49 EDT Ventricular Rate:  80 PR Interval:  170 QRS Duration:  86 QT Interval:  390 QTC Calculation: 449 R Axis:   25  Text Interpretation: Normal sinus rhythm Low voltage QRS Nonspecific T wave abnormality Confirmed by Weston Brass ([id number]) on 03/21/2023 8:37:44 AM    Results   LABS LDL: 156 A1c: 6  RADIOLOGY Coronary CT: No calcium, minimal plaque  DIAGNOSTIC EKG: Normal     Risk Assessment/Calculations:             Physical Exam:   VS:  BP 104/80 (BP Location: Right Arm, Patient Position: Sitting, Cuff  Size: Normal)   Pulse 80   Ht 5\' 3"  (1.6 m)   Wt 184 lb (83.5 kg)   BMI 32.59 kg/m    Wt Readings from Last 3 Encounters:  03/21/23 184 lb (83.5 kg)  03/19/23 183 lb 8 oz (83.2 kg)  03/20/22 181 lb 3.2 oz (82.2 kg)     Physical Exam   VITALS: BP- 104/80 CHEST: Lungs clear, no wheezing CARDIOVASCULAR: Heart sounds normal     GEN: Well nourished, well developed in no acute distress NECK: No JVD; No carotid bruits CARDIAC: RRR, no murmurs, rubs, gallops RESPIRATORY:  Clear to auscultation without rales, wheezing or rhonchi  ABDOMEN: Soft, non-tender, non-distended EXTREMITIES:  No edema; No deformity   ASSESSMENT AND PLAN: .    1. Essential hypertension   2. Hyperlipidemia, unspecified hyperlipidemia type   3. Precordial pain   4. Retinal vascular occlusion of left eye   5. Moderate persistent asthma with acute exacerbation   6. Prediabetes     Assessment and Plan    Asthma Provokable symptoms with heat, improved with cooler weather. No major episodes requiring steroids this year. Main symptom is coughing. Currently on Pulmicort. -Continue current management with Pulmicort.  Hyperlipidemia LDL cholesterol elevated at 156. Family history of heart disease. Previous coronary CT 5 years ago showed minimal plaque. -Order coronary CT angiogram with plaque analysis to reassess risk level and progression of disease with chest pain episodes with exertion this summer. -Administer 100mg  of Metoprolol prior to  scan to optimize heart rate. -Consider initiation of cholesterol-lowering medication depending on results of coronary CT angiogram.  Hypertension Well controlled on current regimen of Losartan 50mg  daily and Triamterene-HCTZ 75-50mg  daily. -Continue current blood pressure management.  Retinal Vascular Occlusion Stable, annual follow-up with ophthalmologist. -Continue annual ophthalmology follow-ups.  Prediabetes A1c stable at 6. -Continue lifestyle  modifications.  Follow-up in 1 year or sooner if coronary CT angiogram results necessitate further discussion.                Total time of encounter: 30 minutes total time of encounter, including 25 minutes spent in face-to-face patient care on the date of this encounter. This time includes coordination of care and counseling regarding above mentioned problem list. Remainder of non-face-to-face time involved reviewing chart documents/testing relevant to the patient encounter and documentation in the medical record. I have independently reviewed documentation from referring provider.   Weston Brass, MD, [facility]  [facility]

## 2023-03-21 NOTE — Patient Instructions (Signed)
Medication Instructions:  Your physician recommends that you continue on your current medications as directed. Please refer to the Current Medication list given to you today.  *If you need a refill on your cardiac medications before your next appointment, please call your pharmacy*   Lab Work: BMET today If you have labs (blood work) drawn today and your tests are completely normal, you will receive your results only by: MyChart Message (if you have MyChart) OR A paper copy in the mail If you have any lab test that is abnormal or we need to change your treatment, we will call you to review the results.   Testing/Procedures:   Your cardiac CT will be scheduled at the below locations:   [facility] [address] [place], [place] [address] [phone]   If scheduled at [facility], please arrive at the [facility] Entrance (Entrance C2) of [facility] 30 minutes prior to test start time. You can use the FREE valet parking offered at entrance C (encouraged to control the heart rate for the test)  Proceed to the [facility] Radiology Department (first floor) to check-in and test prep.  All radiology patients and guests should use entrance C2 at [facility], accessed from [facility], even though the hospital's physical address listed is [address].    There is spacious parking and easy access to the radiology department from the [facility] entrance. Please enter here and check-in with the desk attendant.   Please follow these instructions carefully (unless otherwise directed):  An IV will be required for this test and Nitroglycerin will be given.   On the Night Before the Test: Be sure to Drink plenty of water. Do not consume any caffeinated/decaffeinated beverages or chocolate 12 hours prior to your test. Do not take any antihistamines 12 hours prior to your test.  On the Day of the  Test: Drink plenty of water until 1 hour prior to the test. Do not eat any food 1 hour prior to test. You may take your regular medications prior to the test.  Take metoprolol (Lopressor) 100 mg two hours prior to test. If you take Furosemide/Hydrochlorothiazide/Spironolactone, please HOLD on the morning of the test. FEMALES- please wear underwire-free bra if available, avoid dresses & tight clothing       After the Test: Drink plenty of water. After receiving IV contrast, you may experience a mild flushed feeling. This is normal. On occasion, you may experience a mild rash up to 24 hours after the test. This is not dangerous. If this occurs, you can take Benadryl 25 mg and increase your fluid intake. If you experience trouble breathing, this can be serious. If it is severe call 911 IMMEDIATELY. If it is mild, please call our office. If you take any of these medications: Glipizide/Metformin, Avandament, Glucavance, please do not take 48 hours after completing test unless otherwise instructed.  We will call to schedule your test 2-4 weeks out understanding that some insurance companies will need an authorization prior to the service being performed.   For more information and frequently asked questions, please visit our website : [email or web address]  For non-scheduling related questions, please contact the cardiac imaging nurse navigator should you have any questions/concerns: Cardiac Imaging Nurse Navigators Direct Office Dial: [phone]   For scheduling needs, including cancellations and rescheduling, please call [place], [phone].    Follow-Up: At [facility], you and your health needs are our  priority.  As part of our continuing mission to provide you with exceptional heart care, we have created designated Provider Care Teams.  These Care Teams include your primary Cardiologist (physician) and Advanced Practice Providers (APPs -  Physician Assistants and  Nurse Practitioners) who all work together to provide you with the care you need, when you need it.  Your next appointment:   12 month(s)  Provider:   Parke Poisson, MD

## 2023-03-22 LAB — BASIC METABOLIC PANEL
BUN/Creatinine Ratio: 18 (ref 12–28)
BUN: 16 mg/dL (ref 8–27)
CO2: 29 mmol/L (ref 20–29)
Calcium: 10 mg/dL (ref 8.7–10.3)
Chloride: 95 mmol/L — ABNORMAL LOW (ref 96–106)
Creatinine, Ser: 0.91 mg/dL (ref 0.57–1.00)
Glucose: 107 mg/dL — ABNORMAL HIGH (ref 70–99)
Potassium: 4.2 mmol/L (ref 3.5–5.2)
Sodium: 136 mmol/L (ref 134–144)
eGFR: 70 mL/min/{1.73_m2} (ref >59)

## 2023-04-04 ENCOUNTER — Telehealth (HOSPITAL_COMMUNITY): Payer: Self-pay | Admitting: *Deleted

## 2023-04-04 NOTE — Telephone Encounter (Signed)
Attempted to call patient regarding upcoming cardiac CT appointment. Left message on voicemail with name and callback number Hayley Sharpe RN Navigator Cardiac Imaging [facility] [phone] Office   

## 2023-04-04 NOTE — Telephone Encounter (Signed)
Patient returning call about her upcoming cardiac imaging study; pt verbalizes understanding of appt date/time, parking situation and where to check in, pre-test NPO status and medications ordered, and verified current allergies; name and call back number provided for further questions should they arise  Gordy Clement RN Navigator Cardiac [place] [facility] [phone] office [phone] cell  Patient to take 174m metoprolol tartrate two hours prior to her cardiac CT scan. She is aware to arrive at 12pm

## 2023-04-05 ENCOUNTER — Ambulatory Visit (HOSPITAL_COMMUNITY)
Admission: RE | Admit: 2023-04-05 | Discharge: 2023-04-05 | Disposition: A | Discharge status: Home / Self Care | Payer: BC Managed Care – PPO | Source: Ambulatory Visit | Attending: Internal Medicine | Admitting: Internal Medicine

## 2023-04-05 DIAGNOSIS — R072 Precordial pain: Secondary | ICD-10-CM | POA: Diagnosis present

## 2023-04-05 MED ORDER — NITROGLYCERIN 0.4 MG SL SUBL
SUBLINGUAL_TABLET | SUBLINGUAL | Status: AC
  Filled 2023-04-05: qty 2

## 2023-04-05 MED ORDER — IOHEXOL 350 MG/ML SOLN
100.0000 mL | Freq: Once | INTRAVENOUS | Status: AC | PRN
  Administered 2023-04-05: 100 mL via INTRAVENOUS

## 2023-04-05 MED ORDER — NITROGLYCERIN 0.4 MG SL SUBL
0.8000 mg | SUBLINGUAL_TABLET | Freq: Once | SUBLINGUAL | Status: AC
  Administered 2023-04-05: 0.8 mg via SUBLINGUAL

## 2023-04-05 NOTE — Progress Notes (Signed)
Patient tolerated CT well.  Vital signs stable encourage to drink water throughout day.Reasons explained and verbalized understanding.   

## 2023-06-26 ENCOUNTER — Encounter: Payer: Self-pay | Admitting: Allergy

## 2023-06-26 ENCOUNTER — Ambulatory Visit (INDEPENDENT_AMBULATORY_CARE_PROVIDER_SITE_OTHER): Payer: No Typology Code available for payment source | Admitting: Allergy

## 2023-06-26 ENCOUNTER — Other Ambulatory Visit: Payer: Self-pay

## 2023-06-26 VITALS — BP 134/80 | HR 106 | Temp 99.0°F | Resp 16 | Ht 61.42 in | Wt 184.8 lb

## 2023-06-26 DIAGNOSIS — L821 Other seborrheic keratosis: Secondary | ICD-10-CM | POA: Insufficient documentation

## 2023-06-26 DIAGNOSIS — J988 Other specified respiratory disorders: Secondary | ICD-10-CM

## 2023-06-26 DIAGNOSIS — E559 Vitamin D deficiency, unspecified: Secondary | ICD-10-CM | POA: Insufficient documentation

## 2023-06-26 DIAGNOSIS — J4541 Moderate persistent asthma with (acute) exacerbation: Secondary | ICD-10-CM

## 2023-06-26 DIAGNOSIS — K219 Gastro-esophageal reflux disease without esophagitis: Secondary | ICD-10-CM | POA: Diagnosis not present

## 2023-06-26 DIAGNOSIS — J3089 Other allergic rhinitis: Secondary | ICD-10-CM | POA: Diagnosis not present

## 2023-06-26 DIAGNOSIS — I10 Essential (primary) hypertension: Secondary | ICD-10-CM | POA: Insufficient documentation

## 2023-06-26 DIAGNOSIS — Z8619 Personal history of other infectious and parasitic diseases: Secondary | ICD-10-CM | POA: Insufficient documentation

## 2023-06-26 DIAGNOSIS — H938X2 Other specified disorders of left ear: Secondary | ICD-10-CM | POA: Insufficient documentation

## 2023-06-26 DIAGNOSIS — E785 Hyperlipidemia, unspecified: Secondary | ICD-10-CM | POA: Insufficient documentation

## 2023-06-26 DIAGNOSIS — G4700 Insomnia, unspecified: Secondary | ICD-10-CM | POA: Insufficient documentation

## 2023-06-26 DIAGNOSIS — N952 Postmenopausal atrophic vaginitis: Secondary | ICD-10-CM | POA: Insufficient documentation

## 2023-06-26 DIAGNOSIS — B001 Herpesviral vesicular dermatitis: Secondary | ICD-10-CM | POA: Insufficient documentation

## 2023-06-26 MED ORDER — ALBUTEROL SULFATE (2.5 MG/3ML) 0.083% IN NEBU: 2.5000 mg | INHALATION_SOLUTION | RESPIRATORY_TRACT | 1 refills | Status: DC | PRN

## 2023-06-26 MED ORDER — PREDNISONE 10 MG PO TABS: ORAL_TABLET | ORAL | 0 refills | Status: DC

## 2023-06-26 NOTE — Progress Notes (Signed)
 Follow Up Note  RE: Brenda Daniel MRN: [id number] DOB: 1955-11-03 Date of Office Visit: 06/26/2023  Referring provider: Teresa Channel, MD Primary care provider: Teresa Channel, MD  Chief Complaint: Asthma and Cough (Dry cough with chest pain - lungs feel like they are burning )  History of Present Illness: I had the pleasure of seeing Brenda Daniel for a follow up visit at the [facility] on 06/26/2023. She is a 68 y.o. female, who is being followed for asthma, allergic rhinitis and LPRD. Her previous allergy office visit was on 03/19/2023 with Dr. Kozlow. Today is a new complaint visit of asthma flare .  Discussed the use of AI scribe software for clinical note transcription with the patient, who gave verbal consent to proceed.  Brenda Daniel is a 68 year old female with asthma and allergies who presents with a worsening cough and respiratory symptoms.  She has experienced a worsening of her usual allergy-induced asthma symptoms since Monday morning, characterized by a dry cough that worsens with talking and a burning sensation in her lungs when coughing. There is also an increase in nasal discharge since last night. She reports incontinence associated with the cough, which typically occurs after prolonged coughing.  She recently traveled by airplane to [place]  on Friday and returned on Sunday, wearing a mask during the flights. She met a cousin on Friday night who later tested positive for influenza A. She performed a COVID test on Monday morning, which was negative.  Her current medication regimen includes Singulair , Pulmicort  (two puffs in the morning, increased to two puffs twice daily since Monday), and Nexium  once daily, although she forgot to increase it to twice daily as previously advised during an asthma flare. She has a rescue inhaler, albuterol , which she finds ineffective, and a nebulizer machine, which she has not used recently. She last used  albuterol  in the nebulizer in September 2023.  No fever but has experienced chills, a runny nose, shortness of breath, and chest tightness described as a burning sensation. She denies any heart issues, although she often feels a weight on her chest, which has been extensively tested in the past by cardiology. She has controlled hypertension with medication. She also takes aspirin 81mg  a few times per week. Denies any leg/calf swelling.     Assessment and Plan: [name] is a 68 y.o. female with: Moderate persistent asthma with (acute) exacerbation Onset of symptoms on Monday with dry cough, runny nose, and burning sensation in lungs. Recent exposure to cousin with confirmed Flu A. Negative COVID test on Monday. No fever but chills present. Recent travel history. Take prednisone  40mg  daily x 2 days, 30mg  daily x 2 days, 20mg  daily x 2 days and 10mg  daily x 2 days. We don't have flu test in our office - she may want to get tested for it although tamiflu works best if taken within 48 hours of symptom onset. Daily controller medication(s): Pulmicort  2 puffs once a day and rinse mouth after each use. Continue Singulair  (montelukast ) 10mg  daily at night. During respiratory infections/flares:  Increase Pulmicort  180mcg 2 puffs to twice a day for 1-2 weeks until your breathing symptoms return to baseline.  Pretreat with albuterol  2 puffs or albuterol  nebulizer.  If you need to use your albuterol  nebulizer machine back to back within 15-30 minutes with no relief then please go to the ER/urgent care for further evaluation.  May use albuterol  rescue inhaler 2 puffs or nebulizer every  4 to 6 hours as needed for shortness of breath, chest tightness, coughing, and wheezing. May use albuterol  rescue inhaler 2 puffs 5 to 15 minutes prior to strenuous physical activities. Monitor frequency of use - if you need to use it more than twice per week on a consistent basis let us  know.   Respiratory infection See  below for symptomatic management.  LPRD (laryngopharyngeal reflux disease) Increase Nexium  40mg  to twice a day during asthma flares then resume once a day dosing.   Perennial allergic rhinitis Use Flonase  (fluticasone ) nasal spray 1-2 sprays per nostril once a day as needed for nasal congestion.  Nasal saline spray (i.e., Simply Saline) or nasal saline lavage (i.e., NeilMed) is recommended as needed and prior to medicated nasal sprays.  Return in about 6 months (around 12/24/2023).  Meds ordered this encounter  Medications   albuterol  (PROVENTIL ) (2.5 MG/3ML) 0.083% nebulizer solution    Sig: Take 3 mLs (2.5 mg total) by nebulization every 4 (four) hours as needed for wheezing or shortness of breath (coughing fits).    Dispense:  75 mL    Refill:  1   predniSONE  (DELTASONE ) 10 MG tablet    Sig: Take prednisone  40mg  daily x 2 days, 30mg  daily x 2 days, 20mg  daily x 2 days and 10mg  daily x 2 days.    Dispense:  20 tablet    Refill:  0   Lab Orders  No laboratory test(s) ordered today    Diagnostics: None.   Medication List:  Current Outpatient Medications  Medication Sig Dispense Refill   albuterol  (PROVENTIL ) (2.5 MG/3ML) 0.083% nebulizer solution Take 3 mLs (2.5 mg total) by nebulization every 4 (four) hours as needed for wheezing or shortness of breath (coughing fits). 75 mL 1   Betamethasone Valerate 0.12 % foam Apply 1 Application topically daily as needed.     budesonide  (PULMICORT  FLEXHALER) 180 MCG/ACT inhaler Inhale 2 puffs into the lungs every morning. 3 each 3   Cholecalciferol (VITAMIN D PO) Take 1 tablet by mouth See admin instructions. Take one tablet by mouth daily on Monday thru Friday (skip Saturday and Sunday)     esomeprazole  (NEXIUM ) 40 MG capsule Take 1 capsule (40 mg total) by mouth 2 (two) times daily before a meal. Add during flare ups 180 capsule 1   famotidine  (PEPCID ) 40 MG tablet Take 1 tablet (40 mg total) by mouth every morning. 90 tablet 3    fluticasone  (FLONASE ) 50 MCG/ACT nasal spray Place 1 spray into both nostrils daily. 48 mL 3   gelatin 650 MG capsule Take 650 mg by mouth See admin instructions. Mon - Fri     ketoconazole (NIZORAL) 2 % shampoo Apply 1 Application topically every 3 (three) days.     levalbuterol  (XOPENEX ) 0.31 MG/3ML nebulizer solution Take 3 mLs (0.31 mg total) by nebulization every 6 (six) hours as needed for wheezing. 3 mL 3   loratadine  (CLARITIN ) 10 MG tablet Take 10 mg by mouth daily.     losartan (COZAAR) 50 MG tablet Take 50 mg by mouth daily.     montelukast  (SINGULAIR ) 10 MG tablet Take 1 tablet (10 mg total) by mouth at bedtime. 90 tablet 3   Nebulizer MISC 1 Device by Does not apply route as directed. 1 each 1   Potassium 99 MG TABS Take 1 tablet by mouth at bedtime.      predniSONE  (DELTASONE ) 10 MG tablet Take prednisone  40mg  daily x 2 days, 30mg  daily x 2 days, 20mg   daily x 2 days and 10mg  daily x 2 days. 20 tablet 0   traZODone (DESYREL) 50 MG tablet Take 25 mg by mouth at bedtime.     triamterene-hydrochlorothiazide (MAXZIDE) 75-50 MG tablet Take 1 tablet by mouth daily.     valACYclovir (VALTREX) 500 MG tablet Take 500 mg by mouth See admin instructions. Take one tablet (500 mg) by mouth daily at bedtime, take four tablets (2000 mg) once as one dose as needed at onset of fever blister     nitroGLYCERIN  (NITROSTAT ) 0.4 MG SL tablet Place 1 tablet (0.4 mg total) under the tongue every 5 (five) minutes as needed for chest pain. (Patient taking differently: Place 0.4 mg under the tongue every 5 (five) minutes as needed for chest pain. Has never had to use before, but patient does have some on hand.) 90 tablet 3   No current facility-administered medications for this visit.   Allergies: Allergies  Allergen Reactions   Advil [Ibuprofen] Other (See Comments)    Dizzy; any NSAIDS    Naproxen Other (See Comments)   Niacin And Related Other (See Comments)    dizzy   Red Yeast Rice [Cholestin] Other  (See Comments)    dizzy   I reviewed her past medical history, social history, family history, and environmental history and no significant changes have been reported from her previous visit.  Review of Systems  Constitutional:  Positive for chills. Negative for appetite change, fever and unexpected weight change.  HENT:  Positive for rhinorrhea. Negative for congestion.   Eyes:  Negative for itching.  Respiratory:  Positive for cough, chest tightness, shortness of breath and wheezing.   Cardiovascular:  Negative for chest pain.  Gastrointestinal:  Negative for abdominal pain.  Genitourinary:  Negative for difficulty urinating.  Skin:  Negative for rash.  Neurological:  Negative for headaches.    Objective: BP 134/80 (BP Location: Left Arm, Patient Position: Sitting, Cuff Size: Normal)   Pulse (!) 106   Temp 99 F (37.2 C)   Resp 16   Ht 5' 1.42 (1.56 m)   Wt 184 lb 12.8 oz (83.8 kg)   SpO2 97%   BMI 34.45 kg/m  Body mass index is 34.45 kg/m. Physical Exam Vitals and nursing note reviewed.  Constitutional:      Appearance: Normal appearance. She is well-developed.  HENT:     Head: Normocephalic and atraumatic.     Right Ear: Tympanic membrane and external ear normal.     Left Ear: Tympanic membrane and external ear normal.     Nose: Nose normal.     Mouth/Throat:     Mouth: Mucous membranes are moist.     Pharynx: Oropharynx is clear.  Eyes:     Conjunctiva/sclera: Conjunctivae normal.  Cardiovascular:     Rate and Rhythm: Normal rate and regular rhythm.     Heart sounds: Normal heart sounds. No murmur heard.    No friction rub. No gallop.  Pulmonary:     Effort: Pulmonary effort is normal.     Breath sounds: Normal breath sounds. No wheezing, rhonchi or rales.  Musculoskeletal:     Cervical back: Neck supple.  Skin:    General: Skin is warm.     Findings: No rash.  Neurological:     Mental Status: She is alert and oriented to person, place, and time.   Psychiatric:        Behavior: Behavior normal.    Previous notes and tests were reviewed. The plan  was reviewed with the patient/family, and all questions/concerned were addressed.  It was my pleasure to see Liliah today and participate in her care. Please feel free to contact me with any questions or concerns.  Sincerely,  Orlan Cramp, DO Allergy & Immunology  [facility]  [place] office: [phone] [facility] office: [phone]

## 2023-06-26 NOTE — Patient Instructions (Addendum)
 Respiratory infection/asthma flare Take prednisone  40mg  daily x 2 days, 30mg  daily x 2 days, 20mg  daily x 2 days and 10mg  daily x 2 days. I don't have flu test in our office. You may want to get tested.  Daily controller medication(s): Pulmicort  180mcg 2 puffs once a day and rinse mouth after each use. Continue Singulair  (montelukast ) 10mg  daily at night.  During respiratory infections/flares:  Increase Pulmicort  180mcg 2 puffs to twice a day for 1-2 weeks until your breathing symptoms return to baseline.  Pretreat with albuterol  2 puffs or albuterol  nebulizer.  If you need to use your albuterol  nebulizer machine back to back within 15-30 minutes with no relief then please go to the ER/urgent care for further evaluation.  May use albuterol  rescue inhaler 2 puffs or nebulizer every 4 to 6 hours as needed for shortness of breath, chest tightness, coughing, and wheezing. May use albuterol  rescue inhaler 2 puffs 5 to 15 minutes prior to strenuous physical activities. Monitor frequency of use - if you need to use it more than twice per week on a consistent basis let us  know.  Breathing control goals:  Full participation in all desired activities (may need albuterol  before activity) Albuterol  use two times or less a week on average (not counting use with activity) Cough interfering with sleep two times or less a month Oral steroids no more than once a year No hospitalizations   Increase Nexium  40mg  to twice a day during asthma flares.   Rhinitis Use Flonase  (fluticasone ) nasal spray 1-2 sprays per nostril once a day as needed for nasal congestion.  Nasal saline spray (i.e., Simply Saline) or nasal saline lavage (i.e., NeilMed) is recommended as needed and prior to medicated nasal sprays.  Follow up with Dr. Kozlow as scheduled.   Drink plenty of fluids. Water, juice, clear broth or warm lemon water are good choices. Avoid caffeine and alcohol, which can dehydrate you. Eat chicken  soup. Chicken soup and other warm fluids can be soothing and loosen congestion. Rest. Adjust your room's temperature and humidity. Keep your room warm but not overheated. If the air is dry, a cool-mist humidifier or vaporizer can moisten the air and help ease congestion and coughing. Keep the humidifier clean to prevent the growth of bacteria and molds. Soothe your throat. Perform a saltwater gargle. Dissolve one-quarter to a half teaspoon of salt in a 4- to 8-ounce glass of warm water. This can relieve a sore or scratchy throat temporarily. Use saline nasal drops. To help relieve nasal congestion, try saline nasal drops. You can buy these drops over the counter, and they can help relieve symptoms ? even in children. Take over-the-counter cold and cough medications. For adults and children older than 5, over-the-counter decongestants, antihistamines and pain relievers might offer some symptom relief. However, they won't prevent a cold or shorten its duration.

## 2023-09-02 ENCOUNTER — Ambulatory Visit (INDEPENDENT_AMBULATORY_CARE_PROVIDER_SITE_OTHER): Admitting: Allergy and Immunology

## 2023-09-02 ENCOUNTER — Encounter: Payer: Self-pay | Admitting: Allergy and Immunology

## 2023-09-02 VITALS — BP 122/86 | HR 88 | Resp 18

## 2023-09-02 DIAGNOSIS — J3089 Other allergic rhinitis: Secondary | ICD-10-CM | POA: Diagnosis not present

## 2023-09-02 DIAGNOSIS — J4540 Moderate persistent asthma, uncomplicated: Secondary | ICD-10-CM

## 2023-09-02 DIAGNOSIS — G9331 Postviral fatigue syndrome: Secondary | ICD-10-CM

## 2023-09-02 DIAGNOSIS — K219 Gastro-esophageal reflux disease without esophagitis: Secondary | ICD-10-CM | POA: Diagnosis not present

## 2023-09-02 MED ORDER — ALBUTEROL SULFATE HFA 108 (90 BASE) MCG/ACT IN AERS: INHALATION_SPRAY | RESPIRATORY_TRACT | 1 refills | Status: DC

## 2023-09-02 MED ORDER — FLUCONAZOLE 150 MG PO TABS: ORAL_TABLET | ORAL | 0 refills | Status: DC

## 2023-09-02 MED ORDER — ESOMEPRAZOLE MAGNESIUM 40 MG PO CPDR: DELAYED_RELEASE_CAPSULE | ORAL | 1 refills | Status: DC

## 2023-09-02 NOTE — Progress Notes (Unsigned)
 [place] - [place] - [place] - [place] - [place]   Follow-up Note  Referring Provider: Laurann Montana, MD Primary Provider: Laurann Montana, MD Date of Office Visit: 09/02/2023  Subjective:   Brenda Daniel (DOB: [birth year]) is a 68 y.o. female who [facility] in re-evaluation of the following:  HPI: Brenda Daniel returns to this clinic for asthma, allergic rhinitis, LPR.  I last saw her in this clinic 19 March 2023.  During the interval Brenda Daniel had to visit with Dr. Selena Batten on 26 June 2023.  Brenda Daniel ended up with influenza treated with Tamiflu during her last visit with Dr. Selena Batten and although Brenda Daniel resolved all of her systemic and constitutional symptoms and the bulk of her respiratory tract symptoms Brenda Daniel is left with a chronic cough.  This is a chronic cough that is waxing and waning and occasionally some nasal symptoms and occasionally some shortness of breath.  None of this is responsive to a short acting bronchodilator.  Brenda Daniel does not have any anosmia or headaches or ugly nasal discharge or chest pain or sputum production or associated systemic or constitutional symptoms but Brenda Daniel has noticed some voice fatigue in that her cough and her raspiness gets worse when Brenda Daniel talks..  This occurs even though Brenda Daniel has been using her Pulmicort twice a day and has been using Nexium twice a day.  Allergies as of 09/02/2023       Reactions   Advil [ibuprofen] Other (See Comments)   Dizzy; any NSAIDS    Naproxen Other (See Comments)   Niacin And Related Other (See Comments)   dizzy   Red Yeast Rice [cholestin] Other (See Comments)   dizzy        Medication List    albuterol (2.5 MG/3ML) 0.083% nebulizer solution Commonly known as: PROVENTIL Take 3 mLs (2.5 mg total) by nebulization every 4 (four) hours as needed for wheezing or shortness of breath (coughing fits).   albuterol 108 (90 Base) MCG/ACT inhaler Commonly known as: VENTOLIN HFA Inhale 2  puffs into the lungs every 4 (four) hours as needed.   benzonatate 200 MG capsule Commonly known as: TESSALON Take 200 mg by mouth as needed.   Betamethasone Valerate 0.12 % foam Apply 1 Application topically daily as needed.   esomeprazole 40 MG capsule Commonly known as: NEXIUM Take 1 capsule (40 mg total) by mouth 2 (two) times daily before a meal. Add during flare ups   famotidine 40 MG tablet Commonly known as: PEPCID Take 1 tablet (40 mg total) by mouth every morning.   fluticasone 50 MCG/ACT nasal spray Commonly known as: FLONASE Place 1 spray into both nostrils daily.   gelatin 650 MG capsule Take 650 mg by mouth See admin instructions. Mon - Fri   ketoconazole 2 % shampoo Commonly known as: NIZORAL Apply 1 Application topically every 3 (three) days.   loratadine 10 MG tablet Commonly known as: CLARITIN Take 10 mg by mouth daily.   losartan 50 MG tablet Commonly known as: COZAAR Take 50 mg by mouth daily.   montelukast 10 MG tablet Commonly known as: SINGULAIR Take 1 tablet (10 mg total) by mouth at bedtime.   Nebulizer Misc 1 Device by Does not apply route as directed.   Potassium 99 MG Tabs Take 1 tablet by mouth at bedtime.   Pulmicort Flexhaler 180 MCG/ACT inhaler Generic drug: budesonide Inhale 2 puffs into the lungs every morning.   traZODone 50 MG tablet Commonly known  as: DESYREL Take 25 mg by mouth at bedtime.   triamterene-hydrochlorothiazide 75-50 MG tablet Commonly known as: MAXZIDE Take 1 tablet by mouth daily.   valACYclovir 500 MG tablet Commonly known as: VALTREX Take 500 mg by mouth See admin instructions. Take one tablet (500 mg) by mouth daily at bedtime, take four tablets (2000 mg) once as one dose as needed at onset of fever blister   VITAMIN D PO Take 1 tablet by mouth See admin instructions. Take one tablet by mouth daily on Monday thru Friday (skip Saturday and Sunday)    Past Medical History:  Diagnosis Date    Allergic rhinitis    per Dr. Lucie Leather   Asthma    per Dr. Lucie Leather   DJD (degenerative joint disease)    GERD (gastroesophageal reflux disease)    Hyperlipidemia    Hypertension     Past Surgical History:  Procedure Laterality Date   APPENDECTOMY     BRAVO PH STUDY N/A 03/16/2015   Procedure: BRAVO PH STUDY;  Surgeon: Charlott Rakes, MD;  Location: [facility];  Service: Endoscopy;  Laterality: N/A;   CESAREAN SECTION     ESOPHAGOGASTRODUODENOSCOPY N/A 03/16/2015   Procedure: ESOPHAGOGASTRODUODENOSCOPY (EGD);  Surgeon: Charlott Rakes, MD;  Location: [facility];  Service: Endoscopy;  Laterality: N/A;   IR ANGIO INTRA EXTRACRAN SEL COM CAROTID INNOMINATE BILAT MOD SED  04/24/2019   IR ANGIO VERTEBRAL SEL VERTEBRAL BILAT MOD SED  04/24/2019   IR US GUIDE VASC ACCESS RIGHT  04/24/2019   MYOMECTOMY     RETINAL LASER PROCEDURE  04/2021   TEMPOROMANDIBULAR JOINT SURGERY Right    TONSILLECTOMY      Review of systems negative except as noted in HPI / PMHx or noted below:  Review of Systems  Constitutional: Negative.   HENT: Negative.    Eyes: Negative.   Respiratory: Negative.    Cardiovascular: Negative.   Gastrointestinal: Negative.   Genitourinary: Negative.   Musculoskeletal: Negative.   Skin: Negative.   Neurological: Negative.   Endo/Heme/Allergies: Negative.   Psychiatric/Behavioral: Negative.       Objective:   Vitals:   09/02/23 1620  BP: 122/86  Pulse: 88  Resp: 18  SpO2: 99%          Physical Exam Constitutional:      Appearance: Brenda Daniel is not diaphoretic.  HENT:     Head: Normocephalic.     Right Ear: Tympanic membrane, ear canal and external ear normal.     Left Ear: Tympanic membrane, ear canal and external ear normal.     Nose: Nose normal. No mucosal edema or rhinorrhea.     Mouth/Throat:     Pharynx: Uvula midline. No oropharyngeal exudate.  Eyes:     Conjunctiva/sclera: Conjunctivae normal.  Neck:     Thyroid: No thyromegaly.      Trachea: Trachea normal. No tracheal tenderness or tracheal deviation.  Cardiovascular:     Rate and Rhythm: Normal rate and regular rhythm.     Heart sounds: Normal heart sounds, S1 normal and S2 normal. No murmur heard. Pulmonary:     Effort: No respiratory distress.     Breath sounds: Normal breath sounds. No stridor. No wheezing or rales.  Lymphadenopathy:     Head:     Right side of head: No tonsillar adenopathy.     Left side of head: No tonsillar adenopathy.     Cervical: No cervical adenopathy.  Skin:    Findings: No erythema or rash.  Nails: There is no clubbing.  Neurological:     Mental Status: Brenda Daniel is alert.     Diagnostics: Spirometry was performed and demonstrated an FEV1 of 2.08 at 99 % of predicted.   Assessment and Plan:   1. Not well controlled moderate persistent asthma   2. LPRD (laryngopharyngeal reflux disease)   3. Perennial allergic rhinitis   4. Post-influenza syndrome    1. Continue to treat and prevent inflammation:   A. Flonase 1-2 sprays each nostril one time per day  B. Pulmicort 180 - 2 inhalations 1-2 times per day  C. Montelukast 10mg  one time per day  2.  Continue to treat and prevent reflux/LPR:     A. Famotidine 40 mg - 1 tablet 1 time per day  B. Minimize caffeine and chocolate consumption  3. For this episode:   A. Use Nexium 40 mg - 2 times per day + famotidine 40 mg - in evening  B. Use Pulmicort 180 - 2 inhalations 2 times per day  C. Mucinex DM - 2 times per day  D. Diflucan 150 - 1 tablet today  E. Further treatment??? Pulmicort side effect???  4. If needed:   A. Claritin 10 mg - 1-2 times per day  5. Return to clinic in 12 months or earlier if problem  6. Influenza = Tamiflu. Covid = Paxlovid  Geovanna appears to have some irritated airway with redevelopment of her cough and there is probably contribution from a post influenza syndrome and from her reflux induced respiratory disease and possibly from a side effect  of using prednisone and her full dose Pulmicort such as fungal infection of her airway or possibly vocal cord myopathy.  Will treat her with the plan noted above and make a determination about further evaluation and treatment based upon her response to this treatment over the course of the next few weeks.   Schuyler Custard, MD Allergy / Immunology [facility]

## 2023-09-02 NOTE — Patient Instructions (Addendum)
   1. Continue to treat and prevent inflammation:   A. Flonase 1-2 sprays each nostril one time per day  B. Pulmicort 180 - 2 inhalations 1-2 times per day  C. Montelukast 10mg  one time per day  2.  Continue to treat and prevent reflux/LPR:     A. Famotidine 40 mg - 1 tablet 1 time per day  B. Minimize caffeine and chocolate consumption  3. For this episode:   A. Use Nexium 40 mg - 2 times per day + famotidine 40 mg - in evening  B. Use Pulmicort 180 - 2 inhalations 2 times per day  C. Mucinex DM - 2 times per day  D. Diflucan 150 - 1 tablet today  E. Further treatment??? Pulmicort side effect???  4. If needed:   A. Claritin 10 mg - 1-2 times per day  5. Return to clinic in 12 months or earlier if problem  6. Influenza = Tamiflu. Covid = Paxlovid

## 2023-09-03 ENCOUNTER — Encounter: Payer: Self-pay | Admitting: Allergy and Immunology

## 2023-09-23 ENCOUNTER — Other Ambulatory Visit: Payer: Self-pay

## 2023-09-23 ENCOUNTER — Encounter: Payer: Self-pay | Admitting: Allergy and Immunology

## 2023-09-23 DIAGNOSIS — R059 Cough, unspecified: Secondary | ICD-10-CM

## 2023-09-23 DIAGNOSIS — R0602 Shortness of breath: Secondary | ICD-10-CM

## 2023-09-25 ENCOUNTER — Ambulatory Visit
Admission: RE | Admit: 2023-09-25 | Discharge: 2023-09-25 | Disposition: A | Discharge status: Home / Self Care | Source: Ambulatory Visit | Attending: Allergy and Immunology | Admitting: Allergy and Immunology

## 2023-09-27 ENCOUNTER — Other Ambulatory Visit: Payer: Self-pay | Admitting: *Deleted

## 2023-09-27 ENCOUNTER — Encounter: Payer: Self-pay | Admitting: *Deleted

## 2023-09-27 MED ORDER — BUDESONIDE-FORMOTEROL FUMARATE 160-4.5 MCG/ACT IN AERO: INHALATION_SPRAY | RESPIRATORY_TRACT | 5 refills | Status: DC

## 2023-10-07 ENCOUNTER — Encounter: Payer: Self-pay | Admitting: Allergy and Immunology

## 2023-10-07 ENCOUNTER — Ambulatory Visit (INDEPENDENT_AMBULATORY_CARE_PROVIDER_SITE_OTHER): Admitting: Allergy and Immunology

## 2023-10-07 VITALS — BP 118/82 | HR 87 | Resp 16

## 2023-10-07 DIAGNOSIS — K219 Gastro-esophageal reflux disease without esophagitis: Secondary | ICD-10-CM | POA: Diagnosis not present

## 2023-10-07 DIAGNOSIS — J3089 Other allergic rhinitis: Secondary | ICD-10-CM | POA: Diagnosis not present

## 2023-10-07 DIAGNOSIS — J4540 Moderate persistent asthma, uncomplicated: Secondary | ICD-10-CM | POA: Diagnosis not present

## 2023-10-07 MED ORDER — TEZEPELUMAB-EKKO 210 MG/1.91ML ~~LOC~~ SOSY
210.0000 mg | PREFILLED_SYRINGE | SUBCUTANEOUS | Status: AC
  Administered 2023-10-07 – 2024-06-09 (×9): 210 mg via SUBCUTANEOUS

## 2023-10-07 NOTE — Progress Notes (Signed)
 Immunotherapy   Patient Details  Name: KARLENE SOUTHARD MRN: [id number] Date of Birth: 1955-11-28  10/07/2023  Fayne Hoover started Tezspire  injection today for asthma. Sample was provided for patient.  Frequency: Every 28 days Consent signed and patient instructions given. Patient waited in office without any issues.   Keawe Marcello 10/07/2023, 5:28 PM

## 2023-10-07 NOTE — Progress Notes (Signed)
 [place] - [place] - [place] - [place] -    Follow-up Note  Referring Provider: Victorio Grave, MD Primary Provider: Victorio Grave, MD Date of Office Visit: 10/07/2023  Subjective:   NISREEN GUISE (DOB: [birth year]) is a 68 y.o. female who [facility] in re-evaluation of the following:  HPI: Zyanna returns to this clinic in evaluation of asthma, allergic rhinitis, LPR.  Her last visit to this clinic was 02 September 2023.  She has been dealing with this persistent cough that she contracted over the course of the past several months that does not appear to be improving.  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  She did not really improve very much so we changed her to Symbicort  23 Sep 2023.  As a result of that administration she has improvement in her shortness of breath and better control of her cough but she is having more laryngitis.  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  She does use a spacer with Symbicort .  She believes that her reflux is under very good control at this point.  Allergies as of 10/07/2023       Reactions   Advil [ibuprofen] Other (See Comments)   Dizzy; any NSAIDS    Naproxen Other (See Comments)   Niacin And Related Other (See Comments)   dizzy   Red Yeast Rice [cholestin] Other (See Comments)   dizzy        Medication List    albuterol  (2.5 MG/3ML) 0.083% nebulizer solution Commonly known as: PROVENTIL  Take 3 mLs (2.5 mg total) by nebulization every 4 (four) hours as needed for wheezing or shortness of breath (coughing fits).   albuterol  108 (90 Base) MCG/ACT inhaler Commonly known as: VENTOLIN  HFA Can inhale two puffs every four to six hours as needed  for cough, wheeze, shortness of breath.   benzonatate  200 MG capsule Commonly known as: TESSALON  Take 200 mg by mouth as needed.   Betamethasone Valerate 0.12 % foam Apply 1 Application topically daily as needed.   budesonide -formoterol  160-4.5 MCG/ACT inhaler Commonly known as: Symbicort  Use 2 puffs twice daily to prevent cough or wheeze. Rinse mouth after use. Use with spacer.   esomeprazole  40 MG capsule Commonly known as: NEXIUM  Take one capsule twice daily during flare-up as directed.   famotidine  40 MG tablet Commonly known as: PEPCID  Take 1 tablet (40 mg total) by mouth every morning.   fluconazole  150 MG tablet Commonly known as: Diflucan  Take one tablet once.   fluticasone  50 MCG/ACT nasal spray Commonly known as: FLONASE  Place 1 spray into both nostrils daily.   gelatin 650 MG capsule Take 650 mg by mouth See admin instructions. Mon - Fri   ketoconazole 2 % shampoo Commonly known as: NIZORAL Apply 1 Application topically every 3 (three) days.   loratadine 10 MG tablet Commonly known as: CLARITIN Take 10 mg by mouth daily.   losartan 50 MG tablet Commonly known as: COZAAR Take 50 mg by mouth daily.   montelukast  10 MG tablet Commonly known as: SINGULAIR  Take 1 tablet (10 mg total) by mouth at bedtime.   Nebulizer Misc 1 Device by Does not apply route as directed.   Potassium 99 MG Tabs Take 1 tablet by mouth  at bedtime.   Pulmicort  Flexhaler 180 MCG/ACT inhaler Generic drug: budesonide  Inhale 2 puffs into the lungs every morning.   traZODone 50 MG tablet Commonly known as: DESYREL Take 25 mg by mouth at bedtime.   triamterene-hydrochlorothiazide 75-50 MG tablet Commonly known as: MAXZIDE Take 1 tablet by mouth daily.   valACYclovir 500 MG tablet Commonly known as: VALTREX Take 500 mg by mouth See admin instructions. Take one tablet (500 mg) by mouth daily at bedtime, take four tablets (2000 mg) once as one dose as needed at onset of fever  blister   VITAMIN D PO Take 1 tablet by mouth See admin instructions. Take one tablet by mouth daily on Monday thru Friday (skip Saturday and Sunday)    Past Medical History:  Diagnosis Date   Allergic rhinitis    per Dr. Jerelene Monday   Asthma    per Dr. Jerelene Monday   DJD (degenerative joint disease)    GERD (gastroesophageal reflux disease)    Hyperlipidemia    Hypertension     Past Surgical History:  Procedure Laterality Date   APPENDECTOMY     BRAVO PH STUDY N/A 03/16/2015   Procedure: BRAVO PH STUDY;  Surgeon: Baldo Bonds, MD;  Location: [facility];  Service: Endoscopy;  Laterality: N/A;   CESAREAN SECTION     ESOPHAGOGASTRODUODENOSCOPY N/A 03/16/2015   Procedure: ESOPHAGOGASTRODUODENOSCOPY (EGD);  Surgeon: Baldo Bonds, MD;  Location: [facility];  Service: Endoscopy;  Laterality: N/A;   IR ANGIO INTRA EXTRACRAN SEL COM CAROTID INNOMINATE BILAT MOD SED  04/24/2019   IR ANGIO VERTEBRAL SEL VERTEBRAL BILAT MOD SED  04/24/2019   IR US  GUIDE VASC ACCESS RIGHT  04/24/2019   MYOMECTOMY     RETINAL LASER PROCEDURE  04/2021   TEMPOROMANDIBULAR JOINT SURGERY Right    TONSILLECTOMY      Review of systems negative except as noted in HPI / PMHx or noted below:  Review of Systems  Constitutional: Negative.   HENT: Negative.    Eyes: Negative.   Respiratory: Negative.    Cardiovascular: Negative.   Gastrointestinal: Negative.   Genitourinary: Negative.   Musculoskeletal: Negative.   Skin: Negative.   Neurological: Negative.   Endo/Heme/Allergies: Negative.   Psychiatric/Behavioral: Negative.       Objective:   Vitals:   10/07/23 1631  BP: 118/82  Pulse: 87  Resp: 16  SpO2: 97%          Physical Exam Constitutional:      Appearance: She is not diaphoretic.     Comments: Raspy voice  HENT:     Head: Normocephalic.     Right Ear: Tympanic membrane, ear canal and external ear normal.     Left Ear: Tympanic membrane, ear canal and external ear normal.      Nose: Nose normal. No mucosal edema or rhinorrhea.     Mouth/Throat:     Pharynx: Uvula midline. No oropharyngeal exudate.  Eyes:     Conjunctiva/sclera: Conjunctivae normal.  Neck:     Thyroid: No thyromegaly.     Trachea: Trachea normal. No tracheal tenderness or tracheal deviation.  Cardiovascular:     Rate and Rhythm: Normal rate and regular rhythm.     Heart sounds: Normal heart sounds, S1 normal and S2 normal. No murmur heard. Pulmonary:     Effort: No respiratory distress.     Breath sounds: Normal breath sounds. No stridor. No wheezing or rales.  Lymphadenopathy:     Head:     Right side of head:  No tonsillar adenopathy.     Left side of head: No tonsillar adenopathy.     Cervical: No cervical adenopathy.  Skin:    Findings: No erythema or rash.     Nails: There is no clubbing.  Neurological:     Mental Status: She is alert.     Diagnostics:    Spirometry was performed and demonstrated an FEV1 of 1.89 at 89 % of predicted.   Results of a chest x-ray obtained 23 Sep 2023 identified hyperexpanded lungs without any parenchymal abnormality, pleural effusion, pneumothorax.  Normal cardiac silhouette and mediastinum.  Assessment and Plan:   1. Not well controlled moderate persistent asthma   2. Perennial allergic rhinitis   3. LPRD (laryngopharyngeal reflux disease)    1. Continue to treat and prevent inflammation:   A. Flonase  1-2 sprays each nostril one time per day  B. Montelukast  10mg  one time per day  C. Tezepelumab  injection today and every 4 weeks  D. For now, DECREASE Symbicort  - 2 inhalations 1 time per day with spacer  2.  Continue to treat and prevent reflux/LPR:     A. Nexium  40 mg - 2 times per day + famotidine  40 mg - in evening  B. Minimize caffeine and chocolate consumption  3. If needed:   A. Claritin 10 mg - 1-2 times per day  B. Mucinex DM - 2 times per day  4. Evaluation of throat ASAP with ENT  5. Return to clinic in 1 month or earlier  if problem  6. Influenza = Tamiflu. Covid = Paxlovid  Mandolin will start tezepelumab  today.  Certainly the administration of Symbicort  has helped her respiratory tract symptoms but she is developing a significant side effect with the use of this medication.  A single isolated inhaled steroid did not result in good enough improvement regarding her asthma symptoms and I think we are obligated to have her use a combination inhaler with a higher dose of inhaled steroid but the results of that administration appear to be significant laryngitis.  Hopefully with some tezepelumab  will be able to eliminate the use of her inhalers at some point in the near future.  And I would like her throat to be evaluated to make sure were not dealing with anything else other than LPR and a inhaler side effect giving rise to her throat symptoms and we will see if we can get that arranged sometime this week.   Schuyler Custard, MD Allergy / Immunology [facility]

## 2023-10-07 NOTE — Patient Instructions (Addendum)
   1. Continue to treat and prevent inflammation:   A. Flonase  1-2 sprays each nostril one time per day  B. Montelukast  10mg  one time per day  C. Tezepelumab  injection today and every 4 weeks  D. For now, DECREASE Symbicort  - 2 inhalations 1 time per day with spacer  2.  Continue to treat and prevent reflux/LPR:     A. Nexium  40 mg - 2 times per day + famotidine  40 mg - in evening  B. Minimize caffeine and chocolate consumption  3. If needed:   A. Claritin 10 mg - 1-2 times per day  B. Mucinex DM - 2 times per day  4. Evaluation of throat ASAP with ENT  5. Return to clinic in 1 month or earlier if problem  6. Influenza = Tamiflu. Covid = Paxlovid

## 2023-10-08 ENCOUNTER — Encounter: Payer: Self-pay | Admitting: Allergy and Immunology

## 2023-10-15 ENCOUNTER — Telehealth: Payer: Self-pay | Admitting: *Deleted

## 2023-10-15 DIAGNOSIS — J4550 Severe persistent asthma, uncomplicated: Secondary | ICD-10-CM

## 2023-10-15 NOTE — Telephone Encounter (Signed)
-----   Message from [facility] Camilo Cella G sent at 10/07/2023  5:29 PM EDT ----- Regarding: Tezspire  [place] Dr. Kozlow started patient on Tezspire  today. Future injections need to be done at our [facility] location. Consent signed

## 2023-10-15 NOTE — Telephone Encounter (Signed)
 Patient Ins denied tezspire  has to try 3 of the preferred drugs for her plan Dupixent, Nucala, Fasenra or Xolair. She will also need a CBC w/diff or IgE to get on one of these drugs. Please advise

## 2023-10-16 ENCOUNTER — Ambulatory Visit
Admission: RE | Admit: 2023-10-16 | Discharge: 2023-10-16 | Disposition: A | Discharge status: Home / Self Care | Payer: Commercial Managed Care - PPO | Source: Ambulatory Visit | Attending: Family Medicine | Admitting: Family Medicine

## 2023-10-16 DIAGNOSIS — E2839 Other primary ovarian failure: Secondary | ICD-10-CM

## 2023-10-16 DIAGNOSIS — Z1231 Encounter for screening mammogram for malignant neoplasm of breast: Secondary | ICD-10-CM

## 2023-10-23 NOTE — Telephone Encounter (Signed)
 L/m for patient to contact me to advise need for labs to be done in order to determine if she qualifies for any other biologic therapy per her Ins requirement

## 2023-10-23 NOTE — Telephone Encounter (Signed)
 Spoke to patient and advised situation with Ins and Tezspire  and she will call then go by [place] clinic to get labs done

## 2023-10-23 NOTE — Addendum Note (Signed)
 Addended by: Evangelina Hilt on: 10/23/2023 11:56 AM   Modules accepted: Orders

## 2023-10-25 LAB — CBC WITH DIFFERENTIAL/PLATELET
Basophils Absolute: 0 10*3/uL (ref 0.0–0.2)
Basos: 0 %
EOS (ABSOLUTE): 0.1 10*3/uL (ref 0.0–0.4)
Eos: 1 %
Hematocrit: 42.5 % (ref 34.0–46.6)
Hemoglobin: 14 g/dL (ref 11.1–15.9)
Immature Grans (Abs): 0 10*3/uL (ref 0.0–0.1)
Immature Granulocytes: 0 %
Lymphocytes Absolute: 1.6 10*3/uL (ref 0.7–3.1)
Lymphs: 26 %
MCH: 30 pg (ref 26.6–33.0)
MCHC: 32.9 g/dL (ref 31.5–35.7)
MCV: 91 fL (ref 79–97)
Monocytes Absolute: 0.5 10*3/uL (ref 0.1–0.9)
Monocytes: 9 %
Neutrophils Absolute: 4 10*3/uL (ref 1.4–7.0)
Neutrophils: 64 %
Platelets: 308 10*3/uL (ref 150–450)
RBC: 4.66 x10E6/uL (ref 3.77–5.28)
RDW: 13.1 % (ref 11.7–15.4)
WBC: 6.3 10*3/uL (ref 3.4–10.8)

## 2023-11-02 LAB — IGE: IgE (Immunoglobulin E), Serum: 4 [IU]/mL — ABNORMAL LOW (ref 6–495)

## 2023-11-04 ENCOUNTER — Ambulatory Visit: Payer: Self-pay | Admitting: Allergy and Immunology

## 2023-11-12 ENCOUNTER — Other Ambulatory Visit: Payer: Self-pay

## 2023-11-12 ENCOUNTER — Encounter: Payer: Self-pay | Admitting: Allergy and Immunology

## 2023-11-12 ENCOUNTER — Ambulatory Visit (INDEPENDENT_AMBULATORY_CARE_PROVIDER_SITE_OTHER): Admitting: Allergy and Immunology

## 2023-11-12 VITALS — BP 116/78 | HR 82 | Temp 97.7°F | Wt 189.3 lb

## 2023-11-12 DIAGNOSIS — J4540 Moderate persistent asthma, uncomplicated: Secondary | ICD-10-CM | POA: Diagnosis not present

## 2023-11-12 DIAGNOSIS — J3089 Other allergic rhinitis: Secondary | ICD-10-CM | POA: Diagnosis not present

## 2023-11-12 DIAGNOSIS — K219 Gastro-esophageal reflux disease without esophagitis: Secondary | ICD-10-CM

## 2023-11-12 MED ORDER — FAMOTIDINE 40 MG PO TABS: 40.0000 mg | ORAL_TABLET | Freq: Every evening | ORAL | 1 refills | Status: DC

## 2023-11-12 MED ORDER — FLUTICASONE PROPIONATE 50 MCG/ACT NA SUSP: 1.0000 | Freq: Every day | NASAL | 1 refills | Status: DC

## 2023-11-12 MED ORDER — ESOMEPRAZOLE MAGNESIUM 40 MG PO CPDR: 40.0000 mg | DELAYED_RELEASE_CAPSULE | Freq: Two times a day (BID) | ORAL | 1 refills | Status: DC

## 2023-11-12 MED ORDER — FLUTICASONE PROPIONATE HFA 44 MCG/ACT IN AERO: 2.0000 | INHALATION_SPRAY | RESPIRATORY_TRACT | 0 refills | Status: DC | PRN

## 2023-11-12 MED ORDER — MONTELUKAST SODIUM 10 MG PO TABS: 10.0000 mg | ORAL_TABLET | Freq: Every evening | ORAL | 1 refills | Status: DC

## 2023-11-12 MED ORDER — ALBUTEROL SULFATE HFA 108 (90 BASE) MCG/ACT IN AERS: 2.0000 | INHALATION_SPRAY | RESPIRATORY_TRACT | 1 refills | Status: DC | PRN

## 2023-11-12 MED ORDER — LORATADINE 10 MG PO TABS: 10.0000 mg | ORAL_TABLET | Freq: Every day | ORAL | 1 refills | Status: DC | PRN

## 2023-11-12 NOTE — Progress Notes (Unsigned)
 [place] - [place] - [place] - [place] - [place]   Follow-up Note  Referring Provider: Teresa Channel, MD Primary Provider: Teresa Channel, MD Date of Office Visit: 11/12/2023  Subjective:   Brenda Daniel (DOB: [birth year]) is a 68 y.o. female who [facility]12/[facility]23 in re-evaluation of the following:  HPI: Jamesyn returns to this clinic in evaluation of asthma, allergic rhinitis, LPR.  I last saw in this clinic 07 Oct 2023.  We gave her an injection of tezepelumab  last visit and that has really resulted in significant improvement regarding her persistent cough and her shortness of breath.  She does not need to use any short acting bronchodilator.  She can perform yoga with very little problem.  She discontinued her Symbicort  and her hoarseness resolved.  It appears that even a few doses of Symbicort  gives rise to significant laryngitis.  Her reflux is doing very well on her current plan.  Allergies as of 11/12/2023       Reactions   Advil [ibuprofen] Other (See Comments)   Dizzy; any NSAIDS    Naproxen Other (See Comments)   Niacin And Related Other (See Comments)   dizzy   Red Yeast Rice [cholestin] Other (See Comments)   dizzy        Medication List    albuterol  (2.5 MG/3ML) 0.083% nebulizer solution Commonly known as: PROVENTIL  Take 3 mLs (2.5 mg total) by nebulization every 4 (four) hours as needed for wheezing or shortness of breath (coughing fits).   albuterol  108 (90 Base) MCG/ACT inhaler Commonly known as: VENTOLIN  HFA Can inhale two puffs every four to six hours as needed for cough, wheeze, shortness of breath.   benzonatate  200 MG capsule Commonly known as: TESSALON  Take 200 mg by mouth as needed.   Betamethasone Valerate 0.12 % foam Apply 1 Application topically daily as needed.   budesonide -formoterol  160-4.5 MCG/ACT inhaler Commonly known as: Symbicort  Use 2 puffs twice daily to prevent cough or  wheeze. Rinse mouth after use. Use with spacer.   Ciclopirox 1 % shampoo Apply topically 2 (two) times a week.   esomeprazole  40 MG capsule Commonly known as: NEXIUM  Take one capsule twice daily during flare-up as directed.   famotidine  40 MG tablet Commonly known as: PEPCID  Take 1 tablet (40 mg total) by mouth every morning.   fluticasone  50 MCG/ACT nasal spray Commonly known as: FLONASE  Place 1 spray into both nostrils daily.   gelatin 650 MG capsule Take 650 mg by mouth See admin instructions. Mon - Fri   ketoconazole 2 % shampoo Commonly known as: NIZORAL Apply 1 Application topically every 3 (three) days.   loratadine 10 MG tablet Commonly known as: CLARITIN Take 10 mg by mouth daily.   losartan 50 MG tablet Commonly known as: COZAAR Take 50 mg by mouth daily.   montelukast  10 MG tablet Commonly known as: SINGULAIR  Take 1 tablet (10 mg total) by mouth at bedtime.   Nebulizer Misc 1 Device by Does not apply route as directed.   Potassium 99 MG Tabs Take 1 tablet by mouth at bedtime.   pramoxine 1 % Lotn Commonly known as: SARNA SENSITIVE 1 application as needed Externally Three times a day   traZODone 50 MG tablet Commonly known as: DESYREL Take 25 mg by mouth at bedtime.   triamterene-hydrochlorothiazide 75-50 MG tablet Commonly known as: MAXZIDE Take 1 tablet by mouth daily.   valACYclovir 500 MG tablet Commonly known as: VALTREX Take  500 mg by mouth See admin instructions. Take one tablet (500 mg) by mouth daily at bedtime, take four tablets (2000 mg) once as one dose as needed at onset of fever blister   VITAMIN D PO Take 1 tablet by mouth See admin instructions. Take one tablet by mouth daily on Monday thru Friday (skip Saturday and Sunday)   Zoryve 0.3 % Crea Generic drug: Roflumilast 1 Application.    Past Medical History:  Diagnosis Date   Allergic rhinitis    per Dr. Allannah Kempen   Asthma    per Dr. Maurilio   DJD (degenerative joint  disease)    GERD (gastroesophageal reflux disease)    Hyperlipidemia    Hypertension     Past Surgical History:  Procedure Laterality Date   APPENDECTOMY     BRAVO PH STUDY N/A 03/16/2015   Procedure: BRAVO PH STUDY;  Surgeon: Jerrell Sol, MD;  Location: [facility];  Service: Endoscopy;  Laterality: N/A;   CESAREAN SECTION     ESOPHAGOGASTRODUODENOSCOPY N/A 03/16/2015   Procedure: ESOPHAGOGASTRODUODENOSCOPY (EGD);  Surgeon: Jerrell Sol, MD;  Location: [facility];  Service: Endoscopy;  Laterality: N/A;   IR ANGIO INTRA EXTRACRAN SEL COM CAROTID INNOMINATE BILAT MOD SED  04/24/2019   IR ANGIO VERTEBRAL SEL VERTEBRAL BILAT MOD SED  04/24/2019   IR US  GUIDE VASC ACCESS RIGHT  04/24/2019   MYOMECTOMY     RETINAL LASER PROCEDURE  04/2021   TEMPOROMANDIBULAR JOINT SURGERY Right    TONSILLECTOMY      Review of systems negative except as noted in HPI / PMHx or noted below:  Review of Systems  Constitutional: Negative.   HENT: Negative.    Eyes: Negative.   Respiratory: Negative.    Cardiovascular: Negative.   Gastrointestinal: Negative.   Genitourinary: Negative.   Musculoskeletal: Negative.   Skin: Negative.   Neurological: Negative.   Endo/Heme/Allergies: Negative.   Psychiatric/Behavioral: Negative.       Objective:   Vitals:   11/12/23 1548  BP: 116/78  Pulse: 82  Temp: 97.7 F (36.5 C)  SpO2: 96%      Weight: 189 lb 4.8 oz (85.9 kg)   Physical Exam Constitutional:      Appearance: She is not diaphoretic.  HENT:     Head: Normocephalic.     Right Ear: Tympanic membrane, ear canal and external ear normal.     Left Ear: Tympanic membrane, ear canal and external ear normal.     Nose: Nose normal. No mucosal edema or rhinorrhea.     Mouth/Throat:     Pharynx: Uvula midline. No oropharyngeal exudate.   Eyes:     Conjunctiva/sclera: Conjunctivae normal.   Neck:     Thyroid: No thyromegaly.     Trachea: Trachea normal. No tracheal tenderness or  tracheal deviation.   Cardiovascular:     Rate and Rhythm: Normal rate and regular rhythm.     Heart sounds: Normal heart sounds, S1 normal and S2 normal. No murmur heard. Pulmonary:     Effort: No respiratory distress.     Breath sounds: Normal breath sounds. No stridor. No wheezing or rales.  Lymphadenopathy:     Head:     Right side of head: No tonsillar adenopathy.     Left side of head: No tonsillar adenopathy.     Cervical: No cervical adenopathy.   Skin:    Findings: No erythema or rash.     Nails: There is no clubbing.   Neurological:  Mental Status: She is alert.     Diagnostics: Spirometry was performed and demonstrated an FEV1 of 1.96 at 93 % of predicted.  Assessment and Plan:   1. Not well controlled moderate persistent asthma   2. Perennial allergic rhinitis   3. LPRD (laryngopharyngeal reflux disease)    1. Continue to treat and prevent inflammation:   A. Flonase  1-2 sprays each nostril one time per day  B. Montelukast  10mg  one time per day  C. SAMPLE Tezepelumab  injection today (will discuss with Tammy)  2.  Continue to treat and prevent reflux/LPR:     A. Nexium  40 mg - 2 times per day + famotidine  40 mg - in evening  B. Minimize caffeine and chocolate consumption  3. If needed:   A. Claritin 10 mg - 1-2 times per day  B. Mucinex DM - 2 times per day  C. Albuterol  + Fluticasone  44 - 2 inhalations every 4-6 hours  4. Return to clinic in 12 weeks or earlier if problem  5. Influenza = Tamiflu. Covid = Paxlovid  Ethyl has had an excellent response to the administration of tezepelumab  and we will give her another sample today.  Unfortunately, [place] [employer] denied her tezepelumab  and we will once again reapply for this agent.  She does not qualify for an anti-IL-4/13, anti-IL-5, anti-IgE monoclonal antibody as she has no eosinophilia and her IgE level is less than 10 KU/L.  She is intolerant of utilizing inhaled controller agents secondary to  her drug-induced laryngitis.  Hopefully we can keep her under good control using tezepelumab .  Camellia Denis, MD Allergy / Immunology  [facility]

## 2023-11-12 NOTE — Patient Instructions (Addendum)
   1. Continue to treat and prevent inflammation:   A. Flonase  1-2 sprays each nostril one time per day  B. Montelukast  10mg  one time per day  C. SAMPLE Tezepelumab  injection today (will discuss with Tammy)  2.  Continue to treat and prevent reflux/LPR:     A. Nexium  40 mg - 2 times per day + famotidine  40 mg - in evening  B. Minimize caffeine and chocolate consumption  3. If needed:   A. Claritin 10 mg - 1-2 times per day  B. Mucinex DM - 2 times per day  C. Albuterol  + Fluticasone  44 - 2 inhalations every 4-6 hours  4. Return to clinic in 12 weeks or earlier if problem  5. Influenza = Tamiflu. Covid = Paxlovid

## 2023-11-13 ENCOUNTER — Encounter: Payer: Self-pay | Admitting: Allergy and Immunology

## 2023-11-13 ENCOUNTER — Ambulatory Visit (INDEPENDENT_AMBULATORY_CARE_PROVIDER_SITE_OTHER)

## 2023-11-13 ENCOUNTER — Telehealth: Payer: Self-pay | Admitting: *Deleted

## 2023-11-13 DIAGNOSIS — J4540 Moderate persistent asthma, uncomplicated: Secondary | ICD-10-CM

## 2023-11-13 NOTE — Telephone Encounter (Signed)
 Spoke to patient and advised to come in for sample today. She will bring note given representation so I can appeal her Tezspire  due to denial for same

## 2023-11-13 NOTE — Telephone Encounter (Signed)
-----   Message from ERIC J KOZLOW sent at 11/12/2023  4:37 PM EDT ----- Need sample of teze (out of town end of this week). No EOS, IgE < 10. Does not qualify for anti-Il4/13 or anti-IL-5 or anti-IgE

## 2023-11-15 ENCOUNTER — Encounter (HOSPITAL_COMMUNITY): Payer: Self-pay | Admitting: Interventional Radiology

## 2023-11-27 ENCOUNTER — Ambulatory Visit

## 2023-12-16 ENCOUNTER — Ambulatory Visit (INDEPENDENT_AMBULATORY_CARE_PROVIDER_SITE_OTHER)

## 2023-12-16 DIAGNOSIS — J4540 Moderate persistent asthma, uncomplicated: Secondary | ICD-10-CM | POA: Diagnosis not present

## 2023-12-23 ENCOUNTER — Institutional Professional Consult (permissible substitution) (INDEPENDENT_AMBULATORY_CARE_PROVIDER_SITE_OTHER): Admitting: Otolaryngology

## 2023-12-24 ENCOUNTER — Ambulatory Visit (INDEPENDENT_AMBULATORY_CARE_PROVIDER_SITE_OTHER): Payer: Medicare Other | Admitting: Allergy and Immunology

## 2023-12-24 ENCOUNTER — Telehealth: Payer: Self-pay | Admitting: Internal Medicine

## 2023-12-24 VITALS — BP 102/70 | HR 86 | Temp 97.7°F | Resp 14 | Ht 62.0 in | Wt 187.2 lb

## 2023-12-24 DIAGNOSIS — J4550 Severe persistent asthma, uncomplicated: Secondary | ICD-10-CM

## 2023-12-24 DIAGNOSIS — J3089 Other allergic rhinitis: Secondary | ICD-10-CM

## 2023-12-24 DIAGNOSIS — R0609 Other forms of dyspnea: Secondary | ICD-10-CM

## 2023-12-24 DIAGNOSIS — K219 Gastro-esophageal reflux disease without esophagitis: Secondary | ICD-10-CM | POA: Diagnosis not present

## 2023-12-24 MED ORDER — ESOMEPRAZOLE MAGNESIUM 40 MG PO CPDR: 40.0000 mg | DELAYED_RELEASE_CAPSULE | Freq: Two times a day (BID) | ORAL | 1 refills | Status: DC

## 2023-12-24 MED ORDER — ALBUTEROL SULFATE HFA 108 (90 BASE) MCG/ACT IN AERS: 2.0000 | INHALATION_SPRAY | RESPIRATORY_TRACT | 1 refills | Status: DC | PRN

## 2023-12-24 MED ORDER — FLUTICASONE PROPIONATE 50 MCG/ACT NA SUSP: 1.0000 | Freq: Every day | NASAL | 1 refills | Status: DC

## 2023-12-24 MED ORDER — FLUTICASONE PROPIONATE HFA 44 MCG/ACT IN AERO: 2.0000 | INHALATION_SPRAY | Freq: Two times a day (BID) | RESPIRATORY_TRACT | 5 refills | Status: DC

## 2023-12-24 MED ORDER — ALBUTEROL SULFATE (2.5 MG/3ML) 0.083% IN NEBU
2.5000 mg | INHALATION_SOLUTION | RESPIRATORY_TRACT | 1 refills | Status: AC | PRN
Start: 2023-12-24 — End: ?

## 2023-12-24 MED ORDER — MONTELUKAST SODIUM 10 MG PO TABS: 10.0000 mg | ORAL_TABLET | Freq: Every evening | ORAL | 1 refills | Status: DC

## 2023-12-24 MED ORDER — BECLOMETHASONE DIPROP HFA 40 MCG/ACT IN AERB: 2.0000 | INHALATION_SPRAY | Freq: Every morning | RESPIRATORY_TRACT | 1 refills | Status: DC

## 2023-12-24 NOTE — Progress Notes (Unsigned)
 [place] - [place] - [place] - [place] - [place]   Follow-up Note  Referring Provider: Teresa Channel, MD Primary Provider: Teresa Channel, MD Date of Office Visit: 12/24/2023  Subjective:   Brenda Daniel (DOB: August 04, 1955) is a 68 y.o. female who [facility] in re-evaluation of the following:  HPI: Brenda Daniel presents to this clinic in evaluation of asthma, allergic rhinitis, LPR.  I last saw her in this clinic 12 November 2023.  She is using tezepelumab  along with montelukast  to treat her asthma and she has not been having any coughing and she does not have any shortness of breath if she is resting.  However, if she exerts herself she gets very short of breath.  She cannot really walk any distance without getting short of breath.  She can perform yoga but this is a very controlled motion and she self regulates how much she can perform secondary to the shortness of breath.  Several years ago she had evaluation with cardiology and everything checked out okay.  This spring she had a chest x-ray which checked out okay.  Her reflux is under very good control at this point in time on her current plan.  She is using her proton pump inhibitor just once a day along with famotidine  at night.  She is limited in her ability to use inhaled steroids as she develops very significant laryngeal myopathy and hoarseness with most of the inhaled steroids but especially with Symbicort .  When she does not use inhaled steroid she does not have any hoarseness.  Allergies as of 12/24/2023       Reactions   Advil [ibuprofen] Other (See Comments)   Dizzy; any NSAIDS    Naproxen Other (See Comments)   Niacin And Related Other (See Comments)   dizzy   Red Yeast Rice [cholestin] Other (See Comments)   dizzy        Medication List    albuterol  (2.5 MG/3ML) 0.083% nebulizer solution Commonly known as: PROVENTIL  Take 3 mLs (2.5 mg total) by nebulization  every 4 (four) hours as needed for wheezing or shortness of breath (coughing fits).   albuterol  108 (90 Base) MCG/ACT inhaler Commonly known as: VENTOLIN  HFA Inhale 2 puffs into the lungs every 4 (four) hours as needed for wheezing or shortness of breath. Take with Flovent  44 during flare ups.   Betamethasone Valerate 0.12 % foam Apply 1 Application topically daily as needed.   Ciclopirox 1 % shampoo Apply topically 2 (two) times a week.   esomeprazole  40 MG capsule Commonly known as: NEXIUM  Take 1 capsule (40 mg total) by mouth 2 (two) times daily before a meal. Take one capsule twice daily during flare-up as directed.   famotidine  40 MG tablet Commonly known as: PEPCID  Take 1 tablet (40 mg total) by mouth at bedtime.   fluticasone  50 MCG/ACT nasal spray Commonly known as: FLONASE  Place 1 spray into both nostrils daily.   gelatin 650 MG capsule Take 650 mg by mouth See admin instructions. Mon - Fri   loratadine  10 MG tablet Commonly known as: CLARITIN  Take 1 tablet (10 mg total) by mouth daily as needed for allergies (Can take an extra dose during flare ups.).   losartan 50 MG tablet Commonly known as: COZAAR Take 50 mg by mouth daily.   montelukast  10 MG tablet Commonly known as: SINGULAIR  Take 1 tablet (10 mg total) by mouth at bedtime.   Nebulizer Misc 1 Device by  Does not apply route as directed.   Potassium 99 MG Tabs Take 1 tablet by mouth at bedtime.   Tezspire  210 MG/1. Soaj Generic drug: Tezepelumab -ekko Inject 210 mg into the skin every 28 (twenty-eight) days.   traZODone 50 MG tablet Commonly known as: DESYREL Take 25 mg by mouth at bedtime.   valACYclovir 500 MG tablet Commonly known as: VALTREX Take 500 mg by mouth See admin instructions. Take one tablet (500 mg) by mouth daily at bedtime, take four tablets (2000 mg) once as one dose as needed at onset of fever blister   VITAMIN D PO Take 1 tablet by mouth See admin instructions. Take one  tablet by mouth daily on Monday thru Friday (skip Saturday and Sunday)    Past Medical History:  Diagnosis Date   Allergic rhinitis    per Dr. Maurilio   Asthma    per Dr. Maurilio   DJD (degenerative joint disease)    GERD (gastroesophageal reflux disease)    Hyperlipidemia    Hypertension     Past Surgical History:  Procedure Laterality Date   APPENDECTOMY     BRAVO PH STUDY N/A 03/16/2015   Procedure: BRAVO PH STUDY;  Surgeon: Jerrell Sol, MD;  Location: [facility];  Service: Endoscopy;  Laterality: N/A;   CESAREAN SECTION     ESOPHAGOGASTRODUODENOSCOPY N/A 03/16/2015   Procedure: ESOPHAGOGASTRODUODENOSCOPY (EGD);  Surgeon: Jerrell Sol, MD;  Location: [facility];  Service: Endoscopy;  Laterality: N/A;   IR ANGIO INTRA EXTRACRAN SEL COM CAROTID INNOMINATE BILAT MOD SED  04/24/2019   IR ANGIO VERTEBRAL SEL VERTEBRAL BILAT MOD SED  04/24/2019   IR US  GUIDE VASC ACCESS RIGHT  04/24/2019   MYOMECTOMY     RETINAL LASER PROCEDURE  04/2021   TEMPOROMANDIBULAR JOINT SURGERY Right    TONSILLECTOMY      Review of systems negative except as noted in HPI / PMHx or noted below:  Review of Systems  Constitutional: Negative.   HENT: Negative.    Eyes: Negative.   Respiratory: Negative.    Cardiovascular: Negative.   Gastrointestinal: Negative.   Genitourinary: Negative.   Musculoskeletal: Negative.   Skin: Negative.   Neurological: Negative.   Endo/Heme/Allergies: Negative.   Psychiatric/Behavioral: Negative.       Objective:   Vitals:   12/24/23 0859  BP: 102/70  Pulse: 86  Resp: 14  Temp: 97.7 F (36.5 C)  SpO2: 97%   Height: 5' 2 (157.5 cm)  Weight: 187 lb 3.2 oz (84.9 kg)   Physical Exam Constitutional:      Appearance: She is not diaphoretic.  HENT:     Head: Normocephalic.     Right Ear: Tympanic membrane, ear canal and external ear normal.     Left Ear: Tympanic membrane, ear canal and external ear normal.     Nose: Nose normal. No mucosal  edema or rhinorrhea.     Mouth/Throat:     Pharynx: Uvula midline. No oropharyngeal exudate.  Eyes:     Conjunctiva/sclera: Conjunctivae normal.  Neck:     Thyroid: No thyromegaly.     Trachea: Trachea normal. No tracheal tenderness or tracheal deviation.  Cardiovascular:     Rate and Rhythm: Normal rate and regular rhythm.     Heart sounds: Normal heart sounds, S1 normal and S2 normal. No murmur heard. Pulmonary:     Effort: No respiratory distress.     Breath sounds: Normal breath sounds. No stridor. No wheezing or rales.  Lymphadenopathy:  Head:     Right side of head: No tonsillar adenopathy.     Left side of head: No tonsillar adenopathy.     Cervical: No cervical adenopathy.  Skin:    Findings: No erythema or rash.     Nails: There is no clubbing.  Neurological:     Mental Status: She is alert.     Diagnostics: Spirometry was performed and demonstrated an FEV1 of 1.97 at 91 % of predicted.  Oxygen saturation at rest on room air was 96%.  Oxygen saturation while walking the hallway on room air was 96%.  Results of a chest x-ray obtained 25 Sep 2023 identified the following:  Cardiac silhouette is normal in size and configuration. No mediastinal or hilar masses. No evidence of adenopathy.  Lungs are hyperexpanded, but clear. No pleural effusion or pneumothorax.   Assessment and Plan:   1. Asthma, severe persistent, well-controlled   2. Perennial allergic rhinitis   3. LPRD (laryngopharyngeal reflux disease)   4. DOE (dyspnea on exertion)    1. Continue to treat and prevent inflammation:   A. Flonase  1-2 sprays each nostril one time per day  B. Montelukast  10mg  one time per day  C. Tezepelumab  injections every 4 weeks  D. START QVAR  40 - 2 inhalations 1 time per day (empty lungs)  2.  Continue to treat and prevent reflux/LPR:     A. Nexium  40 mg - 1-2 times per day + famotidine  40 mg - in evening  B. Minimize caffeine and chocolate consumption  3. If  needed:   A. Claritin  10 mg - 1-2 times per day  B. Mucinex DM - 2 times per day  C. Albuterol  + Fluticasone  44 - 2 inhalations every 4-6 hours  4. Evaluation for dyspnea on exertion:   A. Obtain stress test with cardiology  B. Obtain ECHO with cardiology  5. Return to clinic in 12 weeks or earlier if problem  6. Influenza = Tamiflu. Covid = Paxlovid  Brenda Daniel has dyspnea on exertion that is out of context given her control of asthma, her spirometry results, her lack of deoxygenation while exerting herself, and a physical exam that does not demonstrate any evidence of heart failure.  I think the next step in her evaluation of dyspnea on exertion is to regroup with cardiology to have a stress test as well as an echocardiogram to look for limited perfusion of her coronary arteries and possible clinically significant diastolic dysfunction.  I have started her on an inhaled steroid to possibly address any inflammation that may be present in her lower airway.  She is intolerant of using most inhalers secondary to laryngeal myopathy but hopefully with this low-dose will be able to get some anti-inflammatory effect without triggering of her myopathy.  I will see her back in this clinic in 12 weeks.   Camellia Denis, MD Allergy / Immunology [facility]

## 2023-12-24 NOTE — Patient Instructions (Addendum)
   1. Continue to treat and prevent inflammation:   A. Flonase  1-2 sprays each nostril one time per day  B. Montelukast  10mg  one time per day  C. Tezepelumab  injections every 4 weeks  D. START QVAR  40 - 2 inhalations 1 time per day (empty lungs)  2.  Continue to treat and prevent reflux/LPR:     A. Nexium  40 mg - 1-2 times per day + famotidine  40 mg - in evening  B. Minimize caffeine and chocolate consumption  3. If needed:   A. Claritin  10 mg - 1-2 times per day  B. Mucinex DM - 2 times per day  C. Albuterol  + Fluticasone  44 - 2 inhalations every 4-6 hours  4. Evaluation for dyspnea on exertion:   A. Obtain stress test with cardiology  B. Obtain ECHO with cardiology  5. Return to clinic in 12 weeks or earlier if problem  6. Influenza = Tamiflu. Covid = Paxlovid

## 2023-12-24 NOTE — Telephone Encounter (Signed)
 Pt c/o Shortness Of Breath: STAT if SOB developed within the last 24 hours or pt is noticeably SOB on the phone  1. Are you currently SOB (can you hear that pt is SOB on the phone)?   SOB on exertion which goes away when she sits down  2. How long have you been experiencing SOB?   Around January after having flu and has got worse  3. Are you SOB when sitting or when up moving around?   Up and moving around  4. Are you currently experiencing any other symptoms?   No   Patient stated she works and the best time to reach her is between 12:30 pm and 2:00 pm or after 5:00 pm.

## 2023-12-24 NOTE — Telephone Encounter (Signed)
 Spoke to patient she stated she is sob with exertion.She saw asthma Dr.this morning and he advised her to see Cardiologist.Stated she scheduled appointment with Dr.Acharya in Oct.She would like to be seen sooner.Appointment scheduled with Rosaline Bane NP 8/11 at 10:05 am at [facility].I will make Dr.Acharya aware.

## 2023-12-25 ENCOUNTER — Encounter: Payer: Self-pay | Admitting: Allergy and Immunology

## 2023-12-30 ENCOUNTER — Encounter (HOSPITAL_BASED_OUTPATIENT_CLINIC_OR_DEPARTMENT_OTHER): Payer: Self-pay | Admitting: Nurse Practitioner

## 2023-12-30 ENCOUNTER — Ambulatory Visit (HOSPITAL_BASED_OUTPATIENT_CLINIC_OR_DEPARTMENT_OTHER): Admitting: Nurse Practitioner

## 2023-12-30 VITALS — BP 120/80 | HR 75 | Ht 62.0 in | Wt 184.0 lb

## 2023-12-30 DIAGNOSIS — I10 Essential (primary) hypertension: Secondary | ICD-10-CM | POA: Diagnosis not present

## 2023-12-30 DIAGNOSIS — R002 Palpitations: Secondary | ICD-10-CM

## 2023-12-30 DIAGNOSIS — R600 Localized edema: Secondary | ICD-10-CM

## 2023-12-30 DIAGNOSIS — Z6833 Body mass index (BMI) 33.0-33.9, adult: Secondary | ICD-10-CM

## 2023-12-30 DIAGNOSIS — R0609 Other forms of dyspnea: Secondary | ICD-10-CM | POA: Diagnosis not present

## 2023-12-30 DIAGNOSIS — E785 Hyperlipidemia, unspecified: Secondary | ICD-10-CM | POA: Diagnosis not present

## 2023-12-30 DIAGNOSIS — E66811 Obesity, class 1: Secondary | ICD-10-CM

## 2023-12-30 DIAGNOSIS — Z7189 Other specified counseling: Secondary | ICD-10-CM

## 2023-12-30 NOTE — Progress Notes (Addendum)
 Cardiology Office Note   Date:  12/30/2023  ID:  Brenda Daniel, DOB [birth year], MRN [id number] PCP: Brenda Channel, MD  [facility] Providers Cardiologist:  Brenda DELENA Merck, MD     PMH Hypertension Asthma GERD Hyperlipidemia Prediabetes  She established with Brenda Daniel 03/2018 for evaluation of chest tightness.  She works as a Education administrator.  She noted chest tightness that lasted seconds to minutes with no clear-cut exacerbating factors.  She was exercising at the time doing yoga and breathing activities.  She did experience chest tightness during yoga.  History of asthma for which she uses an inhaler.  Coronary CTA 05/08/2018 revealed calcium score of 0, minimal CAD in proximal LAD.  Last cardiology clinic visit was 03/21/2019 for with Brenda Daniel.  She reported more shortness of breath and chest pressure felt to be exacerbated by summer heat and allergies.  She reported persistent cough which is her main asthma symptom.  She was managing this with Pulmicort .  Hypertension managed with losartan and triamterene/hydrochlorothiazide which she reported is effective.  Her sister had recently suffered a mini stroke causing her increased concern about her own cardiovascular health.  LDL was 156.  She was noted to have retinal vascular occlusion by her ophthalmologist.  Coronary CTA 04/05/2023 coronary calcium score of 0, no evidence of CAD.  History of Present Illness  Discussed the use of AI scribe software for clinical note transcription with the patient, who gave verbal consent to proceed.  History of Present Illness Brenda Daniel is a very pleasant 68 year old female who is here today for evaluation of worsening shortness of breath.  She had the flu in January 2025 followed by her typical she reports history of allergy-induced asthma symptoms in the spring, however has been concerned about persistent worsening of breath with exertion.  Who presents with worsening shortness of  breath on exertion. Seen by her allergy doctor, Brenda Daniel, who adjusted medications and felt like her asthma was well-controlled.  He advised she follow-up with cardiology.  She noted progressive shortness of breath on exertion, impacting daily activities such as carrying a laundry basket upstairs and walking on flat ground at high elevation. She also experiences breathlessness when carrying her granddaughter and during yard work and yoga. She denies orthopnea or PND. Notes occasional LE edema. She occasionally feels palpitations, particularly with albuterol  use, and experiences lightheadedness without specific triggers, although this is rare. No chest pain. BP is consistently 120/80 mmHg or lower. She monitors her salt intake due to a family history of heart disease and maintains a relatively healthy diet, though she admits to overeating at times. Her physical activity includes walking and yoga and generally likes to stay active.    ROS: See HPI  Studies Reviewed EKG Interpretation Date/Time:  Monday December 30 2023 10:17:55 EDT Ventricular Rate:  75 PR Interval:  168 QRS Duration:  88 QT Interval:  390 QTC Calculation: 435 R Axis:   -26  Text Interpretation: Normal sinus rhythm Low voltage QRS Nonspecific T wave abnormality When compared with ECG of 21-Mar-2023 08:07, QRS axis Shifted left Confirmed by Brenda Daniel [id number]) on 12/30/2023 10:28:15 AM     No results found for: LIPOA  Risk Assessment/Calculations           Physical Exam VS:  BP 120/80   Pulse 75   Ht 5' 2 (1.575 m)   Wt 184 lb (83.5 kg)   SpO2 99%   BMI 33.65 kg/m    Wt  Readings from Last 3 Encounters:  12/30/23 184 lb (83.5 kg)  12/24/23 187 lb 3.2 oz (84.9 kg)  11/12/23 189 lb 4.8 oz (85.9 kg)    GEN: Well nourished, well developed in no acute distress NECK: No JVD; No carotid bruits CARDIAC: RRR, no murmurs, rubs, gallops RESPIRATORY:  Clear to auscultation without rales, wheezing or rhonchi  ABDOMEN:  Soft, non-tender, non-distended EXTREMITIES:  No edema; No deformity    Assessment & Plan DOE Mild LE edema Worsening exertional dyspnea over the past few months. Occasional LE edema but no chest pain, orthopnea, or PND.  Coronary CTA 04/05/2023 with no evidence of CAD.  Weight is stable.  No obvious evidence of fluid overload on exam. -Echo to evaluate heart and valve function -Will check BNP and BMET today for guidance of potential diuretic therapy -Consider diastolic bike stress test with Dr. Loni if diastolic dysfunction is noted on echo -Continue regular follow-up with asthma and allergy provider - Continue low-sodium diet  Hypertension BP is well controlled.  -Continue losartan, Maxide  Palpitations   Intermittent palpitations, worsened by albuterol .  No significant palpitations with regular activity. - We will continue to monitor clinically for now  Overweight   Cardiac risk Nonspecific ST abnormality on EKG She reports struggling with her weight for some time. Reports diet is overall healthy but occasional overeating. Previously lost weight using the [employer] app but has regained it. Coronary CTA 03/2023 with no CAD. She has DOE but no chest pain. We are getting echo for evaluation of heart and valve function. We reviewed that she has a nonspecific T wave abnormality on EKG that is consistent with previous tracing.  No acute concerns on EKG today. -Consider My Fitness Pal app for dietary tracking and weight management -Continue healthy, mostly whole food diet limiting processed food, saturated fat, sugar, and simple carbohydrates - Aim for at least 150 minutes of moderate intensity exercise each week in addition to leading a physically active lifestyle         Dispo: Keep your October appointment with Dr. Loni  Signed, Brenda Bane, NP-C

## 2023-12-30 NOTE — Patient Instructions (Addendum)
 Medication Instructions:  Your physician recommends that you continue on your current medications as directed. Please refer to the Current Medication list given to you today.  *If you need a refill on your cardiac medications before your next appointment, please call your pharmacy*  Lab Work: TODAY!!! BNP/BMET If you have labs (blood work) drawn today and your tests are completely normal, you will receive your results only by: MyChart Message (if you have MyChart) OR A paper copy in the mail If you have any lab test that is abnormal or we need to change your treatment, we will call you to review the results.  Testing/Procedures: Your physician has requested that you have an echocardiogram. Echocardiography is a painless test that uses sound waves to create images of your heart. It provides your doctor with information about the size and shape of your heart and how well your heart's chambers and valves are working. This procedure takes approximately one hour. There are no restrictions for this procedure. Please do NOT wear cologne, perfume or lotions (deodorant is allowed). Please arrive 15 minutes prior to your appointment time.  Please note: We ask at that you not bring children with you during ultrasound (echo/ vascular) testing. Due to room size and safety concerns, children are not allowed in the ultrasound rooms during exams. Our front office staff cannot provide observation of children in our lobby area while testing is being conducted. An adult accompanying a patient to their appointment will only be allowed in the ultrasound room at the discretion of the ultrasound technician under special circumstances. We apologize for any inconvenience.   Follow-Up: At [facility], you and your health needs are our priority.  As part of our continuing mission to provide you with exceptional heart care, our providers are all part of one team.  This team includes your primary Cardiologist  (physician) and Advanced Practice Providers or APPs (Physician Assistants and Nurse Practitioners) who all work together to provide you with the care you need, when you need it.  Your next appointment:   2 month(s) - Keep your October appointment  Provider:   Gayatri A Acharya, MD    We recommend signing up for the patient portal called MyChart.  Sign up information is provided on this After Visit Summary.  MyChart is used to connect with patients for Virtual Visits (Telemedicine).  Patients are able to view lab/test results, encounter notes, upcoming appointments, etc.  Non-urgent messages can be sent to your provider as well.   To learn more about what you can do with MyChart, go to [email or web address].   Other Instructions

## 2024-01-01 ENCOUNTER — Ambulatory Visit: Payer: Self-pay | Admitting: Nurse Practitioner

## 2024-01-01 LAB — BASIC METABOLIC PANEL WITH GFR
BUN/Creatinine Ratio: 15 (ref 12–28)
BUN: 15 mg/dL (ref 8–27)
CO2: 22 mmol/L (ref 20–29)
Calcium: 10 mg/dL (ref 8.7–10.3)
Chloride: 98 mmol/L (ref 96–106)
Creatinine, Ser: 1.01 mg/dL — ABNORMAL HIGH (ref 0.57–1.00)
Glucose: 93 mg/dL (ref 70–99)
Potassium: 3.9 mmol/L (ref 3.5–5.2)
Sodium: 139 mmol/L (ref 134–144)
eGFR: 61 mL/min/1.73 (ref >59)

## 2024-01-01 LAB — BRAIN NATRIURETIC PEPTIDE: BNP: 29.4 pg/mL (ref 0.0–100.0)

## 2024-01-13 ENCOUNTER — Ambulatory Visit (INDEPENDENT_AMBULATORY_CARE_PROVIDER_SITE_OTHER)

## 2024-01-13 DIAGNOSIS — J4550 Severe persistent asthma, uncomplicated: Secondary | ICD-10-CM | POA: Diagnosis not present

## 2024-01-30 ENCOUNTER — Ambulatory Visit (INDEPENDENT_AMBULATORY_CARE_PROVIDER_SITE_OTHER)

## 2024-01-30 DIAGNOSIS — R0609 Other forms of dyspnea: Secondary | ICD-10-CM | POA: Diagnosis not present

## 2024-01-30 DIAGNOSIS — Z7189 Other specified counseling: Secondary | ICD-10-CM

## 2024-01-30 DIAGNOSIS — I10 Essential (primary) hypertension: Secondary | ICD-10-CM

## 2024-01-30 LAB — ECHOCARDIOGRAM COMPLETE
Area-P 1/2: 4.39 cm2
Est EF: 60
S' Lateral: 1.94 cm

## 2024-01-31 ENCOUNTER — Encounter: Payer: Self-pay | Admitting: Allergy and Immunology

## 2024-01-31 NOTE — Telephone Encounter (Signed)
 I called the patient to schedule ov. I left a message to call the office back. Mychart message sent.

## 2024-01-31 NOTE — Telephone Encounter (Signed)
 The patient called the office and I scheduled appt.

## 2024-02-02 NOTE — Patient Instructions (Addendum)
   1. Continue to treat and prevent inflammation:   A. Flonase  1-2 sprays each nostril one time per day  B. Montelukast  10mg  one time per day  C. Tezepelumab  injections every 4 weeks  Stop Qvar  40   D. START [place]  80 - 2 inhalations 1 time per day (empty lungs) with spacer rinse mouth out after.  Let me know if you are not able to get this medication  2.  Continue to treat and prevent reflux/LPR:     A. Nexium  40 mg - 1-2 times per day + famotidine  40 mg - in evening  B. Minimize caffeine and chocolate consumption  3. If needed:   A. Claritin  10 mg - 1-2 times per day  B. Mucinex DM - 2 times per day  C. Albuterol  + Fluticasone  44 - 2 inhalations every 4-6 hours  4. Evaluation for dyspnea on exertion:   A. Obtain stress test with cardiology  B. Continue to follow up with cardiology  5. Return to clinic in 4 weeks with Dr. Kozlow or earlier if problem  6.  Recommend scheduling an appointment with ENT to discuss hoarseness/raspy voice.  Let us  know if you would need a referral and we will be glad to help  7.  Start prednisone  10 mg taking 2 tablets twice a day for 3 days, then on the fourth day take 2 tablets in the morning, and on the fifth day take 1 tablet and stop  Influenza = Tamiflu. Covid = Paxlovid

## 2024-02-03 ENCOUNTER — Other Ambulatory Visit: Payer: Self-pay

## 2024-02-03 ENCOUNTER — Ambulatory Visit (INDEPENDENT_AMBULATORY_CARE_PROVIDER_SITE_OTHER): Admitting: Family

## 2024-02-03 ENCOUNTER — Encounter: Payer: Self-pay | Admitting: Family

## 2024-02-03 VITALS — BP 100/70 | HR 81 | Temp 97.8°F

## 2024-02-03 DIAGNOSIS — J4551 Severe persistent asthma with (acute) exacerbation: Secondary | ICD-10-CM

## 2024-02-03 DIAGNOSIS — J3089 Other allergic rhinitis: Secondary | ICD-10-CM

## 2024-02-03 DIAGNOSIS — R0609 Other forms of dyspnea: Secondary | ICD-10-CM

## 2024-02-03 DIAGNOSIS — K219 Gastro-esophageal reflux disease without esophagitis: Secondary | ICD-10-CM

## 2024-02-03 DIAGNOSIS — R059 Cough, unspecified: Secondary | ICD-10-CM

## 2024-02-03 MED ORDER — ALVESCO 80 MCG/ACT IN AERS: INHALATION_SPRAY | RESPIRATORY_TRACT | 5 refills | Status: AC

## 2024-02-03 MED ORDER — PREDNISONE 10 MG PO TABS: ORAL_TABLET | ORAL | 0 refills | Status: AC

## 2024-02-03 NOTE — Progress Notes (Signed)
 [address] [place] [address] Dept: [phone]  FOLLOW UP NOTE  Patient ID: Brenda Daniel, female    DOB: 04/13/1956  Age: 68 y.o. MRN: [id number] Date of Office Visit: 02/03/2024  Assessment  Chief Complaint: Acute Visit, Cough (X 5 week ), and Wheezing  HPI Brenda Daniel is a 68 year old female who presents today for acute visit of cough and wheeze.  She was last seen on December 24, 2023 by Dr. Kozlow for well-controlled severe persistent asthma, perennial allergic rhinitis, laryngopharyngeal reflux disease, and dyspnea on exertion.  She denies any new diagnosis or surgery since her last office visit.  Severe persistent asthma: She reports that a week before going to urgent care on August 16 she began having coughing and the doctor there at urgent care heard wheezing.  She was given a 5-day course of steroids.  She reports that while she was on the prednisone  it helped, but the symptoms did not go away.  Her symptoms have been worse for the past couple weeks.  She reports hey dry cough, wheezing, and tightness in her chest couple times.  She describes the tightness as feeling like a great Dane is sitting on her chest rather than an elephant.  She reports that when she has said this before she has been told to go to the emergency room.  She reports that she has been to the emergency room 3 times for this and is never been her heart causing the symptoms.  She denies any nocturnal awakenings due to breathing problems.  She reports that the cough is worse after talking.  She will then also hear wheezing during or after the cough.  She continues to have shortness of breath with minimal exertion.  She will feel short of breath when walking up stairs.  She mentions that her asthma flares approximately every 6 to 8 months.  She stopped using Qvar  40 mcg 2 puffs once a day after 4 to 5 days use because it caused a raspy voice.  She reports years prior she has been able to be on an inhaler  and has never caused the raspiness to be this bad.  She previously had an appointment to see ear nose and throat, but canceled the appointment because when she stopped the steroid inhaler her voice went back to normal.  She reports that ENT told her that they could schedule an appointment if needed.  Since her last office visit she has received 1 round of steroids and has been using her albuterol  every 4 hours as recommended by our office.  Discussed try using the albuterol  just as needed now.  She reports that the albuterol  helps the wheezing, but does not help the cough.  She has tried Flovent  44 mcg, Qvar  40 mcg Qvar  80 mcg, Symbicort  160/4.5 mcg Flovent  110 mcg, Breo 200 mcg, and Dulera 200 mcg.  She is currently on Tezspire  injections and has received 4 injections.  She reports after the second injection she felt exhausted, but she has not experienced this sensation after the other injections.  Also, she reports that she is even seeing pulmonary for a second opinion due to her breathing.  She did follow-up with cardiology and had an echocardiogram last week.  She has not spoken with the doctor specifically about the results, but mentions they are possibly going to possibly do a stress test.  Her echocardiogram from September 11 shows as below:     Perennial allergic rhinitis: She reports  that last Thursday she had a nosebleed that stopped bleeding on its own.  This occurs maybe once a year.  When this happens she will just stop using her Flonase  nasal spray for a little while.  She also reports some rhinorrhea and maybe a little bit of postnasal drip that is clear in color.  She denies nasal congestion.  She has not been treated for any sinus infections since we last saw her.  She currently takes Claritin  half a tablet once a day and then if her symptoms are bad she will take the Claritin  once a day.  She just cannot take Claritin  1 tablet once a day for very long because it causes a tingling sensation  in her legs and causes her to dry out.  She has tried Zyrtec and Allegra.  She is not certain if she has tried Xyzal .  Reflux: She is currently taking Nexium  40 mg twice a day.  When she is not having a flare she decreases her Nexium  to once a day.  She also takes Pepcid  40 mg at night.  She denies any heartburn or reflux symptoms.   Drug Allergies:  Allergies  Allergen Reactions   Advil [Ibuprofen] Other (See Comments)    Dizzy; any NSAIDS    Naproxen Other (See Comments)   Niacin And Related Other (See Comments)    dizzy   Red Yeast Rice [Cholestin] Other (See Comments)    dizzy    Review of Systems: Negative except as per HPI  Physical Exam: BP 100/70   Pulse 81   Temp 97.8 F (36.6 C)   SpO2 97%    Physical Exam Constitutional:      Appearance: Normal appearance.  HENT:     Head: Normocephalic and atraumatic.     Comments: Pharynx normal, eyes normal, ears normal, nose normal    Right Ear: Tympanic membrane, ear canal and external ear normal.     Left Ear: Tympanic membrane, ear canal and external ear normal.     Nose: Nose normal.     Mouth/Throat:     Mouth: Mucous membranes are moist.     Pharynx: Oropharynx is clear.  Eyes:     Conjunctiva/sclera: Conjunctivae normal.  Cardiovascular:     Rate and Rhythm: Normal rate and regular rhythm.  Pulmonary:     Effort: Pulmonary effort is normal.     Breath sounds: Normal breath sounds.     Comments: Lungs clear to auscultation Skin:    General: Skin is warm.  Neurological:     Mental Status: She is alert and oriented to person, place, and time.  Psychiatric:        Mood and Affect: Mood normal.        Behavior: Behavior normal.        Thought Content: Thought content normal.        Judgment: Judgment normal.     Diagnostics: FVC 2.63 L (95%), FEV1 2.09 L (97%), FEV1/FVC 0.79.  Spirometry indicates normal spirometry.  4 puffs of Xopenex  given.  Postbronchodilator response shows FVC 2.66 L (97%), FEV1 2.30 L  (106%), FEV1/FVC 0.86.  Spirometry indicates normal spirometry.  There is a 10% change in FEV1.  Assessment and Plan: 1. Severe persistent asthma with (acute) exacerbation   2. Perennial allergic rhinitis   3. LPRD (laryngopharyngeal reflux disease)   4. DOE (dyspnea on exertion)   5. Cough, unspecified type     Meds ordered this encounter  Medications   ciclesonide  (  ALVESCO ) 80 MCG/ACT inhaler    Sig: Inhale 2 puffs once a day with spacer to help prevent cough and wheeze. Rinse mouth out after    Dispense:  1 each    Refill:  5    Dispense brand name   predniSONE  (DELTASONE ) 10 MG tablet    Sig: Take 2 tablets twice a day for 3 days, then on the 4th day take 2 tablets in the morning, and on the 5th day take one tablet and stop    Dispense:  15 tablet    Refill:  0    Patient Instructions    1. Continue to treat and prevent inflammation:   A. Flonase  1-2 sprays each nostril one time per day  B. Montelukast  10mg  one time per day  C. Tezepelumab  injections every 4 weeks  Stop Qvar  40   D. START Alvesco  80 - 2 inhalations 1 time per day (empty lungs) with spacer rinse mouth out after.  Let me know if you are not able to get this medication  2.  Continue to treat and prevent reflux/LPR:     A. Nexium  40 mg - 1-2 times per day + famotidine  40 mg - in evening  B. Minimize caffeine and chocolate consumption  3. If needed:   A. Claritin  10 mg - 1-2 times per day  B. Mucinex DM - 2 times per day  C. Albuterol  + Fluticasone  44 - 2 inhalations every 4-6 hours  4. Evaluation for dyspnea on exertion:   A. Obtain stress test with cardiology  B. Continue to follow up with cardiology  5. Return to clinic in 4 weeks with Dr. Kozlow or earlier if problem  6.  Recommend scheduling an appointment with ENT to discuss hoarseness/raspy voice.  Let us  know if you would need a referral and we will be glad to help  7.  Start prednisone  10 mg taking 2 tablets twice a day for 3 days, then  on the fourth day take 2 tablets in the morning, and on the fifth day take 1 tablet and stop  Influenza = Tamiflu. Covid = Paxlovid         No follow-ups on file.    Thank you for the opportunity to care for this patient.  Please do not hesitate to contact me with questions.  Wanda Craze, FNP [facility] 

## 2024-02-10 ENCOUNTER — Telehealth (INDEPENDENT_AMBULATORY_CARE_PROVIDER_SITE_OTHER): Payer: Self-pay | Admitting: Otolaryngology

## 2024-02-10 ENCOUNTER — Ambulatory Visit (INDEPENDENT_AMBULATORY_CARE_PROVIDER_SITE_OTHER)

## 2024-02-10 DIAGNOSIS — J4550 Severe persistent asthma, uncomplicated: Secondary | ICD-10-CM

## 2024-02-10 NOTE — Telephone Encounter (Signed)
 Lvm to see it pt wanted to come I sooner

## 2024-02-12 ENCOUNTER — Encounter: Payer: Self-pay | Admitting: Allergy and Immunology

## 2024-02-12 MED ORDER — ASMANEX HFA 100 MCG/ACT IN AERO: INHALATION_SPRAY | RESPIRATORY_TRACT | 5 refills | Status: DC

## 2024-02-17 ENCOUNTER — Ambulatory Visit (INDEPENDENT_AMBULATORY_CARE_PROVIDER_SITE_OTHER): Admitting: Otolaryngology

## 2024-02-17 ENCOUNTER — Encounter (INDEPENDENT_AMBULATORY_CARE_PROVIDER_SITE_OTHER): Payer: Self-pay | Admitting: Otolaryngology

## 2024-02-17 VITALS — BP 130/82 | HR 80

## 2024-02-17 DIAGNOSIS — R0982 Postnasal drip: Secondary | ICD-10-CM | POA: Diagnosis not present

## 2024-02-17 DIAGNOSIS — R490 Dysphonia: Secondary | ICD-10-CM

## 2024-02-17 DIAGNOSIS — R053 Chronic cough: Secondary | ICD-10-CM | POA: Diagnosis not present

## 2024-02-17 DIAGNOSIS — J3089 Other allergic rhinitis: Secondary | ICD-10-CM | POA: Diagnosis not present

## 2024-02-17 DIAGNOSIS — R0981 Nasal congestion: Secondary | ICD-10-CM

## 2024-02-17 DIAGNOSIS — K219 Gastro-esophageal reflux disease without esophagitis: Secondary | ICD-10-CM

## 2024-02-17 DIAGNOSIS — R0989 Other specified symptoms and signs involving the circulatory and respiratory systems: Secondary | ICD-10-CM

## 2024-02-17 DIAGNOSIS — J383 Other diseases of vocal cords: Secondary | ICD-10-CM

## 2024-02-17 MED ORDER — BENZONATATE 100 MG PO CAPS: 100.0000 mg | ORAL_CAPSULE | Freq: Three times a day (TID) | ORAL | 1 refills | Status: AC | PRN

## 2024-02-17 MED ORDER — LEVOCETIRIZINE DIHYDROCHLORIDE 5 MG PO TABS: 5.0000 mg | ORAL_TABLET | Freq: Every evening | ORAL | 3 refills | Status: DC

## 2024-02-17 NOTE — Patient Instructions (Addendum)
 [facility] Med Nasal Saline Rinse   - start nasal saline rinses with NeilMed Bottle available over the counter or online to help with nasal congestion      GamingLesson[employer][email or web address] - check out this website to learn more about reflux   -Avoid lying down for at least two hours after a meal or after drinking acidic beverages, like soda, or other caffeinated beverages. This can help to prevent stomach contents from flowing back into the esophagus. -Keep your head elevated while you sleep. Using an extra pillow or two can also help to prevent reflux. -Eat smaller and more frequent meals each day instead of a few large meals. This promotes digestion and can aid in preventing heartburn. -Wear loose-fitting clothes to ease pressure on the stomach, which can worsen heartburn and reflux. -Reduce excess weight around the midsection. This can ease pressure on the stomach. Such pressure can force some stomach contents back up the esophagus  - Take Reflux Gourmet (natural supplement available on [employer]) to help with symptoms of chronic throat irritation      - call for speech therapy referral  Superior laryngeal nerve block for chronic cough, throat clearing, or pain  Some patients have symptoms from inflammation or hypersensitivity of the nerve that provides sensation to the inside of the throat. This nerve enters the throat right above the Adam's apple, and there is one on each side. It is called the superior laryngeal nerve.  An injection of lidocaine  and steroid can be given to this area where the sensory nerve enters the throat.  This sometimes helps patients with an oversensitive nerve or cough reflex. It resets an abnormal cough threshold or overactive pain signals from the throat. The steroid acts to decrease any inflammation around the nerve that could be adding to this hypersensitivity.  The injection may help right away, or it may take up to a week or two to start working.  If it hasn't helped  at all after 2-3 weeks, Dr Aymara Sassi will often try a second injection.  Some patients with no response to the first injection still have a good response to the second one.  There is no set schedule for repeating these injections.  It is based entirely on how long they help your symptoms.  If you find the injection helpful but the symptoms come back several months later, then you can repeat it at that time.  Some patients never need a second injection.  Side effects from the injection are mostly related to numbness inside the throat.  If this happens, you may feel one or both sides of your throat go numb. This will last about 1 hour, and you should not swallow anything until the sensation returns to normal, usually about an hour.  It is sometimes frightening to patients when the sensation inside their throat changes, but it is not dangerous.  You may choose to stay in our waiting room until the feeling goes away, but this is not a requirement.  If this happens, it will improve gradually over the hour after the procedure. Other risks include accidental injection into the blood vessels, pain, temporary swallowing trouble or voice changes, and side effects related to steroids generally, like raised blood sugar or increased appetite. Many patients do not notice any steroid-related side effects from the injection, but if you have never taken steroids or aren't familiar with their effects, you should ask Dr Janessa Mickle for additional details.  Diabetics shouyld plan to check their blood sugar more frequently in  the 2 days after the injection.  The injection takes approximately 1 minute per side, and\ it is performed during a routine office visit. There are no precautions afterward except for not swallowing for 1 hour if throat numbness occurs. Patients can drive themselves to and from the visit. There is no recovery time, but some patients have mild bruising or tenderness at the injection site.

## 2024-02-17 NOTE — Progress Notes (Signed)
 ENT CONSULT:  Reason for Consult: chronic cough and throat clearing   HPI: Discussed the use of AI scribe software for clinical note transcription with the patient, who gave verbal consent to proceed.  History of Present Illness Brenda Daniel is a 68 year old female with cough variant asthma who presents with persistent cough and throat irritation.  She experiences a persistent cough, particularly when talking extensively, which is necessary for her work. This is accompanied by frequent throat clearing and a raspy voice. The raspiness resolves when she discontinues the use of daily inhalers such as Pulmicort , which she has tried multiple times. She has not yet tried a new inhaler.  Her history of asthma is significant, with cough being the primary symptom. The cough is not constant but worsens with respiratory infections or allergies. She had the flu in January and has had a persistent cough since then. Despite various treatments, the cough persists. She reports having undergone an echocardiogram to workup shortness of breath.   She takes Claritin  daily for allergies and uses Nasonex. She has not had a sinus infection in a long time. She previously underwent allergy shots decades ago. She experiences shortness of breath, especially when talking extensively, and notes that heat exacerbates her symptoms.  She takes prescription Nexium  for reflux, increasing the dose during coughing episodes. She is currently tapering the dose to once in the morning and about three times a week at night. She mentions a rebound effect when reducing the medication too quickly.      Records Reviewed:  Dr Maurilio Office visit 12/24/23 Brenda Daniel presents to this clinic in evaluation of asthma, allergic rhinitis, LPR.  I last saw her in this clinic 12 November 2023.   She is using tezepelumab  along with montelukast  to treat her asthma and she has not been having any coughing and she does not have any shortness of  breath if she is resting.  However, if she exerts herself she gets very short of breath.  She cannot really walk any distance without getting short of breath.  She can perform yoga but this is a very controlled motion and she self regulates how much she can perform secondary to the shortness of breath.  Several years ago she had evaluation with cardiology and everything checked out okay.  This spring she had a chest x-ray which checked out okay.   Her reflux is under very good control at this point in time on her current plan.  She is using her proton pump inhibitor just once a day along with famotidine  at night.   She is limited in her ability to use inhaled steroids as she develops very significant laryngeal myopathy and hoarseness with most of the inhaled steroids but especially with Symbicort .  When she does not use inhaled steroid she does not have any hoarseness.    Past Medical History:  Diagnosis Date   Allergic rhinitis    per Dr. Kozlow   Asthma    per Dr. Maurilio   DJD (degenerative joint disease)    GERD (gastroesophageal reflux disease)    Hyperlipidemia    Hypertension     Past Surgical History:  Procedure Laterality Date   APPENDECTOMY     BRAVO PH STUDY N/A 03/16/2015   Procedure: BRAVO PH STUDY;  Surgeon: Jerrell Sol, MD;  Location: [facility];  Service: Endoscopy;  Laterality: N/A;   CESAREAN SECTION     ESOPHAGOGASTRODUODENOSCOPY N/A 03/16/2015   Procedure: ESOPHAGOGASTRODUODENOSCOPY (EGD);  Surgeon: Jerrell Sol,  MD;  Location: [facility];  Service: Endoscopy;  Laterality: N/A;   IR ANGIO INTRA EXTRACRAN SEL COM CAROTID INNOMINATE BILAT MOD SED  04/24/2019   IR ANGIO VERTEBRAL SEL VERTEBRAL BILAT MOD SED  04/24/2019   IR US  GUIDE VASC ACCESS RIGHT  04/24/2019   MYOMECTOMY     RETINAL LASER PROCEDURE  04/2021   TEMPOROMANDIBULAR JOINT SURGERY Right    TONSILLECTOMY      Family History  Problem Relation Age of Onset   Heart disease Father    Heart  attack Father    Stroke Father    Allergies Sister    Eczema Sister    Asthma Sister    Hypertension Sister    Hypertension Other        x 4 siblings   Heart attack Brother    Hypertension Brother    Heart attack Paternal Grandfather    Hypertension Sister     Social History:  reports that she has never smoked. She has never used smokeless tobacco. She reports current alcohol use. She reports that she does not use drugs.  Allergies:  Allergies  Allergen Reactions   Advil [Ibuprofen] Other (See Comments)    Dizzy; any NSAIDS    Naproxen Other (See Comments)   Niacin And Related Other (See Comments)    dizzy   Red Yeast Rice [Cholestin] Other (See Comments)    dizzy    Medications: I have reviewed the patient's current medications.  The PMH, PSH, Medications, Allergies, and SH were reviewed and updated.  ROS: Constitutional: Negative for fever, weight loss and weight gain. Cardiovascular: Negative for chest pain and dyspnea on exertion. Respiratory: Is not experiencing shortness of breath at rest. Gastrointestinal: Negative for nausea and vomiting. Neurological: Negative for headaches. Psychiatric: The patient is not nervous/anxious  Blood pressure 130/82, pulse 80, SpO2 94%. There is no height or weight on file to calculate BMI.  PHYSICAL EXAM:  Exam: General: Well-developed, well-nourished Respiratory Respiratory effort: Equal inspiration and expiration without stridor Cardiovascular Peripheral Vascular: Warm extremities with equal color/perfusion Eyes: No nystagmus with equal extraocular motion bilaterally Neuro/Psych/Balance: Patient oriented to person, place, and time; Appropriate mood and affect; Gait is intact with no imbalance; Cranial nerves I-XII are intact Head and Face Inspection: Normocephalic and atraumatic without mass or lesion Palpation: Facial skeleton intact without bony stepoffs Salivary Glands: No mass or tenderness Facial Strength: Facial  motility symmetric and full bilaterally ENT Pinna: External ear intact and fully developed External canal: Canal is patent with intact skin Tympanic Membrane: Clear and mobile External Nose: No scar or anatomic deformity Internal Nose: Septum is straight. No polyp, or purulence. Mucosal edema and erythema present.  Bilateral inferior turbinate hypertrophy.  Lips, Teeth, and gums: Mucosa and teeth intact and viable TMJ: No pain to palpation with full mobility Oral cavity/oropharynx: No erythema or exudate, no lesions present Nasopharynx: No mass or lesion with intact mucosa Hypopharynx: Intact mucosa without pooling of secretions Larynx Glottic: Full true vocal cord mobility without lesion or mass Supraglottic: Normal appearing epiglottis and AE folds Interarytenoid Space: Moderate pachydermia&edema Subglottic Space: Patent without lesion or edema Neck Neck and Trachea: Midline trachea without mass or lesion Thyroid: No mass or nodularity Lymphatics: No lymphadenopathy  Procedure: Preoperative diagnosis: chronic cough and throat clearing  Postoperative diagnosis:   Same  Procedure: Flexible fiberoptic laryngoscopy  Surgeon: Elena Larry, MD  Anesthesia: Topical lidocaine  and Afrin Complications: None Condition is stable throughout exam  Indications and consent:  The patient presents  to the clinic with above symptoms. Indirect laryngoscopy view was incomplete. Thus it was recommended that they undergo a flexible fiberoptic laryngoscopy. All of the risks, benefits, and potential complications were reviewed with the patient preoperatively and verbal informed consent was obtained.  Procedure: The patient was seated upright in the clinic. Topical lidocaine  and Afrin were applied to the nasal cavity. After adequate anesthesia had occurred, I then proceeded to pass the flexible telescope into the nasal cavity. The nasal cavity was patent without rhinorrhea or polyp. The nasopharynx  was also patent without mass or lesion. The base of tongue was visualized and was normal. There were no signs of pooling of secretions in the piriform sinuses. The true vocal folds were mobile bilaterally. There were no signs of glottic or supraglottic mucosal lesion or mass. There was moderate interarytenoid pachydermia and post cricoid edema. The telescope was then slowly withdrawn and the patient tolerated the procedure throughout.   Studies Reviewed: CXR 09/25/23 Cardiac silhouette is normal in size and configuration. No mediastinal or hilar masses. No evidence of adenopathy.   Lungs are hyperexpanded, but clear. No pleural effusion or pneumothorax.   Skeletal structures are intact.   IMPRESSION: No active cardiopulmonary disease.  Assessment/Plan: Encounter Diagnoses  Name Primary?   Chronic cough Yes   Chronic throat clearing    Dysphonia    Hoarseness    Glottic insufficiency    Age-related vocal fold atrophy    Chronic GERD    Chronic nasal congestion    Post-nasal drip    Environmental and seasonal allergies     Assessment and Plan Assessment & Plan Chronic cough  Chronic cough worse when talking. Flexible scope exam with normal VF movement but incomplete closure and findings of post-nasal drip and changes c/w GERD LPR. Differential includes cough variant asthma vs GERD/LPR vs PND, cannot rule out neurogenic component. Discussed postnasal drip, reflux, and environmental triggers. Consider neurogenic cough if no improvement. Recommended focusing on postnasal drainage and reflux control. - Continue Nasonex nasal spray and Allegra. - Consider nasal saline rinses - Rotate allergy pill every four months between Claritin  and Allegra. - Prescribe cough suppressant medication Tessalon  Pearl to take as needed - Consider voice therapy with a speech therapist in the future for VF atrophy - Consider injection augmentation for vocal cords if no improvement. - consider SLN block if  neurogenic cough suspected in the future  GERD LPR She is currently on Nexium  BID trying to wean. Did not notice improvement - slowly wean off Nexium  -  Reflux Gourmet after meals - diet and lifestyle changes to minimize GERD - Refer to [employer] blog for dietary and lifestyle modifications/reflux cook book  Chronic nasal congestion and post-nasal drainage, environmental allergies Allergic rhinitis managed with Claritin  and Nasonex. No recent sinus infections. Discussed nasal saline rinses and rotating allergy medications. - Continue Claritin  and Nasonex. - Consider nasal saline rinses with a neti pot. - Rotate allergy pill every four months between Claritin  and Allegra.  She will think about speech therapy, SLN Block and will call us  if she decides to proceed      Thank you for allowing me to participate in the care of this patient. Please do not hesitate to contact me with any questions or concerns.   Elena Larry, MD Otolaryngology [facility] Phone: [phone] Fax: [phone]    02/17/2024, 8:31 AM

## 2024-02-19 ENCOUNTER — Ambulatory Visit: Admitting: Family

## 2024-02-24 ENCOUNTER — Encounter (HOSPITAL_COMMUNITY): Payer: Self-pay

## 2024-02-24 ENCOUNTER — Ambulatory Visit: Attending: Internal Medicine | Admitting: Internal Medicine

## 2024-02-24 ENCOUNTER — Encounter: Payer: Self-pay | Admitting: Internal Medicine

## 2024-02-24 VITALS — BP 120/80 | HR 81 | Ht 63.0 in | Wt 181.0 lb

## 2024-02-24 DIAGNOSIS — J4541 Moderate persistent asthma with (acute) exacerbation: Secondary | ICD-10-CM | POA: Diagnosis present

## 2024-02-24 DIAGNOSIS — R002 Palpitations: Secondary | ICD-10-CM | POA: Insufficient documentation

## 2024-02-24 DIAGNOSIS — R0609 Other forms of dyspnea: Secondary | ICD-10-CM | POA: Diagnosis not present

## 2024-02-24 DIAGNOSIS — I10 Essential (primary) hypertension: Secondary | ICD-10-CM | POA: Insufficient documentation

## 2024-02-24 DIAGNOSIS — R600 Localized edema: Secondary | ICD-10-CM | POA: Insufficient documentation

## 2024-02-24 NOTE — Patient Instructions (Signed)
 Medication Instructions:  The current medical regimen is effective;  continue present plan and medications.  *If you need a refill on your cardiac medications before your next appointment, please call your pharmacy*  Testing/Procedures: Your physician has requested that you have a stress echocardiogram. For further information please visit [email or web address] Please follow instruction sheet as given.  Please note: We ask at that you not bring children with you during ultrasound (echo/ vascular) testing. Due to room size and safety concerns, children are not allowed in the ultrasound rooms during exams. Our front office staff cannot provide observation of children in our lobby area while testing is being conducted. An adult accompanying a patient to their appointment will only be allowed in the ultrasound room at the discretion of the ultrasound technician under special circumstances. We apologize for any inconvenience.  Follow-Up: At [facility], you and your health needs are our priority.  As part of our continuing mission to provide you with exceptional heart care, our providers are all part of one team.  This team includes your primary Cardiologist (physician) and Advanced Practice Providers or APPs (Physician Assistants and Nurse Practitioners) who all work together to provide you with the care you need, when you need it.  Your next appointment:   8 - 12 week(s)  Provider:   Soyla DELENA Merck, MD    We recommend signing up for the patient portal called MyChart.  Sign up information is provided on this After Visit Summary.  MyChart is used to connect with patients for Virtual Visits (Telemedicine).  Patients are able to view lab/test results, encounter notes, upcoming appointments, etc.  Non-urgent messages can be sent to your provider as well.   To learn more about what you can do with MyChart, go to [email or web address].

## 2024-02-24 NOTE — Progress Notes (Unsigned)
 Cardiology Office Note:  .   Date:  02/24/2024  ID:  Brenda Daniel, DOB Nov 29, 1955, MRN [id number] PCP: Teresa Channel, MD  [facility] Providers Cardiologist:  Soyla DELENA Merck, MD    History of Present Illness: .   Brenda Daniel is a 68 y.o. female.  Discussed the use of AI scribe software for clinical note transcription with the patient, who gave verbal consent to proceed.  History of Present Illness Brenda Daniel is a 68 year old female with asthma who presents with shortness of breath and cough. She was referred by Dr. Tiana for evaluation of her shortness of breath.  She experiences significant shortness of breath that has worsened since January, initially occurring during activities like climbing stairs but now present with minimal exertion. This limits her daily activities and requires frequent rest. Her asthma is associated with a persistent cough, and while albuterol  alleviates wheezing, it does not help the cough. Prednisone  reduces the cough but does not eliminate it.  She experiences sporadic palpitations, primarily at night, lasting three to four days at a time. These episodes have been ongoing for months and are not linked to caffeine intake, as she avoids caffeine in the afternoon.  Her past coronary CT was normal, showing no evidence of coronary disease. She is on triamterene/HCTZ and losartan for well-controlled blood pressure. She practices yoga regularly but has had to modify her routine due to increased difficulty and fatigue.    ROS: negative except per HPI above.  Studies Reviewed: .        Results LABS BNP: Normal Blood counts: Normal Electrolytes: Normal Kidney function: Normal Blood sugar: Normal  RADIOLOGY Coronary CT: No coronary disease (2024)  DIAGNOSTIC Echocardiogram: Normal systolic function, mildly impaired diastolic relaxation, no evidence of elevated pressures, strain value technique sensitive Risk  Assessment/Calculations:       Physical Exam:   VS:  BP 120/80   Pulse 81   Ht 5' 3 (1.6 m)   Wt 181 lb (82.1 kg)   BMI 32.06 kg/m    Wt Readings from Last 3 Encounters:  02/24/24 181 lb (82.1 kg)  12/30/23 184 lb (83.5 kg)  12/24/23 187 lb 3.2 oz (84.9 kg)     Physical Exam GENERAL: Alert, cooperative, well developed, no acute distress HEENT: Normocephalic, normal oropharynx, moist mucous membranes CHEST: Clear to auscultation bilaterally, No wheezes, rhonchi, or crackles CARDIOVASCULAR: Normal heart rate and rhythm, S1 and S2 normal without murmurs ABDOMEN: Soft, non-tender, non-distended, without organomegaly, Normal bowel sounds EXTREMITIES: No cyanosis or edema NEUROLOGICAL: Cranial nerves grossly intact, Moves all extremities without gross motor or sensory deficit   ASSESSMENT AND PLAN: .    Assessment and Plan Assessment & Plan Exertional dyspnea  ?HFpEF LE edema Echocardiogram indicated slight impairment in heart relaxation without pressure rise. Coronary CT showed no coronary disease. Differential includes diastolic dysfunction or exercise-related hypertension. Normal BNP, blood counts, electrolytes, kidney function, and blood sugar. Discussed empiric therapy versus diagnostic testing. She preferred diagnostic testing to avoid unnecessary medication. Will consider low dose lasix if needed ,she is already on hydrochlorothiazide. - Schedule exercise echocardiogram on March 13, 2024, to assess heart function under exertion. Diastolic stress test on bike. - Advise on salt intake management, especially during upcoming travel.  Intermittent palpitations Intermittent palpitations primarily at night, possibly correlated with inhaler use. No significant change with cessation of inhalers. - Monitor palpitations, will consider cardiac monitor   Well-controlled essential hypertension Blood pressure well-controlled with triamterene/HCTZ 75/50  mg and losartan. Recent readings  within normal range.  Recording duration: 21 minutes   Informed Consent   Shared Decision Making/Informed Consent The risks [chest pain, shortness of breath, cardiac arrhythmias, dizziness, blood pressure fluctuations, myocardial infarction, stroke/transient ischemic attack, and life-threatening complications (estimated to be 1 in 10,000)], benefits (risk stratification, diagnosing coronary artery disease, treatment guidance) and alternatives of a stress echocardiogram were discussed in detail with Ms. Clingenpeel and she agrees to proceed.       Soyla Merck, MD, FACC

## 2024-02-28 ENCOUNTER — Encounter (HOSPITAL_COMMUNITY): Payer: Self-pay | Admitting: *Deleted

## 2024-03-06 ENCOUNTER — Institutional Professional Consult (permissible substitution) (INDEPENDENT_AMBULATORY_CARE_PROVIDER_SITE_OTHER): Admitting: Otolaryngology

## 2024-03-09 ENCOUNTER — Ambulatory Visit

## 2024-03-09 DIAGNOSIS — J4550 Severe persistent asthma, uncomplicated: Secondary | ICD-10-CM

## 2024-03-10 ENCOUNTER — Ambulatory Visit: Admitting: Family

## 2024-03-10 ENCOUNTER — Ambulatory Visit (INDEPENDENT_AMBULATORY_CARE_PROVIDER_SITE_OTHER): Admitting: Allergy and Immunology

## 2024-03-10 VITALS — BP 114/72 | HR 98 | Temp 97.6°F | Ht 63.0 in | Wt 186.0 lb

## 2024-03-10 DIAGNOSIS — R0609 Other forms of dyspnea: Secondary | ICD-10-CM | POA: Diagnosis not present

## 2024-03-10 DIAGNOSIS — J4550 Severe persistent asthma, uncomplicated: Secondary | ICD-10-CM | POA: Diagnosis not present

## 2024-03-10 DIAGNOSIS — J3089 Other allergic rhinitis: Secondary | ICD-10-CM

## 2024-03-10 DIAGNOSIS — K219 Gastro-esophageal reflux disease without esophagitis: Secondary | ICD-10-CM | POA: Diagnosis not present

## 2024-03-10 NOTE — Progress Notes (Unsigned)
 [place] - [place] - [place] - [place] -    Follow-up Note  Referring Provider: Teresa Channel, MD Primary Provider: Teresa Channel, MD Date of Office Visit: 03/10/2024  Subjective:   Brenda Daniel (DOB: [birth year]) is a 68 y.o. female who [facility] in re-evaluation of the following:  HPI: [name] presents to this clinic in evaluation of asthma, allergic rhinitis, LPR, dyspnea on exertion.  I last saw her in this clinic 24 December 2023.  At this point in time she is really not coughing very much and does not use any short acting bronchodilator.  She still has some throat clearing and some intermittent raspy voice.  When she was placed on Asmanex  by our nurse practitioner she definitely got very raspy voice and had to discontinue this agent.  She has very little issue with her upper airway.  She is still dyspneic when exerting herself.  She is undergoing evaluation with cardiology who identified heart strain and she is scheduled for an exercise echocardiogram.  She visited with ENT who documented laryngeal findings consistent with reflux and she is now on reflux Gourmet in addition to her proton pump inhibitor and H2 receptor blocker.  Allergies as of 03/10/2024       Reactions   Advil [ibuprofen] Other (See Comments)   Dizzy; any NSAIDS    Naproxen Other (See Comments)   Niacin And Related Other (See Comments)   dizzy   Red Yeast Rice [cholestin] Other (See Comments)   dizzy        Medication List    albuterol  (2.5 MG/3ML) 0.083% nebulizer solution Commonly known as: PROVENTIL  Take 3 mLs (2.5 mg total) by nebulization every 4 (four) hours as needed for wheezing or shortness of breath (coughing fits).   albuterol  108 (90 Base) MCG/ACT inhaler Commonly known as: VENTOLIN  HFA Inhale 2 puffs into the lungs every 4 (four) hours as needed for wheezing or shortness of breath. Take with Flovent  44 during flare  ups.   ALLEGRA PO Take by mouth.   Alvesco  80 MCG/ACT inhaler Generic drug: ciclesonide  Inhale 2 puffs once a day with spacer to help prevent cough and wheeze. Rinse mouth out after   Asmanex  HFA 100 MCG/ACT Aero Generic drug: Mometasone  Furoate 2 inhalations 2 times per day   benzonatate  100 MG capsule Commonly known as: TESSALON  Take 1 capsule (100 mg total) by mouth 3 (three) times daily as needed for cough.   Betamethasone Valerate 0.12 % foam Apply 1 Application topically daily as needed.   Ciclopirox 1 % shampoo Apply topically 2 (two) times a week.   esomeprazole  40 MG capsule Commonly known as: NEXIUM  Take 1 capsule (40 mg total) by mouth 2 (two) times daily before a meal. Take one capsule twice daily during flare-up as directed.   famotidine  40 MG tablet Commonly known as: PEPCID  Take 1 tablet (40 mg total) by mouth at bedtime.   fluticasone  50 MCG/ACT nasal spray Commonly known as: FLONASE  Place 1 spray into both nostrils daily.   gelatin 650 MG capsule Take 650 mg by mouth See admin instructions. Mon - Fri   losartan 50 MG tablet Commonly known as: COZAAR Take 50 mg by mouth daily.   montelukast  10 MG tablet Commonly known as: SINGULAIR  Take 1 tablet (10 mg total) by mouth at bedtime.   Nebulizer Misc 1 Device by Does not apply route as directed.   Potassium 99 MG Tabs Take 1 tablet by  mouth at bedtime.   predniSONE  10 MG tablet Commonly known as: DELTASONE  Take 2 tablets twice a day for 3 days, then on the 4th day take 2 tablets in the morning, and on the 5th day take one tablet and stop   Tezspire  210 MG/1. Soaj Generic drug: Tezepelumab -ekko Inject 210 mg into the skin every 28 (twenty-eight) days.   traZODone 50 MG tablet Commonly known as: DESYREL Take 25 mg by mouth at bedtime.   triamterene-hydrochlorothiazide 75-50 MG tablet Commonly known as: MAXZIDE Take 1 tablet by mouth daily.   valACYclovir 500 MG tablet Commonly known  as: VALTREX Take 500 mg by mouth See admin instructions. Take one tablet (500 mg) by mouth daily at bedtime, take four tablets (2000 mg) once as one dose as needed at onset of fever blister   VITAMIN D PO Take 1 tablet by mouth See admin instructions. Take one tablet by mouth daily on Monday thru Friday (skip Saturday and Sunday)    Past Medical History:  Diagnosis Date   Allergic rhinitis    per Dr. Maurilio   Asthma    per Dr. Maurilio   DJD (degenerative joint disease)    GERD (gastroesophageal reflux disease)    Hyperlipidemia    Hypertension     Past Surgical History:  Procedure Laterality Date   APPENDECTOMY     BRAVO PH STUDY N/A 03/16/2015   Procedure: BRAVO PH STUDY;  Surgeon: Jerrell Sol, MD;  Location: [facility];  Service: Endoscopy;  Laterality: N/A;   CESAREAN SECTION     ESOPHAGOGASTRODUODENOSCOPY N/A 03/16/2015   Procedure: ESOPHAGOGASTRODUODENOSCOPY (EGD);  Surgeon: Jerrell Sol, MD;  Location: [facility];  Service: Endoscopy;  Laterality: N/A;   IR ANGIO INTRA EXTRACRAN SEL COM CAROTID INNOMINATE BILAT MOD SED  04/24/2019   IR ANGIO VERTEBRAL SEL VERTEBRAL BILAT MOD SED  04/24/2019   IR US  GUIDE VASC ACCESS RIGHT  04/24/2019   MYOMECTOMY     RETINAL LASER PROCEDURE  04/2021   TEMPOROMANDIBULAR JOINT SURGERY Right    TONSILLECTOMY      Review of systems negative except as noted in HPI / PMHx or noted below:  Review of Systems  Constitutional: Negative.   HENT: Negative.    Eyes: Negative.   Respiratory: Negative.    Cardiovascular: Negative.   Gastrointestinal: Negative.   Genitourinary: Negative.   Musculoskeletal: Negative.   Skin: Negative.   Neurological: Negative.   Endo/Heme/Allergies: Negative.   Psychiatric/Behavioral: Negative.       Objective:   Vitals:   03/10/24 1605  BP: 114/72  Pulse: 98  Temp: 97.6 F (36.4 C)  SpO2: 96%   Height: 5' 3 (160 cm)  Weight: 186 lb (84.4 kg)   Physical Exam Constitutional:       Appearance: She is not diaphoretic.  HENT:     Head: Normocephalic.     Right Ear: Tympanic membrane, ear canal and external ear normal.     Left Ear: Tympanic membrane, ear canal and external ear normal.     Nose: Nose normal. No mucosal edema or rhinorrhea.     Mouth/Throat:     Pharynx: Uvula midline. No oropharyngeal exudate.  Eyes:     Conjunctiva/sclera: Conjunctivae normal.  Neck:     Thyroid: No thyromegaly.     Trachea: Trachea normal. No tracheal tenderness or tracheal deviation.  Cardiovascular:     Rate and Rhythm: Normal rate and regular rhythm.     Heart sounds: Normal heart sounds, S1 normal and  S2 normal. No murmur heard. Pulmonary:     Effort: No respiratory distress.     Breath sounds: Normal breath sounds. No stridor. No wheezing or rales.  Lymphadenopathy:     Head:     Right side of head: No tonsillar adenopathy.     Left side of head: No tonsillar adenopathy.     Cervical: No cervical adenopathy.  Skin:    Findings: No erythema or rash.     Nails: There is no clubbing.  Neurological:     Mental Status: She is alert.     Diagnostics:    Spirometry was performed and demonstrated an FEV1 of 2.15 at 100 % of predicted.  Results of a rhinoscopy performed 17 February 2024 identifies the following:  The nasal cavity was patent without rhinorrhea or polyp. The nasopharynx was also patent without mass or lesion. The base of tongue was visualized and was normal. There were no signs of pooling of secretions in the piriform sinuses. The true vocal folds were mobile bilaterally. There were no signs of glottic or supraglottic mucosal lesion or mass. There was moderate interarytenoid pachydermia and post cricoid edema   Results of a echocardiogram obtained 07 February 2024 identifies the following:   1. Mild decrease in medial e' but with normal left atrial strain. Normal  function but with abnormal GLS strain. Left ventricular ejection fraction,  by estimation,  is 60%. Left ventricular ejection fraction by 3D volume is  59 %. The left ventricle has  normal function. The left ventricle has no regional wall motion  abnormalities. Left ventricular diastolic parameters were normal. The  average left ventricular global longitudinal strain is -14.6 %. The global  longitudinal strain is abnormal.   2. Right ventricle moderately dilated at the mid wall. Right ventricular  systolic function is normal. The right ventricular size is moderately  enlarged. Tricuspid regurgitation signal is inadequate for assessing PA  pressure.   3. Normal LARS 40.0%.   4. The mitral valve is normal in structure. No evidence of mitral valve  regurgitation. No evidence of mitral stenosis.   5. The aortic valve is tricuspid. Aortic valve regurgitation is not  visualized. No aortic stenosis is present.   6. The inferior vena cava is normal in size with greater than 50%  respiratory variability, suggesting right atrial pressure of 3 mmHg.  Comparison(s): Prior images reviewed side by side. Strain is worse from  2021 study, but with only slight change in function. Right ventricle is  larger at the midwall.   Assessment and Plan:   1. Asthma, severe persistent, well-controlled (HCC)   2. Perennial allergic rhinitis   3. LPRD (laryngopharyngeal reflux disease)   4. DOE (dyspnea on exertion)    1. Continue to treat and prevent inflammation:   A. Flonase  1-2 sprays each nostril one time per day  B. Montelukast  10mg  one time per day  C. Tezepelumab  injections every 4 weeks  2.  Continue to treat and prevent reflux/LPR:     A. Nexium  40 mg - 1-2 times per day + famotidine  40 mg - in evening  B. Reflux Gourmet  C. Minimize caffeine and chocolate consumption  D. Replace throat clearing with swallowing/drinking maneuver  3. If needed:   A. Antihistamine   B. Mucinex DM - 2 times per day  C. Albuterol +Asmanex  100 - 2 inhalations TOGETHER every 4-6 hours  D. Nasal  washes  4. Return to clinic in 12 weeks or earlier if problem  5.  Speech therapy???  6. Influenza = Tamiflu. Covid = Paxlovid  Hisako appears to be doing somewhat better with her chronic cough on her current plan of addressing inflammation of her airway and also aggressively treating her LPR.  I did reinforce the need to not throat clear in an attempt to prevent inflammation of her larynx.  She will continue on this very large collection of anti-inflammatory agents for both her upper and lower airway as noted above.  If she continues to have some intermittent raspy voice she should consider visiting with speech therapy.  Concerning her dyspnea on exertion, she is going to follow-up with cardiology for her exercise echocardiogram.  I will see her back in this clinic in 12 weeks or earlier if there is a problem.   Camellia Denis, MD Allergy / Immunology [facility]

## 2024-03-10 NOTE — Patient Instructions (Addendum)
   1. Continue to treat and prevent inflammation:   A. Flonase  1-2 sprays each nostril one time per day  B. Montelukast  10mg  one time per day  C. Tezepelumab  injections every 4 weeks  2.  Continue to treat and prevent reflux/LPR:     A. Nexium  40 mg - 1-2 times per day + famotidine  40 mg - in evening  B. Reflux Gourmet  C. Minimize caffeine and chocolate consumption  D. Replace throat clearing with swallowing/drinking maneuver  3. If needed:   A. Antihistamine   B. Mucinex DM - 2 times per day  C. Albuterol +Asmanex  100 - 2 inhalations TOGETHER every 4-6 hours  D. Nasal washes  4. Return to clinic in 12 weeks or earlier if problem  5. Speech therapy???  6. Influenza = Tamiflu. Covid = Paxlovid

## 2024-03-11 ENCOUNTER — Encounter: Payer: Self-pay | Admitting: Allergy and Immunology

## 2024-03-11 MED ORDER — ASMANEX HFA 100 MCG/ACT IN AERO: INHALATION_SPRAY | RESPIRATORY_TRACT | 2 refills | Status: DC

## 2024-03-11 MED ORDER — FAMOTIDINE 40 MG PO TABS
40.0000 mg | ORAL_TABLET | Freq: Every evening | ORAL | 1 refills | Status: AC
Start: 2024-03-11 — End: ?

## 2024-03-11 MED ORDER — MONTELUKAST SODIUM 10 MG PO TABS: 10.0000 mg | ORAL_TABLET | Freq: Every evening | ORAL | 1 refills | Status: DC

## 2024-03-11 MED ORDER — ALBUTEROL SULFATE HFA 108 (90 BASE) MCG/ACT IN AERS: 2.0000 | INHALATION_SPRAY | RESPIRATORY_TRACT | 1 refills | Status: DC | PRN

## 2024-03-11 MED ORDER — ESOMEPRAZOLE MAGNESIUM 40 MG PO CPDR: 40.0000 mg | DELAYED_RELEASE_CAPSULE | Freq: Two times a day (BID) | ORAL | 2 refills | Status: AC

## 2024-03-11 MED ORDER — FLUTICASONE PROPIONATE 50 MCG/ACT NA SUSP: 1.0000 | Freq: Every day | NASAL | 2 refills | Status: DC

## 2024-03-13 ENCOUNTER — Other Ambulatory Visit: Payer: Self-pay | Admitting: *Deleted

## 2024-03-13 ENCOUNTER — Ambulatory Visit (HOSPITAL_COMMUNITY)
Admission: RE | Admit: 2024-03-13 | Discharge: 2024-03-13 | Disposition: A | Discharge status: Home / Self Care | Source: Ambulatory Visit | Attending: Internal Medicine | Admitting: Internal Medicine

## 2024-03-13 ENCOUNTER — Ambulatory Visit

## 2024-03-13 DIAGNOSIS — R0609 Other forms of dyspnea: Secondary | ICD-10-CM | POA: Diagnosis present

## 2024-03-13 DIAGNOSIS — I4891 Unspecified atrial fibrillation: Secondary | ICD-10-CM | POA: Insufficient documentation

## 2024-03-13 LAB — ECHOCARDIOGRAM STRESS TEST: Area-P 1/2: 4.36 cm2

## 2024-03-13 MED ORDER — METOPROLOL TARTRATE 25 MG PO TABS: ORAL_TABLET | ORAL | 1 refills | Status: AC

## 2024-03-13 NOTE — Progress Notes (Unsigned)
 Enrolled patient for a 14 day Zio XT  monitor to be mailed to patients home

## 2024-03-17 ENCOUNTER — Ambulatory Visit: Payer: Medicare Other | Admitting: Allergy and Immunology

## 2024-04-03 ENCOUNTER — Encounter: Payer: Self-pay | Admitting: Internal Medicine

## 2024-04-06 ENCOUNTER — Ambulatory Visit (INDEPENDENT_AMBULATORY_CARE_PROVIDER_SITE_OTHER)

## 2024-04-06 DIAGNOSIS — J4550 Severe persistent asthma, uncomplicated: Secondary | ICD-10-CM

## 2024-04-28 ENCOUNTER — Ambulatory Visit: Payer: Self-pay | Admitting: Internal Medicine

## 2024-04-28 ENCOUNTER — Telehealth: Payer: Self-pay | Admitting: Internal Medicine

## 2024-04-28 DIAGNOSIS — I4891 Unspecified atrial fibrillation: Secondary | ICD-10-CM

## 2024-04-28 NOTE — Telephone Encounter (Signed)
Calling with abnormal results. Please advise  

## 2024-04-28 NOTE — Telephone Encounter (Signed)
 Called and spoke to pt. Monitor results reviewed very briefly.   She would like to see Dr. Acharya before starting Eliquis. She may even bring her husband to [facility] to discuss medication. Will OB with Acharya on 05/08/24 at 7:40 am (okay per GA, MD)

## 2024-04-28 NOTE — Telephone Encounter (Signed)
   Cardiac Monitor Alert  Date of alert:  04/28/2024   Patient Name: Brenda Daniel  DOB: 1955-09-10  MRN: [id number]   [facility] Cardiologist: Soyla DELENA Merck, MD  [facility] EP:  None    Monitor Information: Cardiac Event Monitor [[employer]]  Reason:  Atrial fibrillation Ordering provider:  Dr. Merck    Alert Atrial Fibrillation/Flutter This is the final zio monitor result alert for this rhythm.  The patient has a hx of Atrial Fibrillation/Flutter.  The patient is not currently on anticoagulation.  Next Cardiology Appointment   Needs to schedule appointment with Dr. Acharya on   The patient could NOT be reached by telephone today.  Left message to return call.  Arrhythmia, symptoms and history reviewed with Dr. Merck.  Plan:  Make appt with Dr. Acharya for 05/12/24 and if patient is okay with start Eliquis 5 mg twice daily then okay to start or if patient wants to wait until appointment then okay as well per verbal from Dr. Acharya.      Lyle KATHEE Rigg, RN  04/28/2024 10:12 AM

## 2024-05-01 NOTE — Telephone Encounter (Signed)
 From 10/07/23 care everywhere notes:   Brenda Daniel completed an adequate HST and she has very mild sleep apnea. The patient has mild symptomatic OSA based on an adequate HST study. Moderate and severe OSA increases one's risk of ASCVD, AF, DM, gout, glaucoma, and metabolic stress; and increases the risk of MVAs and premature death. Mild OSA does not cause an equivalent increase in ASCVD risk but can worsen diseases like HTN, AF and can cause significant symptoms. In addition it can cause significant daytime sleepiness and disrupted sleep. OAT, CPAP and weight loss are the treatment of choice. Avoid drowsy driving at all times.She has some minor symptoms but does not have significant daytime sleepiness. She prefers to side sleep. She would like to lose some weight. She does not tolerate oral appliances well and does not wish to start PAP. We agreed on a 18-month trial of weight loss 10-15 pounds, side sleeping (use of a wedge). She has insomnia and is on maintenance trazodone. We reviewed sleep hygiene and stimulus control. She feels she is getting more adequate sleep than she perceives most of the time. If her symptoms worsen over time, or she has more sleepiness we will regroup re: her therapy choice.

## 2024-05-04 ENCOUNTER — Ambulatory Visit

## 2024-05-04 DIAGNOSIS — J4550 Severe persistent asthma, uncomplicated: Secondary | ICD-10-CM

## 2024-05-08 ENCOUNTER — Ambulatory Visit: Attending: Internal Medicine | Admitting: Internal Medicine

## 2024-05-08 VITALS — BP 127/77 | HR 73 | Ht 63.0 in | Wt 185.6 lb

## 2024-05-08 DIAGNOSIS — J4541 Moderate persistent asthma with (acute) exacerbation: Secondary | ICD-10-CM | POA: Diagnosis not present

## 2024-05-08 DIAGNOSIS — E785 Hyperlipidemia, unspecified: Secondary | ICD-10-CM | POA: Diagnosis not present

## 2024-05-08 DIAGNOSIS — R0609 Other forms of dyspnea: Secondary | ICD-10-CM

## 2024-05-08 DIAGNOSIS — R002 Palpitations: Secondary | ICD-10-CM

## 2024-05-08 DIAGNOSIS — I10 Essential (primary) hypertension: Secondary | ICD-10-CM

## 2024-05-08 DIAGNOSIS — Z7189 Other specified counseling: Secondary | ICD-10-CM | POA: Diagnosis not present

## 2024-05-08 DIAGNOSIS — I480 Paroxysmal atrial fibrillation: Secondary | ICD-10-CM | POA: Diagnosis not present

## 2024-05-08 MED ORDER — APIXABAN 5 MG PO TABS: 5.0000 mg | ORAL_TABLET | Freq: Two times a day (BID) | ORAL | 3 refills | Status: AC

## 2024-05-08 NOTE — Patient Instructions (Signed)
 Medication Instructions:  Start: Apixaban  (Eliquis ) 5 mg, one tablet, two times daily  Lab Work: None  Follow-Up: At [facility], you and your health needs are our priority.  As part of our continuing mission to provide you with exceptional heart care, our providers are all part of one team.  This team includes your primary Cardiologist (physician) and Advanced Practice Providers or APPs (Physician Assistants and Nurse Practitioners) who all work together to provide you with the care you need, when you need it.  Your next appointment:   6 month(s) (We will mail a reminder letter around March 2026, please call for a June 2026 appointment)  Provider:   Gayatri A Acharya, MD   Other Instructions Please call us  or send a MyChart message with any Cardiology related questions/concerns.  [phone].  Thank you!

## 2024-05-08 NOTE — Progress Notes (Signed)
 " Cardiology Office Note:  .   Date:  05/08/2024  ID:  Brenda Daniel, DOB 1955-08-14, MRN [id number] PCP: Teresa Channel, MD  [facility] Providers Cardiologist:  Soyla DELENA Merck, MD    History of Present Illness: .   Brenda Daniel is a 68 y.o. female.  Discussed the use of AI scribe software for clinical note transcription with the patient, who gave verbal consent to proceed.  History of Present Illness Brenda Daniel is a 68 year old female with atrial fibrillation who presents with shortness of breath and palpitations.  She noticed palpitations and a sensation of heart pounding starting in February, first with stair climbing at home. Symptoms occur with exertion such as walking or carrying her granddaughter and can also occur at rest, including walking to the bathroom at night. Heat sometimes worsens symptoms.  She has paroxysmal atrial fibrillation, with 4% AFib burden on a 14-day monitor. Symptomatic episodes of AFib match her fluttering and palpitations. She has not had dramatic heart pounding while lying on her side since the monitoring period.  She uses albuterol  as needed, which worsens her heart pounding. She has not required the prescribed metoprolol  prn.  Her father had carotid plaque and died of a heart attack at 50, and her sister had a mini-stroke. She has high blood pressure and is concerned about stroke risk with AFib.  She has mild sleep apnea. She tries to stay active with yoga and walking but develops shortness of breath with these activities. She has no current wheezing.    ROS: negative except per HPI above.  Studies Reviewed: SABRA   EKG Interpretation Date/Time:  Friday May 08 2024 07:52:32 EST Ventricular Rate:  73 PR Interval:  170 QRS Duration:  88 QT Interval:  390 QTC Calculation: 429 R Axis:   80  Text Interpretation: Normal sinus rhythm Low voltage QRS Nonspecific T wave abnormality When compared with ECG of 30-Dec-2023  10:17, QRS axis Shifted right T wave inversion no longer evident in Inferior leads Nonspecific T wave abnormality no longer evident in Lateral leads Confirmed by Merck Soyla ([id number]) on 05/08/2024 7:56:17 AM    Results Labs LDL: 164 Hemoglobin: Within normal limits  Diagnostic Ambulatory cardiac monitor (04/2024): Atrial fibrillation present 4% of monitoring period (approximately 14 days); remainder in normal sinus rhythm; episodes of atrial fibrillation correlated with reported symptoms; paroxysmal atrial fibrillation documented. Exercise stress test: Atrial fibrillation observed; irregular rhythm without significant tachycardia; no evidence of malignant arrhythmia. Polysomnography (03/2024): Very mild obstructive sleep apnea. Risk Assessment/Calculations:    CHA2DS2-VASc Score = 3   This indicates a 3.2% annual risk of stroke. The patient's score is based upon: CHF History: 0 HTN History: 1 Diabetes History: 0 Stroke History: 0 Vascular Disease History: 0 Age Score: 1 Gender Score: 1      Physical Exam:   VS:  BP 127/77 (BP Location: Left Arm, Patient Position: Sitting, Cuff Size: Large)   Pulse 73   Ht 5' 3 (1.6 m)   Wt 185 lb 9.6 oz (84.2 kg)   SpO2 94%   BMI 32.88 kg/m    Wt Readings from Last 3 Encounters:  05/08/24 185 lb 9.6 oz (84.2 kg)  03/10/24 186 lb (84.4 kg)  02/24/24 181 lb (82.1 kg)     Physical Exam MEASUREMENTS: Height- 5'3. GENERAL: Alert, cooperative, well developed, no acute distress HEENT: Normocephalic, normal oropharynx, moist mucous membranes CHEST: Clear to auscultation bilaterally, no wheezes, rhonchi, or crackles CARDIOVASCULAR: Normal heart  rate and rhythm, S1 and S2 normal without murmurs ABDOMEN: Soft, non-tender, non-distended, without organomegaly, normal bowel sounds EXTREMITIES: No cyanosis or edema NEUROLOGICAL: Cranial nerves grossly intact, moves all extremities without gross motor or sensory deficit   ASSESSMENT AND  PLAN: .    Assessment and Plan Assessment & Plan Paroxysmal atrial fibrillation Episodes symptomatic with palpitations and shortness of breath. CHADS2VASc score of 3 indicates 3-4% annual stroke risk without anticoagulation. Discussed Eliquis  for stroke risk reduction, considering bruising history with aspirin. Explained Eliquis 's mechanism, lower bleeding risk than warfarin, and adherence importance. Discussed side effects, including bleeding risks, and need for immediate medical attention for head trauma or gastrointestinal bleeding. Eliquis  effective within 1.5 days, prevents left atrial appendage clots. - Prescribed Eliquis  5 mg twice daily. - Advised to avoid alcohol while on Eliquis . - Instructed to seek immediate medical attention if experiencing head trauma or signs of gastrointestinal bleeding. - Discussed potential for future use of Watchman device if anticoagulation becomes problematic.  Primary hypertension Contributing to CHADS2VASc score.  Hyperlipidemia LDL cholesterol at 164 mg/dL, increasing cardiovascular risk. Discussed dietary modifications to manage cholesterol levels. - Advised dietary modifications to reduce LDL cholesterol, including increasing protein intake and reducing added sugars and fat intake.  Cardiac risk counseling Discussed dietary changes and exercise for weight loss and health improvement. - Advised dietary modifications focusing on protein intake and reducing added sugars. - Encouraged regular physical activity, including strength training and walking.  Asthma Occasional wheezing and coughing. Discussed albuterol  use for wheezing and potential impact on atrial fibrillation. Emphasized asthma control to prevent shortness of breath exacerbation. - Continue current asthma management with Singulair  and Testfire. - Use albuterol  as needed for wheezing, but avoid if it exacerbates atrial fibrillation symptoms.  Recording duration: 34 minutes  I spent 40  minutes in the care of Brenda Daniel today including reviewing labs (bmet/cbc), reviewing studies (stress echo, ccta, monitor), face to face time discussing treatment options (34 min), and documenting in the encounter.      Soyla Merck, MD, [facility] "

## 2024-05-27 ENCOUNTER — Encounter: Payer: Self-pay | Admitting: Allergy and Immunology

## 2024-06-09 ENCOUNTER — Ambulatory Visit

## 2024-06-09 ENCOUNTER — Ambulatory Visit: Admitting: Allergy and Immunology

## 2024-06-09 ENCOUNTER — Other Ambulatory Visit: Payer: Self-pay

## 2024-06-09 ENCOUNTER — Encounter: Payer: Self-pay | Admitting: Allergy and Immunology

## 2024-06-09 VITALS — BP 118/82 | HR 80 | Temp 98.6°F | Resp 14 | Ht 63.0 in | Wt 185.7 lb

## 2024-06-09 DIAGNOSIS — J3089 Other allergic rhinitis: Secondary | ICD-10-CM

## 2024-06-09 DIAGNOSIS — J4550 Severe persistent asthma, uncomplicated: Secondary | ICD-10-CM | POA: Diagnosis not present

## 2024-06-09 DIAGNOSIS — R0609 Other forms of dyspnea: Secondary | ICD-10-CM

## 2024-06-09 DIAGNOSIS — K219 Gastro-esophageal reflux disease without esophagitis: Secondary | ICD-10-CM

## 2024-06-09 MED ORDER — FLUTICASONE PROPIONATE 50 MCG/ACT NA SUSP: 1.0000 | Freq: Every day | NASAL | 5 refills | Status: AC

## 2024-06-09 MED ORDER — ESOMEPRAZOLE MAGNESIUM 40 MG PO CPDR: DELAYED_RELEASE_CAPSULE | ORAL | 5 refills | Status: AC

## 2024-06-09 MED ORDER — ASMANEX HFA 100 MCG/ACT IN AERO: INHALATION_SPRAY | RESPIRATORY_TRACT | 5 refills | Status: AC

## 2024-06-09 MED ORDER — MONTELUKAST SODIUM 10 MG PO TABS: 10.0000 mg | ORAL_TABLET | Freq: Every evening | ORAL | 1 refills | Status: AC

## 2024-06-09 MED ORDER — ALBUTEROL SULFATE HFA 108 (90 BASE) MCG/ACT IN AERS: 2.0000 | INHALATION_SPRAY | RESPIRATORY_TRACT | 1 refills | Status: AC | PRN

## 2024-06-09 NOTE — Progress Notes (Unsigned)
 "  [place] - [place] - [place] - [place] - [place]   Follow-up Note  Referring Provider: Teresa Channel, MD Primary Provider: Teresa Channel, MD Date of Office Visit: 06/09/2024  Subjective:   Brenda Daniel (DOB: May 14, 1956) is a 69 y.o. female who [facility] in re-evaluation of the following:  HPI: Brenda Daniel presents to this clinic in evaluation of asthma, allergic rhinitis, LPR, and dyspnea on exertion.  I last saw her in this clinic 10 March 2024.  She continues to do very well at this point in time with her respiratory tract with minimal amounts of coughing and not really much shortness of breath other than her dyspnea on exertion and rare requirement for short acting bronchodilator while she continues on a collection of anti-inflammatory agents for her airway including the use of Flonase , montelukast , and anti-TSLP antibody.  And her throat has not been giving her any problem and her reflux has been under very good control.  She discontinued the consumption of her daily wine in the evening that appears to have resulted in very good improvement regarding her reflux and throat issue.  She is using her Nexium  a few times per week at this point and continues on famotidine  in the evening.  She will be starting Eliquis  secondary to intermittent atrial fibrillation documented on a heart monitor and during her echo stress test.  Allergies as of 06/09/2024       Reactions   Advil [ibuprofen] Other (See Comments)   Dizzy; any NSAIDS    Naproxen Other (See Comments)   Niacin And Related Other (See Comments)   dizzy   Red Yeast Rice [cholestin] Other (See Comments)   dizzy        Medication List    albuterol  (2.5 MG/3ML) 0.083% nebulizer solution Commonly known as: PROVENTIL  Take 3 mLs (2.5 mg total) by nebulization every 4 (four) hours as needed for wheezing or shortness of breath (coughing fits).   albuterol  108 (90  Base) MCG/ACT inhaler Commonly known as: VENTOLIN  HFA Inhale 2 puffs into the lungs every 4 (four) hours as needed for wheezing or shortness of breath. Take with Flovent  44 during flare ups.   ALLEGRA PO Take by mouth.   Alvesco  80 MCG/ACT inhaler Generic drug: ciclesonide  Inhale 2 puffs once a day with spacer to help prevent cough and wheeze. Rinse mouth out after   apixaban  5 MG Tabs tablet Commonly known as: ELIQUIS  Take 1 tablet (5 mg total) by mouth 2 (two) times daily.   Asmanex  HFA 100 MCG/ACT Aero Generic drug: Mometasone  Furoate 2 inhalations 2 times per day   benzonatate  100 MG capsule Commonly known as: TESSALON  Take 1 capsule (100 mg total) by mouth 3 (three) times daily as needed for cough.   Betamethasone Valerate 0.12 % foam Apply 1 Application topically daily as needed.   Ciclopirox 1 % shampoo Apply topically 2 (two) times a week.   esomeprazole  40 MG capsule Commonly known as: NEXIUM  Take 1 capsule (40 mg total) by mouth 2 (two) times daily before a meal. Take one capsule twice daily during flare-up as directed.   famotidine  40 MG tablet Commonly known as: PEPCID  Take 1 tablet (40 mg total) by mouth at bedtime.   fluticasone  50 MCG/ACT nasal spray Commonly known as: FLONASE  Place 1 spray into both nostrils daily.   gelatin 650 MG capsule Take 650 mg by mouth See admin instructions. Mon - Fri   losartan 50 MG  tablet Commonly known as: COZAAR Take 50 mg by mouth daily.   metoprolol  tartrate 25 MG tablet Commonly known as: LOPRESSOR  Take 1/2 to 1 tablet twice daily aws needed for palpitations   montelukast  10 MG tablet Commonly known as: SINGULAIR  Take 1 tablet (10 mg total) by mouth at bedtime.   Nebulizer Misc 1 Device by Does not apply route as directed.   Potassium 99 MG Tabs Take 1 tablet by mouth at bedtime.   predniSONE  10 MG tablet Commonly known as: DELTASONE  Take 2 tablets twice a day for 3 days, then on the 4th day take 2  tablets in the morning, and on the 5th day take one tablet and stop   Tezspire  210 MG/1. Soaj Generic drug: Tezepelumab -ekko Inject 210 mg into the skin every 28 (twenty-eight) days.   traZODone 50 MG tablet Commonly known as: DESYREL Take 25 mg by mouth at bedtime.   triamterene-hydrochlorothiazide 75-50 MG tablet Commonly known as: MAXZIDE Take 1 tablet by mouth daily.   valACYclovir 500 MG tablet Commonly known as: VALTREX Take 500 mg by mouth See admin instructions. Take one tablet (500 mg) by mouth daily at bedtime, take four tablets (2000 mg) once as one dose as needed at onset of fever blister   VITAMIN D PO Take 1 tablet by mouth See admin instructions. Take one tablet by mouth daily on Monday thru Friday (skip Saturday and Sunday)    Past Medical History:  Diagnosis Date   Allergic rhinitis    per Dr. Maurilio   Asthma    per Dr. Maurilio   DJD (degenerative joint disease)    GERD (gastroesophageal reflux disease)    Hyperlipidemia    Hypertension     Past Surgical History:  Procedure Laterality Date   APPENDECTOMY     BRAVO PH STUDY N/A 03/16/2015   Procedure: BRAVO PH STUDY;  Surgeon: Jerrell Sol, MD;  Location: [facility];  Service: Endoscopy;  Laterality: N/A;   CESAREAN SECTION     ESOPHAGOGASTRODUODENOSCOPY N/A 03/16/2015   Procedure: ESOPHAGOGASTRODUODENOSCOPY (EGD);  Surgeon: Jerrell Sol, MD;  Location: [facility];  Service: Endoscopy;  Laterality: N/A;   IR ANGIO INTRA EXTRACRAN SEL COM CAROTID INNOMINATE BILAT MOD SED  04/24/2019   IR ANGIO VERTEBRAL SEL VERTEBRAL BILAT MOD SED  04/24/2019   IR US  GUIDE VASC ACCESS RIGHT  04/24/2019   MYOMECTOMY     RETINAL LASER PROCEDURE  04/2021   TEMPOROMANDIBULAR JOINT SURGERY Right    TONSILLECTOMY      Review of systems negative except as noted in HPI / PMHx or noted below:  Review of Systems  Constitutional: Negative.   HENT: Negative.    Eyes: Negative.   Respiratory: Negative.     Cardiovascular: Negative.   Gastrointestinal: Negative.   Genitourinary: Negative.   Musculoskeletal: Negative.   Skin: Negative.   Neurological: Negative.   Endo/Heme/Allergies: Negative.   Psychiatric/Behavioral: Negative.       Objective:   Vitals:   06/09/24 1329  BP: 118/82  Pulse: 80  Resp: 14  Temp: 98.6 F (37 C)  SpO2: 97%   Height: 5' 3 (160 cm)  Weight: 185 lb 11.2 oz (84.2 kg)   Physical Exam Constitutional:      Appearance: She is not diaphoretic.  HENT:     Head: Normocephalic.     Right Ear: Tympanic membrane, ear canal and external ear normal.     Left Ear: Tympanic membrane, ear canal and external ear normal.  Nose: Nose normal. No mucosal edema or rhinorrhea.     Mouth/Throat:     Pharynx: Uvula midline. No oropharyngeal exudate.  Eyes:     Conjunctiva/sclera: Conjunctivae normal.  Neck:     Thyroid: No thyromegaly.     Trachea: Trachea normal. No tracheal tenderness or tracheal deviation.  Cardiovascular:     Rate and Rhythm: Normal rate and regular rhythm.     Heart sounds: Normal heart sounds, S1 normal and S2 normal. No murmur heard. Pulmonary:     Effort: No respiratory distress.     Breath sounds: Normal breath sounds. No stridor. No wheezing or rales.  Lymphadenopathy:     Head:     Right side of head: No tonsillar adenopathy.     Left side of head: No tonsillar adenopathy.     Cervical: No cervical adenopathy.  Skin:    Findings: No erythema or rash.     Nails: There is no clubbing.  Neurological:     Mental Status: She is alert.     Diagnostics: Spirometry was performed and demonstrated an FEV1 of 2.28 at 107 % of predicted.    Assessment and Plan:   1. Asthma, severe persistent, well-controlled (HCC)   2. Perennial allergic rhinitis   3. LPRD (laryngopharyngeal reflux disease)   4. DOE (dyspnea on exertion)    1. Continue to treat and prevent inflammation:   A. Flonase  1-2 sprays each nostril one time per  day  B. Montelukast  10mg  one time per day  C. Tezepelumab  injections every 4 weeks  2.  Continue to treat and prevent reflux/LPR:     A. Nexium  40 mg - 1-2 times per day B. Famotidine  40 mg - in evening  C. Reflux Gourmet  D. Minimize caffeine and chocolate consumption  E. Replace throat clearing with swallowing/drinking maneuver  3. If needed:   A. Antihistamine   B. Mucinex DM - 2 times per day  C. Albuterol +Asmanex  100 - 2 inhalations TOGETHER every 4-6 hours  D. Nasal washes  4. Return to clinic in 6 Months or earlier if problem  5. Influenza = Tamiflu. Covid = Paxlovid remigio  Kade appears to be doing quite well on her current plan regarding her respiratory tract issue which is a combination of both inflammation and reflux induced airway disease.  She still has a dyspnea on exertion which is probably a combination of some cardiac deconditioning, overweight, asthma, and her atrial fibrillation.  But overall she seems to be in a pretty good state at this point and we will assume she will continue to do well and see her back in this clinic in 6 months or earlier if there is a problem.  Camellia Denis, MD Allergy / Immunology [facility] "

## 2024-06-09 NOTE — Patient Instructions (Addendum)
" ° °  1. Continue to treat and prevent inflammation:   A. Flonase  1-2 sprays each nostril one time per day  B. Montelukast  10mg  one time per day  C. Tezepelumab  injections every 4 weeks  2.  Continue to treat and prevent reflux/LPR:     A. Nexium  40 mg - 1-2 times per day B. Famotidine  40 mg - in evening  C. Reflux Gourmet  D. Minimize caffeine and chocolate consumption  E. Replace throat clearing with swallowing/drinking maneuver  3. If needed:   A. Antihistamine   B. Mucinex DM - 2 times per day  C. Albuterol +Asmanex  100 - 2 inhalations TOGETHER every 4-6 hours  D. Nasal washes  4. Return to clinic in 6 Months or earlier if problem  5. Influenza = Tamiflu. Covid = Paxlovid / molnupiravir        "

## 2024-06-10 ENCOUNTER — Encounter: Payer: Self-pay | Admitting: Allergy and Immunology

## 2024-07-07 ENCOUNTER — Ambulatory Visit

## 2024-12-08 ENCOUNTER — Ambulatory Visit: Admitting: Allergy and Immunology
